# Patient Record
Sex: Male | Born: 1965
Health system: Southern US, Community
[De-identification: ages and names within clinical notes are randomized; demographics above are authoritative.]

## PROBLEM LIST (undated history)

## (undated) DIAGNOSIS — F329 Major depressive disorder, single episode, unspecified: Secondary | ICD-10-CM

## (undated) DIAGNOSIS — R519 Headache, unspecified: Secondary | ICD-10-CM

## (undated) DIAGNOSIS — E785 Hyperlipidemia, unspecified: Secondary | ICD-10-CM

## (undated) DIAGNOSIS — K219 Gastro-esophageal reflux disease without esophagitis: Secondary | ICD-10-CM

## (undated) DIAGNOSIS — R51 Headache: Secondary | ICD-10-CM

## (undated) DIAGNOSIS — M25519 Pain in unspecified shoulder: Secondary | ICD-10-CM

## (undated) DIAGNOSIS — F32A Depression, unspecified: Secondary | ICD-10-CM

## (undated) DIAGNOSIS — H52202 Unspecified astigmatism, left eye: Secondary | ICD-10-CM

## (undated) DIAGNOSIS — R5383 Other fatigue: Secondary | ICD-10-CM

## (undated) DIAGNOSIS — M6530 Trigger finger, unspecified finger: Secondary | ICD-10-CM

## (undated) DIAGNOSIS — G4730 Sleep apnea, unspecified: Secondary | ICD-10-CM

## (undated) DIAGNOSIS — M1990 Unspecified osteoarthritis, unspecified site: Secondary | ICD-10-CM

## (undated) DIAGNOSIS — M25569 Pain in unspecified knee: Secondary | ICD-10-CM

## (undated) DIAGNOSIS — J189 Pneumonia, unspecified organism: Secondary | ICD-10-CM

## (undated) DIAGNOSIS — F419 Anxiety disorder, unspecified: Secondary | ICD-10-CM

## (undated) DIAGNOSIS — D649 Anemia, unspecified: Secondary | ICD-10-CM

## (undated) DIAGNOSIS — I10 Essential (primary) hypertension: Secondary | ICD-10-CM

## (undated) DIAGNOSIS — E669 Obesity, unspecified: Secondary | ICD-10-CM

## (undated) HISTORY — DX: Essential (primary) hypertension: I10

## (undated) HISTORY — DX: Other fatigue: R53.83

## (undated) HISTORY — DX: Obesity, unspecified: E66.9

## (undated) HISTORY — DX: Sleep apnea, unspecified: G47.30

## (undated) HISTORY — DX: Pain in unspecified knee: M25.569

## (undated) HISTORY — DX: Trigger finger, unspecified finger: M65.30

## (undated) HISTORY — DX: Pain in unspecified shoulder: M25.519

## (undated) HISTORY — PX: OTHER SURGICAL HISTORY: SHX169

## (undated) HISTORY — DX: Unspecified osteoarthritis, unspecified site: M19.90

## (undated) HISTORY — PX: CARPAL TUNNEL RELEASE: SHX101

## (undated) HISTORY — PX: FRACTURE SURGERY: SHX138

## (undated) HISTORY — PX: KNEE ARTHROSCOPY: SUR90

## (undated) HISTORY — PX: SHOULDER SURGERY: SHX246

## (undated) HISTORY — PX: JOINT REPLACEMENT: SHX530

## (undated) HISTORY — DX: Hyperlipidemia, unspecified: E78.5

---

## 2000-12-23 ENCOUNTER — Ambulatory Visit (HOSPITAL_COMMUNITY): Admission: RE | Admit: 2000-12-23 | Discharge: 2000-12-23 | Payer: Self-pay | Admitting: Internal Medicine

## 2000-12-23 ENCOUNTER — Encounter: Payer: Self-pay | Admitting: Internal Medicine

## 2001-02-01 ENCOUNTER — Emergency Department (HOSPITAL_COMMUNITY): Admission: EM | Admit: 2001-02-01 | Discharge: 2001-02-01 | Payer: Self-pay | Admitting: Emergency Medicine

## 2001-02-01 ENCOUNTER — Encounter: Payer: Self-pay | Admitting: Emergency Medicine

## 2004-06-23 HISTORY — PX: SHOULDER SURGERY: SHX246

## 2004-11-08 ENCOUNTER — Ambulatory Visit (HOSPITAL_COMMUNITY): Admission: RE | Admit: 2004-11-08 | Discharge: 2004-11-08 | Payer: Self-pay | Admitting: Family Medicine

## 2009-06-23 HISTORY — PX: GASTRIC BYPASS: SHX52

## 2013-10-11 ENCOUNTER — Encounter: Payer: Self-pay | Admitting: Neurology

## 2013-10-12 ENCOUNTER — Ambulatory Visit (INDEPENDENT_AMBULATORY_CARE_PROVIDER_SITE_OTHER): Payer: BC Managed Care – PPO | Admitting: Neurology

## 2013-10-12 ENCOUNTER — Encounter: Payer: Self-pay | Admitting: Neurology

## 2013-10-12 ENCOUNTER — Encounter (INDEPENDENT_AMBULATORY_CARE_PROVIDER_SITE_OTHER): Payer: Self-pay

## 2013-10-12 VITALS — BP 129/78 | HR 60 | Temp 98.2°F | Ht 71.0 in | Wt 263.0 lb

## 2013-10-12 DIAGNOSIS — F1920 Other psychoactive substance dependence, uncomplicated: Secondary | ICD-10-CM

## 2013-10-12 DIAGNOSIS — F1120 Opioid dependence, uncomplicated: Secondary | ICD-10-CM

## 2013-10-12 DIAGNOSIS — G4733 Obstructive sleep apnea (adult) (pediatric): Secondary | ICD-10-CM

## 2013-10-12 DIAGNOSIS — G2581 Restless legs syndrome: Secondary | ICD-10-CM

## 2013-10-12 DIAGNOSIS — R400 Somnolence: Secondary | ICD-10-CM

## 2013-10-12 DIAGNOSIS — G4710 Hypersomnia, unspecified: Secondary | ICD-10-CM

## 2013-10-12 DIAGNOSIS — E669 Obesity, unspecified: Secondary | ICD-10-CM

## 2013-10-12 NOTE — Patient Instructions (Addendum)
Based on your symptoms and your exam I believe you are at risk for obstructive sleep apnea or OSA, and I think we should proceed with a sleep study to determine whether you do or do not have OSA and how severe it is. If you have more than mild OSA, I want you to consider treatment with CPAP. Please remember, the risks and ramifications of moderate to severe obstructive sleep apnea or OSA are: Cardiovascular disease, including congestive heart failure, stroke, difficult to control hypertension, arrhythmias, and even type 2 diabetes has been linked to untreated OSA. Sleep apnea causes disruption of sleep and sleep deprivation in most cases, which, in turn, can cause recurrent headaches, problems with memory, mood, concentration, focus, and vigilance. Most people with untreated sleep apnea report excessive daytime sleepiness, which can affect their ability to drive. Please do not drive if you feel sleepy.  I will see you back after your sleep study to go over the test results and where to go from there. We will call you after your sleep study and to set up an appointment at the time.   Bring all your night time medications.  

## 2013-10-12 NOTE — Progress Notes (Signed)
Subjective:    Patient ID: Mark [name] is a 48 y.o. male.  HPI    Huston Foley, MD, PhD [facility] [address], [address] [address] [place], [place] [address]  Dear Dr. Regino Schultze,   I saw your patient, Mark [name], upon your kind request in my neurologic clinic today for initial consultation of his prior diagnosis of sleep apnea. The patient is unaccompanied today. As you know, Mark [name] is a very pleasant 48 year old right-handed gentleman with an underlying medical history of chronic pain on Suboxone d/t narcotic addiction, allergic rhinitis, restless leg syndrome, depression and anxiety, vitamin D deficiency and morbid obesity who underwent gastric bypass surgery in 2011, and since then has been able to lose approximately 200 pounds. He was previously on BiPAP treatment for severe obstructive as well as central sleep apnea and has not been reevaluated for his sleep disordered breathing after his significant weight loss. He does endorse residual snoring and apneic pauses while asleep. He currently is still using a BiPAP machine, and has been able to get a new machine some 8 months ago. He forgot to bring his machine and does not know, what pressure he is on, and I do not have his prior test results for review, his study was in 2008. He has been on suboxone for 9 months and was on narcotics since 2012. He sees a psychiatrist for pain management. He tries to go to bed by 10 PM and wake time is 5 to 5:30 AM. He falls asleep very quickly and is sleepy during the day, with an ESS of 17/24. He has occasional morning headaches. He uses his BiPAP every night. He works from 7 AM to 5 to 6 PM. He does not tend to snore any more. He does have severe R knee pain and has aching in the legs. He tried Requip, but could not tolerate it.  He is a restless sleeper and in the morning, the bed is quite disheveled.   He denies cataplexy, sleep paralysis, hypnagogic or hypnopompic  hallucinations, or sleep attacks. He does not report any vivid dreams, nightmares, dream enactments, or parasomnias, such as sleep walking, but may talk in his sleep. The patient has not had a sleep study or a home sleep test since the original study and was on O2, but this was discontinued after an ONO post-gastric bypass.  He consumes 5 caffeinated beverages per day, usually in the form of coffee, usually not after lunch. His bedroom is usually dark and cool. There is TV in the bedroom and usually it is not on at night.   His Past Medical History Is Significant For: Past Medical History  Diagnosis Date  . Obesity, unspecified   . Unspecified sleep apnea   . Trigger finger (acquired)   . Fatigue     His Past Surgical History Is Significant For: Past Surgical History  Procedure Laterality Date  . Gastric bypass  2011  . Carpal tunnel release Bilateral   . Shoulder surgery Left 2006  . Shoulder surgery Right 2012  . Knee arthroplasty Right 2013    His Family History Is Significant For: Family History  Problem Relation Age of Onset  . Heart failure Mother   . Cancer Father     His Social History Is Significant For: History   Social History  . Marital Status: Married    Spouse Name: N/A    Number of Children: N/A  . Years of Education: N/A   Social  History Main Topics  . Smoking status: Never Smoker   . Smokeless tobacco: None  . Alcohol Use: No  . Drug Use: No  . Sexual Activity: None   Other Topics Concern  . None   Social History Narrative  . None    His Allergies Are:  Allergies  Allergen Reactions  . Asa [Aspirin]     gastric bypass   . Ibuprofen     gastric bypass  . Morphine And Related Nausea And Vomiting  . Penicillins Hives  :   His Current Medications Are:  Outpatient Encounter Prescriptions as of 10/12/2013  Medication Sig  . buprenorphine-naloxone (SUBOXONE) 8-2 MG SUBL SL tablet Place 1 tablet under the tongue daily.  Marland Kitchen. buPROPion  (WELLBUTRIN SR) 150 MG 12 hr tablet Take 150 mg by mouth 2 (two) times daily.  . Calcium Citrate-Vitamin D (CALCIUM CITRATE + PO) Take 2 tablets by mouth 2 (two) times daily.  . citalopram (CELEXA) 20 MG tablet Take 20 mg by mouth daily.  . diclofenac sodium (VOLTAREN) 1 % GEL Apply 2 g topically 4 (four) times daily.  . DULoxetine (CYMBALTA) 60 MG capsule Take 60 mg by mouth daily.  . Ergocalciferol (VITAMIN D2 PO) Take 5,000 mg by mouth 3 (three) times daily.  Marland Kitchen. glucosamine-chondroitin 500-400 MG tablet Take 1 tablet by mouth 3 (three) times daily.  . Iron 66 MG TABS Take 1 tablet by mouth daily.  . Multiple Vitamins-Minerals (MULTIVITAMIN PO) Take 1 tablet by mouth daily.  . Omega-3 Fatty Acids (FISH OIL) 1000 MG CAPS Take 1 capsule by mouth daily.  Marland Kitchen. scopolamine (TRANSDERM-SCOP) 1 MG/3DAYS Place 1 patch onto the skin every 3 (three) days.  . tadalafil (CIALIS) 10 MG tablet Take 10 mg by mouth daily as needed for erectile dysfunction.  . [DISCONTINUED] fluticasone (FLONASE) 50 MCG/ACT nasal spray Place into both nostrils daily.  . [DISCONTINUED] gabapentin (NEURONTIN) 300 MG capsule Take 300 mg by mouth 3 (three) times daily.  . [DISCONTINUED] levofloxacin (LEVAQUIN) 500 MG tablet Take 500 mg by mouth daily.  . [DISCONTINUED] predniSONE (DELTASONE) 5 MG tablet Take 5 mg by mouth daily with breakfast.  . [DISCONTINUED] rOPINIRole (REQUIP) 2 MG tablet Take 2 mg by mouth at bedtime.  :  Review of Systems:  Out of a complete 14 point review of systems, all are reviewed and negative with the exception of these symptoms as listed below:  Review of Systems  Constitutional: Positive for fatigue.  Eyes: Positive for visual disturbance (blurred vision, diplopia).  Respiratory: Positive for apnea (snoring).   Cardiovascular: Negative.   Gastrointestinal: Positive for constipation.  Endocrine: Negative.   Genitourinary: Positive for difficulty urinating.  Musculoskeletal: Positive for  arthralgias, joint swelling and myalgias.  Skin: Negative.   Allergic/Immunologic: Positive for environmental allergies and immunocompromised state (frequent infections).  Neurological: Positive for numbness and headaches.  Hematological: Negative.   Psychiatric/Behavioral: Positive for sleep disturbance (e.d.s., restless leg) and dysphoric mood.    Objective:  Neurologic Exam  Physical Exam Physical Examination:   Filed Vitals:   10/12/13 0826  BP: 129/78  Pulse: 60  Temp: 98.2 F (36.8 C)    General Examination: The patient is a very pleasant 48 y.o. male in no acute distress. He appears well-developed and well-nourished and well groomed. He is overweight.  HEENT: Normocephalic, atraumatic, pupils are equal, round and reactive to light and accommodation. Funduscopic exam is normal with sharp disc margins noted. Extraocular tracking is good without limitation to gaze excursion or nystagmus  noted. Normal smooth pursuit is noted. Hearing is grossly intact. Tympanic membranes are clear bilaterally. Face is symmetric with normal facial animation and normal facial sensation. Speech is clear with no dysarthria noted. There is no hypophonia. There is no lip, neck/head, jaw or voice tremor. Neck is supple with full range of passive and active motion. There are no carotid bruits on auscultation. Oropharynx exam reveals: mild mouth dryness, adequate dental hygiene and moderate airway crowding, due to narrow airway entry, longer uvula and tonsils in place. Mallampati is class II. Tongue protrudes centrally and palate elevates symmetrically. Tonsils are 2+ in size. Neck size is 18.75 inches.   Chest: Clear to auscultation without wheezing, rhonchi or crackles noted.  Heart: S1+S2+0, regular and normal without murmurs, rubs or gallops noted.   Abdomen: Soft, non-tender and non-distended with normal bowel sounds appreciated on auscultation.  Extremities: There is no pitting edema in the distal  lower extremities bilaterally. Pedal pulses are intact.  Skin: Warm and dry without trophic changes noted. There are no varicose veins.  Musculoskeletal: exam reveals no obvious joint deformities, tenderness or joint swelling or erythema, with the exception of right knee pain and bilateral leg aching.   Neurologically:  Mental status: The patient is awake, alert and oriented in all 4 spheres. His immediate and remote memory, attention, language skills and fund of knowledge are appropriate. There is no evidence of aphasia, agnosia, apraxia or anomia. Speech is clear with normal prosody and enunciation. Thought process is linear. Mood is anxious and affect is normal.  Cranial nerves II - XII are as described above under HEENT exam. In addition: shoulder shrug is normal with equal shoulder height noted. Motor exam: Normal bulk, strength and tone is noted. There is no drift, tremor or rebound. Romberg is negative. Reflexes are 2+ throughout. Babinski: Toes are flexor bilaterally. Fine motor skills and coordination: intact with normal finger taps, normal hand movements, normal rapid alternating patting, normal foot taps and normal foot agility.  Cerebellar testing: No dysmetria or intention tremor on finger to nose testing. Heel to shin is unremarkable bilaterally. There is no truncal or gait ataxia.  Sensory exam: intact to light touch, pinprick, vibration, temperature sense in the upper and lower extremities.  Gait, station and balance: He stands with difficulty d/t pain in legs. No veering to one side is noted. No leaning to one side is noted. Posture is age-appropriate and stance is narrow based. Gait shows normal stride length and normal pace. No problems turning are noted. He turns en bloc. Tandem walk is very slightly difficult for him. Intact toe and heel stance is noted.               Assessment and Plan:   In summary, Mark [name] is a very pleasant 48 y.o.-year old male  with an underlying  medical history of chronic pain on Suboxone d/t narcotic addiction, allergic rhinitis, restless leg syndrome, depression and anxiety, vitamin D deficiency and morbid obesity,  who was diagnosed in 2008 with obstructive sleep apnea, perhaps also central sleep apnea. Unfortunately, I do not have prior test results available for review. He is on BiPAP treatment and has been able to get a new machine has not had a change in this pressure setting since his significant weight loss after his gastric bypass surgery in 2011. He does endorse significant daytime somnolence. This may be in part due to medication effect. He is advised that he will need a repeat sleep study most likely  in the form of a split-night sleep study. Furthermore, he also endorses restless leg symptoms. He's not sure if he kicks in his sleep. He tried Requip but did not tolerate it. Of note, he is on Cymbalta as well as Celexa. I've advised him to talk to his psychiatrist about potentially consolidating treatment. He is advised to bring all his nighttime medications for the test. I had a long chat with the patient about my findings and the diagnosis of OSA, its prognosis and treatment options. We talked about medical treatments, surgical interventions and non-pharmacological approaches. I explained in particular the risks and ramifications of untreated moderate to severe OSA, especially with respect to developing cardiovascular disease down the Road, including congestive heart failure, difficult to treat hypertension, cardiac arrhythmias, or stroke. Even type 2 diabetes has, in part, been linked to untreated OSA. Symptoms of untreated OSA include daytime sleepiness, memory problems, mood irritability and mood disorder such as depression and anxiety, lack of energy, as well as recurrent headaches, especially morning headaches. We talked about trying to maintain a healthy lifestyle in general, as well as the importance of weight control. I encouraged the  patient to eat healthy, exercise daily and keep well hydrated, to keep a scheduled bedtime and wake time routine, to not skip any meals and eat healthy snacks in between meals. I advised the patient not to drive when feeling sleepy. I recommended the following at this time: sleep study with potential positive airway pressure titration.  I explained the sleep test procedure to the patient and also outlined possible surgical and non-surgical treatment options of OSA, including the use of a custom-made dental device (which would require a referral to a specialist dentist or oral surgeon), upper airway surgical options, such as pillar implants, radiofrequency surgery, tongue base surgery, and UPPP (which would involve a referral to an ENT surgeon). Rarely, jaw surgery such as mandibular advancement may be considered.  I also explained the CPAP treatment option to the patient, who indicated that he would be willing to try CPAP or BiPAP if the need arises. I explained the importance of being compliant with PAP treatment, not only for insurance purposes but primarily to improve His symptoms, and for the patient's long term health benefit, including to reduce His cardiovascular risks. I answered all his questions today and the patient was in agreement. I would like to see him back after the sleep study is completed and encouraged him to call with any interim questions, concerns, problems or updates.   Thank you very much for allowing me to participate in the care of this nice patient. If I can be of any further assistance to you please do not hesitate to call me at [phone].  Sincerely,   Huston Foley, MD, PhD

## 2013-11-16 ENCOUNTER — Ambulatory Visit (INDEPENDENT_AMBULATORY_CARE_PROVIDER_SITE_OTHER): Payer: BC Managed Care – PPO | Admitting: Neurology

## 2013-11-16 DIAGNOSIS — F1120 Opioid dependence, uncomplicated: Secondary | ICD-10-CM

## 2013-11-16 DIAGNOSIS — G4733 Obstructive sleep apnea (adult) (pediatric): Secondary | ICD-10-CM

## 2013-11-16 DIAGNOSIS — E669 Obesity, unspecified: Secondary | ICD-10-CM

## 2013-11-30 ENCOUNTER — Telehealth: Payer: Self-pay | Admitting: Neurology

## 2013-11-30 DIAGNOSIS — G4730 Sleep apnea, unspecified: Secondary | ICD-10-CM

## 2013-11-30 DIAGNOSIS — G4710 Hypersomnia, unspecified: Secondary | ICD-10-CM

## 2013-11-30 DIAGNOSIS — G4733 Obstructive sleep apnea (adult) (pediatric): Secondary | ICD-10-CM

## 2013-11-30 NOTE — Telephone Encounter (Signed)
I called and left a message for the patient about his recent sleep study results. I informed the patient that the study confirmed the diagnosis of obstructive sleep apnea and that he did well on CPAP during the night of the study with significant improvement of respiratory events. Dr. Frances Furbish recommend starting CPAP therapy at home. I will send the order to a durable medical equipment company of the patient choice. I will fax a copy to Dr. Lorie Apley office and mail a copy to the patient along with a follow up instruction letter.

## 2013-11-30 NOTE — Telephone Encounter (Signed)
Please call and notify patient that the recent sleep study confirmed the diagnosis of OSA. He did very well with CPAP during the study with significant improvement of the respiratory events. Therefore, I would like start the patient on CPAP at home. I placed the order in the chart.   Arrange for CPAP set up at home through a DME company of patient's choice and fax/route report to PCP and referring MD (if other than PCP).   The patient will also need a follow up appointment with me in 6-8 weeks post set up that has to be scheduled; help the patient schedule this (in a follow-up slot).   Please re-enforce the importance of compliance with treatment and the need for us to monitor compliance data.   Once you have spoken to the patient and scheduled the return appointment, you may close this encounter, thanks,   Terilyn Sano, MD, PhD [facility]    

## 2013-12-08 ENCOUNTER — Encounter: Payer: Self-pay | Admitting: *Deleted

## 2013-12-08 NOTE — Telephone Encounter (Signed)
I called and spoke with the patient to get the name of the DME company he would like to send his order. I also gave him the results of his sleep study report.

## 2014-06-05 ENCOUNTER — Other Ambulatory Visit (HOSPITAL_COMMUNITY): Payer: Self-pay | Admitting: Internal Medicine

## 2014-06-05 DIAGNOSIS — L720 Epidermal cyst: Secondary | ICD-10-CM

## 2014-06-05 DIAGNOSIS — N5089 Other specified disorders of the male genital organs: Secondary | ICD-10-CM

## 2014-06-07 ENCOUNTER — Ambulatory Visit (HOSPITAL_COMMUNITY): Admission: RE | Admit: 2014-06-07 | Payer: BC Managed Care – PPO | Source: Ambulatory Visit

## 2014-06-08 ENCOUNTER — Ambulatory Visit (HOSPITAL_COMMUNITY)
Admission: RE | Admit: 2014-06-08 | Discharge: 2014-06-08 | Disposition: A | Discharge status: Home / Self Care | Payer: BC Managed Care – PPO | Source: Ambulatory Visit | Attending: Internal Medicine | Admitting: Internal Medicine

## 2014-06-08 DIAGNOSIS — N433 Hydrocele, unspecified: Secondary | ICD-10-CM | POA: Insufficient documentation

## 2014-06-08 DIAGNOSIS — N508 Other specified disorders of male genital organs: Secondary | ICD-10-CM | POA: Diagnosis present

## 2014-06-08 DIAGNOSIS — L720 Epidermal cyst: Secondary | ICD-10-CM

## 2014-06-08 DIAGNOSIS — N5089 Other specified disorders of the male genital organs: Secondary | ICD-10-CM

## 2014-06-14 ENCOUNTER — Encounter: Payer: Self-pay | Admitting: *Deleted

## 2014-06-14 DIAGNOSIS — E785 Hyperlipidemia, unspecified: Secondary | ICD-10-CM

## 2014-06-14 DIAGNOSIS — M170 Bilateral primary osteoarthritis of knee: Secondary | ICD-10-CM

## 2014-06-14 DIAGNOSIS — M1990 Unspecified osteoarthritis, unspecified site: Secondary | ICD-10-CM | POA: Insufficient documentation

## 2014-06-14 DIAGNOSIS — G8929 Other chronic pain: Secondary | ICD-10-CM

## 2014-06-14 DIAGNOSIS — R079 Chest pain, unspecified: Secondary | ICD-10-CM | POA: Insufficient documentation

## 2014-06-14 DIAGNOSIS — G4730 Sleep apnea, unspecified: Secondary | ICD-10-CM | POA: Insufficient documentation

## 2014-06-14 DIAGNOSIS — R0789 Other chest pain: Secondary | ICD-10-CM | POA: Insufficient documentation

## 2014-06-14 DIAGNOSIS — I10 Essential (primary) hypertension: Secondary | ICD-10-CM

## 2014-06-19 ENCOUNTER — Ambulatory Visit (INDEPENDENT_AMBULATORY_CARE_PROVIDER_SITE_OTHER): Payer: BC Managed Care – PPO | Admitting: Internal Medicine

## 2014-06-19 ENCOUNTER — Encounter: Payer: Self-pay | Admitting: Internal Medicine

## 2014-06-19 VITALS — BP 128/80 | HR 76 | Ht 70.0 in | Wt 290.6 lb

## 2014-06-19 DIAGNOSIS — I10 Essential (primary) hypertension: Secondary | ICD-10-CM

## 2014-06-19 NOTE — Progress Notes (Signed)
HPI Patinet is a 48 yo who is referred for evaluation of CP  Patinet has sleep apnea  Uses CPAP   Before when woke up (prior to CPAP) would wake up with CP  With CPAP he has intermittent pain.  SOmetimes lasts a few hours  Dull  Not bad   Happesn duringday  Can occur with and without activty Says he is fairly active.  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   Had gastric bypass. Allergies  Allergen Reactions  . Asa [Aspirin]     gastric bypass   . Ibuprofen     gastric bypass  . Morphine And Related Nausea And Vomiting  . Penicillins Hives    Current Outpatient Prescriptions  Medication Sig Dispense Refill  . buprenorphine-naloxone (SUBOXONE) 8-2 MG SUBL SL tablet Place 1 tablet under the tongue daily.    Marland Kitchen. buPROPion (WELLBUTRIN SR) 150 MG 12 hr tablet Take 150 mg by mouth 2 (two) times daily.    . Calcium Citrate-Vitamin D (CALCIUM CITRATE + PO) Take 2 tablets by mouth 2 (two) times daily.    . citalopram (CELEXA) 20 MG tablet Take 20 mg by mouth daily.    . diclofenac sodium (VOLTAREN) 1 % GEL Apply 2 g topically 4 (four) times daily.    . DULoxetine (CYMBALTA) 60 MG capsule Take 60 mg by mouth daily.    . Ergocalciferol (VITAMIN D2 PO) Take 5,000 mg by mouth 3 (three) times daily.    Marland Kitchen. glucosamine-chondroitin 500-400 MG tablet Take 1 tablet by mouth 3 (three) times daily.    . Iron 66 MG TABS Take 1 tablet by mouth daily.    . Multiple Vitamins-Minerals (MULTIVITAMIN PO) Take 1 tablet by mouth daily.    . Omega-3 Fatty Acids (FISH OIL) 1000 MG CAPS Take 1 capsule by mouth daily.    Marland Kitchen. scopolamine (TRANSDERM-SCOP) 1 MG/3DAYS Place 1 patch onto the skin every 3 (three) days.    . tadalafil (CIALIS) 10 MG tablet Take 10 mg by mouth daily as needed for erectile dysfunction.     No current facility-administered medications for this visit.    Past Medical History  Diagnosis Date  . Obesity, unspecified   . Unspecified sleep apnea   . Trigger finger (acquired)   . Fatigue     Past  Surgical History  Procedure Laterality Date  . Gastric bypass  2011  . Carpal tunnel release Bilateral   . Shoulder surgery Left 2006  . Shoulder surgery Right 2012  . Knee arthroplasty Right 2013    Family History  Problem Relation Age of Onset  . Heart failure Mother   . Cancer Father     History   Social History  . Marital Status: Married    Spouse Name: N/A    Number of Children: N/A  . Years of Education: N/A   Occupational History  . Not on file.   Social History Main Topics  . Smoking status: Never Smoker   . Smokeless tobacco: Not on file  . Alcohol Use: No  . Drug Use: No  . Sexual Activity: Not on file   Other Topics Concern  . Not on file   Social History Narrative    Review of Systems:  All systems reviewed.  They are negative to the above problem except as previously stated.  Vital Signs: BP 128/80 mmHg  Pulse 76  Ht 5\' 10"  (1.778 m)  Wt 290 lb 9.6 oz (131.815 kg)  BMI 41.70 kg/m2  Physical  Exam Patient is a morbidly obese 48 yo in NAD  HEENT:  Normocephalic, atraumatic. EOMI, PERRLA.  Neck: JVP is normal.  No bruits.  Lungs: clear to auscultation. No rales no wheezes.  Heart: Regular rate and rhythm. Normal S1, S2. No S3.   No significant murmurs. PMI not displaced.  Abdomen:  Supple, nontender. Normal bowel sounds. No masses. No hepatomegaly.  Extremities:   Good distal pulses throughout. No lower extremity edema.  Musculoskeletal :moving all extremities.  Neuro:   alert and oriented x3.  CN II-XII grossly intact.  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  Would continue to use . I would recomm stress testing given above symptoms  I think a false positive is more likely in his case given  size/difficulties  I would not want to go on to cath   I encouraged him to stay active  F/U if symtpoms change  2.  Sleep apnea  Needs to use BiPAP  3.  Morbid obesity  Patinet knows he needs to lose wt.

## 2014-06-19 NOTE — Patient Instructions (Signed)
Your physician recommends that you schedule a follow-up appointment in: only as needed.       Thank you for choosing [facility] !         

## 2014-11-15 HISTORY — PX: KNEE ARTHROSCOPY: SUR90

## 2015-03-05 ENCOUNTER — Other Ambulatory Visit: Payer: Self-pay | Admitting: Orthopedic Surgery

## 2015-03-05 DIAGNOSIS — M25512 Pain in left shoulder: Principal | ICD-10-CM

## 2015-03-05 DIAGNOSIS — M25511 Pain in right shoulder: Secondary | ICD-10-CM

## 2015-03-19 ENCOUNTER — Ambulatory Visit
Admission: RE | Admit: 2015-03-19 | Discharge: 2015-03-19 | Disposition: A | Discharge status: Home / Self Care | Payer: BLUE CROSS/BLUE SHIELD | Source: Ambulatory Visit | Attending: Orthopedic Surgery | Admitting: Orthopedic Surgery

## 2015-03-19 DIAGNOSIS — M25511 Pain in right shoulder: Secondary | ICD-10-CM

## 2015-03-19 DIAGNOSIS — M25512 Pain in left shoulder: Principal | ICD-10-CM

## 2015-04-10 ENCOUNTER — Telehealth: Payer: Self-pay

## 2015-04-10 NOTE — Telephone Encounter (Signed)
PT left VM to call about scheduling colonoscopy.

## 2015-04-11 NOTE — Telephone Encounter (Signed)
Called pt at [phone] and [facility] for a return call.

## 2015-04-11 NOTE — Telephone Encounter (Signed)
Pt called. He is supposed to have left knee replacement soon. 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 He is concerned about anesthesia twice close together. He is on pain meds etc and needs OV. I have scheduled OV with Tana CoastLeslie Lewis, PA for 05/02/2015 at 2:00 PM. I told him if he thinks he will have the other surgery and needs to reschedule that is fine. He said the surgeon that will be doing him is usually scheduled out for about 4 weeks. Routing to [place] to any advise!

## 2015-04-12 NOTE — Telephone Encounter (Signed)
Pt's wife is aware of the appt on 05/02/2015 at 2:00 pm and will call if he needs to do the knee first and wait.

## 2015-04-12 NOTE — Telephone Encounter (Signed)
Needs OV prior to procedure.   Would try to get colonoscopy done before knee replacement if possible or consider waiting until completely recovered.

## 2015-05-02 ENCOUNTER — Ambulatory Visit: Payer: BLUE CROSS/BLUE SHIELD | Admitting: Gastroenterology

## 2015-05-21 ENCOUNTER — Ambulatory Visit: Payer: BLUE CROSS/BLUE SHIELD | Admitting: Gastroenterology

## 2015-06-06 ENCOUNTER — Ambulatory Visit (INDEPENDENT_AMBULATORY_CARE_PROVIDER_SITE_OTHER): Payer: BLUE CROSS/BLUE SHIELD | Admitting: Neurology

## 2015-06-06 ENCOUNTER — Encounter: Payer: Self-pay | Admitting: Neurology

## 2015-06-06 VITALS — BP 148/78 | HR 80 | Resp 18 | Ht 70.0 in | Wt 288.0 lb

## 2015-06-06 DIAGNOSIS — E669 Obesity, unspecified: Secondary | ICD-10-CM

## 2015-06-06 DIAGNOSIS — G4733 Obstructive sleep apnea (adult) (pediatric): Secondary | ICD-10-CM

## 2015-06-06 NOTE — Patient Instructions (Signed)
I will prescribe CPAP again for you. We will fax the order to [facility].   Please use CPAP regularly. While your insurance requires that you use CPAP at least 4 hours each night on 70% of the nights, I recommend, that you not skip any nights and use it throughout the night if you can. Getting used to CPAP and staying with the treatment long term does take time and patience and discipline. Untreated obstructive sleep apnea when it is moderate to severe can have an adverse impact on cardiovascular health and raise her risk for heart disease, arrhythmias, hypertension, congestive heart failure, stroke and diabetes. Untreated obstructive sleep apnea causes sleep disruption, nonrestorative sleep, and sleep deprivation. This can have an impact on your day to day functioning and cause daytime sleepiness and impairment of cognitive function, memory loss, mood disturbance, and problems focussing. Using CPAP regularly can improve these symptoms.

## 2015-06-06 NOTE — Progress Notes (Signed)
Subjective:    Patient ID: Mark [name] is a 49 y.o. male.  HPI     Interim history:   Mark [name] is a very pleasant 49 year old right-handed gentleman with an underlying medical history of chronic pain on Suboxone d/t narcotic addiction, allergic rhinitis, restless leg syndrome, depression and anxiety, vitamin D deficiency and morbid obesity who presents for follow-up consultation of his obstructive sleep apnea. He is re-referred by his primary care physician, Dr. Gerarda Fraction. The patient is unaccompanied today. I first met him on 10/12/2013 at the request of his primary care physician, at which time the patient reported a prior diagnosis of obstructive sleep apnea for which he was on BiPAP therapy and significant weight loss after his gastric bypass surgery in 2011. I invited him back for sleep study for reevaluation of his OSA. He had a split-night sleep study on 11/16/2013 and I went over his test results with him in detail today. Baseline sleep efficiency was 87.2% with a latency to sleep of 11.5 minutes and wake after sleep onset of 7.5 minutes with mild sleep fragmentation noted. He had near absence of slow-wave sleep. REM sleep was 49.8% with a reduced REM latency of 12.5 minutes. He had no significant PLMS, EKG or EEG changes. Mild snoring was noted. He slept mostly on his back. Total AHI was 25.9 per hour, average oxygen saturation 93%, nadir was 85% with time below 90% saturation of 17 minutes and 28 seconds. He was then titrated on CPAP. Sleep efficiency during the treatment portion of the study was 83% with a latency to sleep of 1 minute and wake after sleep onset of 39.5 minutes with mild to moderate sleep fragmentation noted. There was absence of slow-wave sleep and REM was 25.5% with a long REM latency. Average oxygen saturation was 95% with a nadir of 91%. Snoring was eliminated. CPAP was titrated from 5 cm to 7 cm with a reduction in the AHI to 0 per hour on the final pressure. Supine REM  sleep was achieved. Based on his test results I prescribed home CPAP therapy at the pressure of 7 cm. Unfortunately, the patient did not return for follow-up until now.   Today, 06/06/2015: I reviewed his auto BiPAP compliance data from 05/07/2015 through 06/05/2015 which is a total of 30 days during which time he used his machine only 22 days with percent used days greater than 4 hours at 23%, indicating poor compliance. Average AHI 0.5 per hour, leak at times high with the 95th percentile at 33.3 L/m. In the last 90 days from 03/08/2015 through 06/05/2015 he was about 30% compliant with treatment. Leak was also consistently high.  Today, 06/06/2015: He reports difficulty with his BiPAP machine and the mask does not fit well. He never got the CPAP that I ordered for unclear reasons. The patient was contacted in June 2015 and we faxed the order to his DME company at the time. At any rate, he tries to be compliant with his BiPAP but is not fully compliant. He does not sleep very well. He does not wake up well rested. He had a left knee injury in the interim and needs surgery. He also reports right knee pain and bilateral shoulder pain. Unfortunately, since his sleep study from May 2015 he has gained about 25 pounds.  Previously:   10/12/2013: He had significant weight loss after his gastric bypass surgery in 2011, and since then has been able to lose approximately 200 pounds. He was previously on BiPAP  treatment for severe obstructive as well as central sleep apnea and has not been reevaluated for his sleep disordered breathing after his significant weight loss. He does endorse residual snoring and apneic pauses while asleep. He currently is still using a BiPAP machine, and has been able to get a new machine some 8 months ago. He forgot to bring his machine and does not know, what pressure he is on, and I do not have his prior test results for review, his study was in 2008. He has been on suboxone for 9  months and was on narcotics since 2012. He sees a psychiatrist for pain management. He tries to go to bed by 10 PM and wake time is 5 to 5:30 AM. He falls asleep very quickly and is sleepy during the day, with an ESS of 17/24. He has occasional morning headaches. He uses his BiPAP every night. He works from 7 AM to 5 to 6 PM. He does not tend to snore any more. He does have severe R knee pain and has aching in the legs. He tried Requip, but could not tolerate it.   He is a restless sleeper and in the morning, the bed is quite disheveled.    He denies cataplexy, sleep paralysis, hypnagogic or hypnopompic hallucinations, or sleep attacks. He does not report any vivid dreams, nightmares, dream enactments, or parasomnias, such as sleep walking, but may talk in his sleep. The patient has not had a sleep study or a home sleep test since the original study and was on O2, but this was discontinued after an ONO post-gastric bypass.   He consumes 5 caffeinated beverages per day, usually in the form of coffee, usually not after lunch. His bedroom is usually dark and cool. There is TV in the bedroom and usually it is not on at night.   Her Past Medical History Is Significant For: Past Medical History  Diagnosis Date  . Obesity, unspecified   . Unspecified sleep apnea   . Trigger finger (acquired)   . Fatigue   . Osteoarthritis   . Hypertension   . Hyperlipemia   . Knee pain   . Shoulder pain     His Past Surgical History Is Significant For: Past Surgical History  Procedure Laterality Date  . Gastric bypass  2011  . Carpal tunnel release Bilateral   . Shoulder surgery Left 2006  . Shoulder surgery Right 2012  . Knee arthroplasty Right 2013    His Family History Is Significant For: Family History  Problem Relation Age of Onset  . Heart failure Mother   . Diabetes Mother   . COPD Mother   . Cancer Father   . Arthritis Father     His Social History Is Significant For: Social History    Social History  . Marital Status: Married    Spouse Name: N/A  . Number of Children: 3  . Years of Education: HS   Occupational History  . [employer]    Social History Main Topics  . Smoking status: Never Smoker   . Smokeless tobacco: None  . Alcohol Use: 0.0 oz/week    0 Standard drinks or equivalent per week     Comment: 12 drinks a year  . Drug Use: No  . Sexual Activity: Not Asked   Other Topics Concern  . None   Social History Narrative   Drinks 10 cups of coffee     His Allergies Are:  Allergies  Allergen Reactions  .  Asa [Aspirin]     gastric bypass   . Ibuprofen     gastric bypass  . Morphine And Related Nausea And Vomiting  . Penicillins Hives  :   His Current Medications Are:  Outpatient Encounter Prescriptions as of 06/06/2015  Medication Sig  . buPROPion (WELLBUTRIN SR) 150 MG 12 hr tablet Take 150 mg by mouth 2 (two) times daily.  . DULoxetine (CYMBALTA) 30 MG capsule Take 30 mg by mouth daily.  . DULoxetine (CYMBALTA) 60 MG capsule Take 60 mg by mouth daily.  . Ergocalciferol (VITAMIN D2 PO) Take 5,000 mg by mouth 3 (three) times daily.  . FentaNYL 37.5 MCG/HR PT72 apply 1 PATCH EVERY 3 DAYS AS DIRECTED  . Iron 66 MG TABS Take 1 tablet by mouth daily.  . Multiple Vitamins-Minerals (MULTIVITAMIN PO) Take 1 tablet by mouth daily.  Marland Kitchen oxyCODONE-acetaminophen (PERCOCET) 10-325 MG tablet Take 1 tablet by mouth every 4 (four) hours as needed.  . [DISCONTINUED] buprenorphine-naloxone (SUBOXONE) 8-2 MG SUBL SL tablet Place 1 tablet under the tongue daily.  . [DISCONTINUED] diclofenac sodium (VOLTAREN) 1 % GEL Apply 2 g topically 4 (four) times daily.  . [DISCONTINUED] HYDROmorphone (DILAUDID) 4 MG tablet TAKE 1 TABLET BY MOUTH EVERY 4 HOURS AS NEEDED. STOP taking percocet.  . [DISCONTINUED] rimantadine (FLUMADINE) 100 MG tablet Take 100 mg by mouth 2 (two) times daily.   No facility-administered encounter medications on file as of 06/06/2015.   :  Review of Systems:  Out of a complete 14 point review of systems, all are reviewed and negative with the exception of these symptoms as listed below:  Review of Systems  Constitutional: Positive for fatigue.  Musculoskeletal:       Joint pain and swelling   Allergic/Immunologic: Positive for environmental allergies.  Neurological: Positive for headaches.       Sleepiness and snoring   Psychiatric/Behavioral:       Depression and not enough sleep     Objective:  Neurologic Exam  Physical Exam Physical Examination:   Filed Vitals:   06/06/15 0907  BP: 148/78  Pulse: 80  Resp: 18   General Examination: The patient is a very pleasant 49 y.o. male in no acute distress. He appears well-developed and well-nourished and well groomed. He is obese.   HEENT: Normocephalic, atraumatic, pupils are equal, round and reactive to light and accommodation. Funduscopic exam is normal with sharp disc margins noted. Extraocular tracking is good without limitation to gaze excursion or nystagmus noted. Normal smooth pursuit is noted. Hearing is grossly intact. Tympanic membranes are clear bilaterally. Face is symmetric with normal facial animation and normal facial sensation. Speech is clear with no dysarthria noted. There is no hypophonia. There is no lip, neck/head, jaw or voice tremor. Neck is supple with full range of passive and active motion. There are no carotid bruits on auscultation. Oropharynx exam reveals: mild mouth dryness, mild pharyngeal erythema. He has adequate dental hygiene and moderate airway crowding, due to narrow airway entry, longer uvula and tonsils in place. Mallampati is class II. Tongue protrudes centrally and palate elevates symmetrically. Tonsils are 2+ in size.    Chest: Clear to auscultation without wheezing, rhonchi or crackles noted.  Heart: S1+S2+0, regular and normal without murmurs, rubs or gallops noted.   Abdomen: Soft, non-tender and non-distended with normal  bowel sounds appreciated on auscultation.  Extremities: There is no pitting edema in the distal lower extremities bilaterally.   Skin: Warm and dry without trophic changes noted. There  are no varicose veins.  Musculoskeletal: He reports bilateral shoulder pain and has somewhat decrease in range of motion in both shoulders, he reports right knee pain and left knee pain. He wears a brace around his left knee.   Neurologically:  Mental status: The patient is awake, alert and oriented in all 4 spheres. His immediate and remote memory, attention, language skills and fund of knowledge are appropriate. There is no evidence of aphasia, agnosia, apraxia or anomia. Speech is clear with normal prosody and enunciation. Thought process is linear. Mood is anxious and affect is normal.  Cranial nerves II - XII are as described above under HEENT exam. Motor exam: Normal bulk, strength and tone is noted, with the exception of limited range of motion in both knees and both shoulders. There is no drift, tremor or rebound. Romberg is not possible to test as he is not able to stand narrow-based. He brought a cane. He walks with a mild limp. Reflexes are 2+ in the upper extremities. Fine motor skills are intact in the upper extremities, heel to shin is not possible for him. Tandem walk is not possible for him. Sensory exam is intact to light touch.   Assessment and Plan:   In summary, Mark [name] is a very pleasant 49 year old male  with an underlying medical history of chronic pain, (previously on Suboxone d/t narcotic addiction), allergic rhinitis, restless leg syndrome, depression and anxiety, vitamin D deficiency, recent L knee injury and need for L TKA and obesity, who presents for follow-up consultation of his obstructive sleep apnea, after a long time. I originally saw him in April 2015 at which time I invited him back for sleep study. He had a split-night sleep study in May 2015 and we talked about the test  results today. He never established CPAP therapy which was recommended at the time. Unfortunately, he has also gained about 25 pounds in the interim. I suggested we do try to get him on CPAP therapy. I talked to him about his split-night sleep study results in detail as well as the risks and ramifications of under or untreated obstructive sleep apnea especially with respect to cardiovascular disease and complications down the road. He is advised to strive for weight loss and be completely compliant with treatment. He is currently not fully compliant with his auto BiPAP. I will prescribe the pressure of 7 cm via CPAP and nasal pillows which was recommended based on his split-night sleep study from May 2015. Given his weight gain he may require a higher pressure but we will have to see a compliance download first. I talked to him about his compliance as well.  I also explained the importance of being compliant with PAP treatment, not only for insurance purposes but primarily to improve His symptoms, and for the patient's long term health benefit, including to reduce His cardiovascular risks. I would like to see him back in about 3-4 months, sooner if needed. I answered all his questions today and the patient was in agreement.  I spent 20 minutes in total face-to-face time with the patient, more than 50% of which was spent in counseling and coordination of care, reviewing test results, reviewing medication and discussing or reviewing the diagnosis of OSA, its prognosis and treatment options.

## 2015-06-11 ENCOUNTER — Ambulatory Visit: Payer: BLUE CROSS/BLUE SHIELD | Admitting: Gastroenterology

## 2015-07-10 ENCOUNTER — Ambulatory Visit: Payer: Self-pay | Admitting: Physician Assistant

## 2015-07-10 NOTE — H&P (Signed)
TOTAL KNEE ADMISSION H&P  Patient is being admitted for left total knee arthroplasty.  Subjective:  Chief Complaint:left knee pain.  HPI: Mark [name], 50 y.o. male, has a history of pain and functional disability in the left knee due to arthritis and has failed non-surgical conservative treatments for greater than 12 weeks to includeNSAID's and/or analgesics, corticosteriod injections, viscosupplementation injections, use of assistive devices, weight reduction as appropriate and activity modification.  Onset of symptoms was gradual, starting 3 years ago with gradually worsening course since that time. The patient noted prior procedures on the knee to include  arthroscopy and menisectomy on the left knee(s).  Patient currently rates pain in the left knee(s) at 10 out of 10 with activity. Patient has night pain, worsening of pain with activity and weight bearing, pain that interferes with activities of daily living, pain with passive range of motion, crepitus and joint swelling.  Patient has evidence of periarticular osteophytes and joint space narrowing by imaging studies.There is no active infection.  Patient Active Problem List   Diagnosis Date Noted  . Hypertension 06/14/2014  . Hyperlipemia 06/14/2014  . Osteoarthritis 06/14/2014  . Chronic pain 06/14/2014  . Sleep apnea 06/14/2014  . Pain in the chest 06/14/2014   Past Medical History  Diagnosis Date  . Obesity, unspecified   . Unspecified sleep apnea   . Trigger finger (acquired)   . Fatigue   . Osteoarthritis   . Hypertension   . Hyperlipemia   . Knee pain   . Shoulder pain     Past Surgical History  Procedure Laterality Date  . Gastric bypass  2011  . Carpal tunnel release Bilateral   . Shoulder surgery Left 2006  . Shoulder surgery Right 2012  . Knee arthroplasty Right 2013     (Not in a hospital admission) Allergies  Allergen Reactions  . Asa [Aspirin]     gastric bypass   . Ibuprofen     gastric bypass  .  Morphine And Related Nausea And Vomiting  . Penicillins Hives    Social History  Substance Use Topics  . Smoking status: Never Smoker   . Smokeless tobacco: Not on file  . Alcohol Use: 0.0 oz/week    0 Standard drinks or equivalent per week     Comment: 12 drinks a year    Family History  Problem Relation Age of Onset  . Heart failure Mother   . Diabetes Mother   . COPD Mother   . Cancer Father   . Arthritis Father      Review of Systems  Gastrointestinal: Positive for nausea and vomiting.  Genitourinary: Positive for frequency.  Musculoskeletal: Positive for back pain and joint pain.  Neurological: Positive for headaches.  All other systems reviewed and are negative.   Objective:  Physical Exam  Constitutional: He is oriented to person, place, and time. He appears well-developed and well-nourished. No distress.  HENT:  Head: Normocephalic and atraumatic.  Nose: Nose normal.  Eyes: Conjunctivae and EOM are normal. Pupils are equal, round, and reactive to light.  Neck: Normal range of motion. Neck supple.  Cardiovascular: Normal rate, regular rhythm, normal heart sounds and intact distal pulses.   Respiratory: Effort normal and breath sounds normal. No respiratory distress. He has no wheezes.  GI: Soft. Bowel sounds are normal. He exhibits no distension. There is no tenderness.  Musculoskeletal:       Left knee: He exhibits decreased range of motion and swelling. He exhibits no erythema, no  LCL laxity and no MCL laxity. Tenderness found.  Lymphadenopathy:    He has no cervical adenopathy.  Neurological: He is alert and oriented to person, place, and time. No cranial nerve deficit.  Skin: Skin is warm and dry. No rash noted. No erythema.  Psychiatric: He has a normal mood and affect. His behavior is normal.    Vital signs in last 24 hours: @  Labs:   Estimated body mass index is 41.32 kg/(m^2) as calculated from the following:   Height as of 06/06/15: 5'  10" (1.778 m).   Weight as of 06/06/15: 130.636 kg (288 lb).   Imaging Review Plain radiographs demonstrate severe degenerative joint disease of the left knee(s). The overall alignment ismild valgus. The bone quality appears to be good for age and reported activity level.  Assessment/Plan:  End stage arthritis, left knee   The patient history, physical examination, clinical judgment of the provider and imaging studies are consistent with end stage degenerative joint disease of the left knee(s) and total knee arthroplasty is deemed medically necessary. The treatment options including medical management, injection therapy arthroscopy and arthroplasty were discussed at length. The risks and benefits of total knee arthroplasty were presented and reviewed. The risks due to aseptic loosening, infection, stiffness, patella tracking problems, thromboembolic complications and other imponderables were discussed. The patient acknowledged the explanation, agreed to proceed with the plan and consent was signed. Patient is being admitted for inpatient treatment for surgery, pain control, PT, OT, prophylactic antibiotics, VTE prophylaxis, progressive ambulation and ADL's and discharge planning. The patient is planning to be discharged home with home health services

## 2015-07-16 ENCOUNTER — Other Ambulatory Visit (HOSPITAL_COMMUNITY): Payer: Self-pay | Admitting: *Deleted

## 2015-07-16 ENCOUNTER — Ambulatory Visit (HOSPITAL_COMMUNITY)
Admission: RE | Admit: 2015-07-16 | Discharge: 2015-07-16 | Disposition: A | Discharge status: Home / Self Care | Payer: BLUE CROSS/BLUE SHIELD | Source: Ambulatory Visit | Attending: Physician Assistant | Admitting: Physician Assistant

## 2015-07-16 ENCOUNTER — Encounter (HOSPITAL_COMMUNITY): Payer: Self-pay

## 2015-07-16 ENCOUNTER — Encounter (HOSPITAL_COMMUNITY)
Admission: RE | Admit: 2015-07-16 | Discharge: 2015-07-16 | Disposition: A | Discharge status: Home / Self Care | Payer: BLUE CROSS/BLUE SHIELD | Source: Ambulatory Visit | Attending: Orthopedic Surgery | Admitting: Orthopedic Surgery

## 2015-07-16 DIAGNOSIS — M1712 Unilateral primary osteoarthritis, left knee: Secondary | ICD-10-CM | POA: Insufficient documentation

## 2015-07-16 DIAGNOSIS — J9811 Atelectasis: Secondary | ICD-10-CM | POA: Insufficient documentation

## 2015-07-16 DIAGNOSIS — Z01810 Encounter for preprocedural cardiovascular examination: Secondary | ICD-10-CM | POA: Insufficient documentation

## 2015-07-16 DIAGNOSIS — R05 Cough: Secondary | ICD-10-CM | POA: Diagnosis not present

## 2015-07-16 DIAGNOSIS — Z01818 Encounter for other preprocedural examination: Secondary | ICD-10-CM | POA: Diagnosis not present

## 2015-07-16 DIAGNOSIS — Z01812 Encounter for preprocedural laboratory examination: Secondary | ICD-10-CM | POA: Diagnosis not present

## 2015-07-16 HISTORY — DX: Anemia, unspecified: D64.9

## 2015-07-16 HISTORY — DX: Major depressive disorder, single episode, unspecified: F32.9

## 2015-07-16 HISTORY — DX: Anxiety disorder, unspecified: F41.9

## 2015-07-16 HISTORY — DX: Depression, unspecified: F32.A

## 2015-07-16 HISTORY — DX: Unspecified astigmatism, left eye: H52.202

## 2015-07-16 HISTORY — DX: Headache: R51

## 2015-07-16 HISTORY — DX: Gastro-esophageal reflux disease without esophagitis: K21.9

## 2015-07-16 HISTORY — DX: Pneumonia, unspecified organism: J18.9

## 2015-07-16 HISTORY — DX: Headache, unspecified: R51.9

## 2015-07-16 LAB — URINALYSIS, ROUTINE W REFLEX MICROSCOPIC
Bilirubin Urine: NEGATIVE
GLUCOSE, UA: NEGATIVE mg/dL
Hgb urine dipstick: NEGATIVE
Ketones, ur: NEGATIVE mg/dL
LEUKOCYTES UA: NEGATIVE
NITRITE: NEGATIVE
PROTEIN: NEGATIVE mg/dL
Specific Gravity, Urine: 1.012 (ref 1.005–1.030)
pH: 7 (ref 5.0–8.0)

## 2015-07-16 LAB — CBC WITH DIFFERENTIAL/PLATELET
Basophils Absolute: 0.1 10*3/uL (ref 0.0–0.1)
Basophils Relative: 1 %
Eosinophils Absolute: 0.8 10*3/uL — ABNORMAL HIGH (ref 0.0–0.7)
Eosinophils Relative: 11 %
HCT: 37.6 % — ABNORMAL LOW (ref 39.0–52.0)
HEMOGLOBIN: 12.9 g/dL — AB (ref 13.0–17.0)
LYMPHS ABS: 2 10*3/uL (ref 0.7–4.0)
LYMPHS PCT: 27 %
MCH: 31.2 pg (ref 26.0–34.0)
MCHC: 34.3 g/dL (ref 30.0–36.0)
MCV: 90.8 fL (ref 78.0–100.0)
Monocytes Absolute: 0.4 10*3/uL (ref 0.1–1.0)
Monocytes Relative: 5 %
NEUTROS ABS: 4 10*3/uL (ref 1.7–7.7)
NEUTROS PCT: 56 %
Platelets: 271 10*3/uL (ref 150–400)
RBC: 4.14 MIL/uL — AB (ref 4.22–5.81)
RDW: 12 % (ref 11.5–15.5)
WBC: 7.2 10*3/uL (ref 4.0–10.5)

## 2015-07-16 LAB — COMPREHENSIVE METABOLIC PANEL
ALK PHOS: 82 U/L (ref 38–126)
ALT: 19 U/L (ref 17–63)
AST: 22 U/L (ref 15–41)
Albumin: 3.8 g/dL (ref 3.5–5.0)
Anion gap: 10 (ref 5–15)
BUN: 9 mg/dL (ref 6–20)
CALCIUM: 9.1 mg/dL (ref 8.9–10.3)
CO2: 26 mmol/L (ref 22–32)
CREATININE: 0.79 mg/dL (ref 0.61–1.24)
Chloride: 102 mmol/L (ref 101–111)
Glucose, Bld: 112 mg/dL — ABNORMAL HIGH (ref 65–99)
Potassium: 4 mmol/L (ref 3.5–5.1)
SODIUM: 138 mmol/L (ref 135–145)
Total Bilirubin: 0.6 mg/dL (ref 0.3–1.2)
Total Protein: 7.3 g/dL (ref 6.5–8.1)

## 2015-07-16 LAB — TYPE AND SCREEN
ABO/RH(D): O NEG
Antibody Screen: NEGATIVE

## 2015-07-16 LAB — SURGICAL PCR SCREEN
MRSA, PCR: NEGATIVE
Staphylococcus aureus: NEGATIVE

## 2015-07-16 LAB — PROTIME-INR
INR: 1.08 (ref 0.00–1.49)
Prothrombin Time: 14.2 seconds (ref 11.6–15.2)

## 2015-07-16 LAB — APTT: aPTT: 38 seconds — ABNORMAL HIGH (ref 24–37)

## 2015-07-16 LAB — ABO/RH: ABO/RH(D): O NEG

## 2015-07-16 NOTE — Progress Notes (Signed)
Pt denies cardiac or hypertension history. Denies chest pain. Will have some sob with exertion due to pain in knees.

## 2015-07-16 NOTE — Pre-Procedure Instructions (Signed)
Mark [name]  07/16/2015     Your procedure is scheduled on Friday, July 27, 2015 at 7:30 AM.   Report to [facility] Entrance "A" Admitting Office at 5:30 AM.   Call this number if you have problems the morning of surgery: [phone]   Any questions prior to day of surgery, please call [phone] between 8 & 4 PM.   Remember:  Do not eat food or drink liquids after midnight Thursday, 07/26/15.  Take these medicines the morning of surgery with A SIP OF WATER: Bupropion (Wellbutrin), Duloxetine (Cymbalta), Oxycodone or Tylenol - if needed, Afrin Nasal Spray - if needed  Stop Multivitamins, Fish Oil and Herbal Medications 7 days prior to surgery. Do not use Aspirin products or NSAIDS (Ibuprofen, Aleve, etc.) 7 days prior to surgery.   Do not wear jewelry.  Do not wear lotions, powders, or cologne.  You may wear deodorant.  Men may shave face and neck.  Do not bring valuables to the hospital.  [name] is not responsible for any belongings or valuables.  Contacts, dentures or bridgework may not be worn into surgery.  Leave your suitcase in the car.  After surgery it may be brought to your room.  For patients admitted to the hospital, discharge time will be determined by your treatment team.  Special instructions:  [place] - Preparing for Surgery  Before surgery, you can play an important role.  Because skin is not sterile, your skin needs to be as free of germs as possible.  You can reduce the number of germs on you skin by washing with CHG (chlorahexidine gluconate) soap before surgery.  CHG is an antiseptic cleaner which kills germs and bonds with the skin to continue killing germs even after washing.  Please DO NOT use if you have an allergy to CHG or antibacterial soaps.  If your skin becomes reddened/irritated stop using the CHG and inform your nurse when you arrive at Short Stay.  Do not shave (including legs and underarms) for at least 48 hours prior to  the first CHG shower.  You may shave your face.  Please follow these instructions carefully:   1.  Shower with CHG Soap the night before surgery and the                                morning of Surgery.  2.  If you choose to wash your hair, wash your hair first as usual with your       normal shampoo.  3.  After you shampoo, rinse your hair and body thoroughly to remove the                      Shampoo.  4.  Use CHG as you would any other liquid soap.  You can apply chg directly       to the skin and wash gently with scrungie or a clean washcloth.  5.  Apply the CHG Soap to your body ONLY FROM THE NECK DOWN.        Do not use on open wounds or open sores.  Avoid contact with your eyes, ears, mouth and genitals (private parts).  Wash genitals (private parts) with your normal soap.  6.  Wash thoroughly, paying special attention to the area where your surgery        will be performed.  7.  Thoroughly  rinse your body with warm water from the neck down.  8.  DO NOT shower/wash with your normal soap after using and rinsing off       the CHG Soap.  9.  Pat yourself dry with a clean towel.            10.  Wear clean pajamas.            11.  Place clean sheets on your bed the night of your first shower and do not        sleep with pets.  Day of Surgery  Do not apply any lotions the morning of surgery.  Please wear clean clothes to the hospital.   Please read over the following fact sheets that you were given. Pain Booklet, Coughing and Deep Breathing, Blood Transfusion Information, MRSA Information and Surgical Site Infection Prevention

## 2015-07-17 LAB — URINE CULTURE: CULTURE: NO GROWTH

## 2015-07-26 MED ORDER — SODIUM CHLORIDE 0.9 % IV SOLN: INTRAVENOUS | Status: DC

## 2015-07-26 MED ORDER — SODIUM CHLORIDE 0.9 % IV SOLN
1000.0000 mg | INTRAVENOUS | Status: AC
  Administered 2015-07-27: 1000 mg via INTRAVENOUS
  Filled 2015-07-26: qty 10

## 2015-07-26 MED ORDER — VANCOMYCIN HCL 10 G IV SOLR
1500.0000 mg | INTRAVENOUS | Status: AC
  Administered 2015-07-27: 1500 mg via INTRAVENOUS
  Filled 2015-07-26: qty 1500

## 2015-07-27 ENCOUNTER — Inpatient Hospital Stay (HOSPITAL_COMMUNITY)
Admission: RE | Admit: 2015-07-27 | Discharge: 2015-07-30 | DRG: 470 | Disposition: A | Discharge status: Home Health | Payer: Worker's Compensation | Source: Ambulatory Visit | Attending: Orthopedic Surgery | Admitting: Orthopedic Surgery

## 2015-07-27 ENCOUNTER — Encounter (HOSPITAL_COMMUNITY)
Admission: RE | Disposition: A | Discharge status: Home Health | Payer: Self-pay | Source: Ambulatory Visit | Attending: Orthopedic Surgery

## 2015-07-27 ENCOUNTER — Inpatient Hospital Stay (HOSPITAL_COMMUNITY): Payer: Worker's Compensation | Admitting: Anesthesiology

## 2015-07-27 ENCOUNTER — Encounter (HOSPITAL_COMMUNITY): Payer: Self-pay

## 2015-07-27 ENCOUNTER — Inpatient Hospital Stay (HOSPITAL_COMMUNITY): Payer: Worker's Compensation | Admitting: Emergency Medicine

## 2015-07-27 DIAGNOSIS — Z8249 Family history of ischemic heart disease and other diseases of the circulatory system: Secondary | ICD-10-CM | POA: Diagnosis not present

## 2015-07-27 DIAGNOSIS — Z9884 Bariatric surgery status: Secondary | ICD-10-CM

## 2015-07-27 DIAGNOSIS — M1712 Unilateral primary osteoarthritis, left knee: Secondary | ICD-10-CM | POA: Diagnosis present

## 2015-07-27 DIAGNOSIS — E785 Hyperlipidemia, unspecified: Secondary | ICD-10-CM | POA: Diagnosis present

## 2015-07-27 DIAGNOSIS — Z825 Family history of asthma and other chronic lower respiratory diseases: Secondary | ICD-10-CM

## 2015-07-27 DIAGNOSIS — K219 Gastro-esophageal reflux disease without esophagitis: Secondary | ICD-10-CM | POA: Diagnosis present

## 2015-07-27 DIAGNOSIS — I10 Essential (primary) hypertension: Secondary | ICD-10-CM | POA: Diagnosis present

## 2015-07-27 DIAGNOSIS — Z8261 Family history of arthritis: Secondary | ICD-10-CM

## 2015-07-27 DIAGNOSIS — Z809 Family history of malignant neoplasm, unspecified: Secondary | ICD-10-CM | POA: Diagnosis not present

## 2015-07-27 DIAGNOSIS — M25562 Pain in left knee: Secondary | ICD-10-CM | POA: Diagnosis present

## 2015-07-27 DIAGNOSIS — Z833 Family history of diabetes mellitus: Secondary | ICD-10-CM | POA: Diagnosis not present

## 2015-07-27 HISTORY — PX: TOTAL KNEE ARTHROPLASTY: SHX125

## 2015-07-27 LAB — CREATININE, SERUM
CREATININE: 0.74 mg/dL (ref 0.61–1.24)
GFR calc Af Amer: >60 mL/min (ref >60)

## 2015-07-27 LAB — CBC
HEMATOCRIT: 34.6 % — AB (ref 39.0–52.0)
Hemoglobin: 12 g/dL — ABNORMAL LOW (ref 13.0–17.0)
MCH: 31.4 pg (ref 26.0–34.0)
MCHC: 34.7 g/dL (ref 30.0–36.0)
MCV: 90.6 fL (ref 78.0–100.0)
Platelets: 280 10*3/uL (ref 150–400)
RBC: 3.82 MIL/uL — ABNORMAL LOW (ref 4.22–5.81)
RDW: 12.2 % (ref 11.5–15.5)
WBC: 16 10*3/uL — ABNORMAL HIGH (ref 4.0–10.5)

## 2015-07-27 SURGERY — ARTHROPLASTY, KNEE, TOTAL
Anesthesia: Regional | Site: Knee | Laterality: Left

## 2015-07-27 MED ORDER — LACTATED RINGERS IV SOLN
INTRAVENOUS | Status: DC | PRN
  Administered 2015-07-27 (×2): via INTRAVENOUS

## 2015-07-27 MED ORDER — LIDOCAINE HCL (CARDIAC) 20 MG/ML IV SOLN
INTRAVENOUS | Status: AC
  Filled 2015-07-27: qty 5

## 2015-07-27 MED ORDER — BUPROPION HCL ER (SR) 150 MG PO TB12
150.0000 mg | ORAL_TABLET | Freq: Two times a day (BID) | ORAL | Status: DC
  Administered 2015-07-27 – 2015-07-30 (×6): 150 mg via ORAL
  Filled 2015-07-27 (×7): qty 1

## 2015-07-27 MED ORDER — DIPHENHYDRAMINE HCL 12.5 MG/5ML PO ELIX: 12.5000 mg | ORAL_SOLUTION | ORAL | Status: DC | PRN

## 2015-07-27 MED ORDER — NEOSTIGMINE METHYLSULFATE 10 MG/10ML IV SOLN
INTRAVENOUS | Status: DC | PRN
  Administered 2015-07-27: 4 mg via INTRAVENOUS

## 2015-07-27 MED ORDER — FENTANYL CITRATE (PF) 100 MCG/2ML IJ SOLN
INTRAMUSCULAR | Status: AC
  Filled 2015-07-27: qty 2

## 2015-07-27 MED ORDER — ACETAMINOPHEN 325 MG PO TABS: 650.0000 mg | ORAL_TABLET | Freq: Four times a day (QID) | ORAL | Status: DC | PRN

## 2015-07-27 MED ORDER — PROMETHAZINE HCL 25 MG/ML IJ SOLN
6.2500 mg | INTRAMUSCULAR | Status: DC | PRN
  Administered 2015-07-27: 12.5 mg via INTRAVENOUS

## 2015-07-27 MED ORDER — MENTHOL 3 MG MT LOZG: 1.0000 | LOZENGE | OROMUCOSAL | Status: DC | PRN

## 2015-07-27 MED ORDER — VANCOMYCIN HCL IN DEXTROSE 1-5 GM/200ML-% IV SOLN
1000.0000 mg | Freq: Two times a day (BID) | INTRAVENOUS | Status: AC
  Administered 2015-07-27: 1000 mg via INTRAVENOUS
  Filled 2015-07-27: qty 200

## 2015-07-27 MED ORDER — ALUM & MAG HYDROXIDE-SIMETH 200-200-20 MG/5ML PO SUSP: 30.0000 mL | ORAL | Status: DC | PRN

## 2015-07-27 MED ORDER — ROCURONIUM BROMIDE 50 MG/5ML IV SOLN
INTRAVENOUS | Status: AC
  Filled 2015-07-27: qty 1

## 2015-07-27 MED ORDER — METOCLOPRAMIDE HCL 5 MG/ML IJ SOLN
5.0000 mg | Freq: Three times a day (TID) | INTRAMUSCULAR | Status: DC | PRN
  Administered 2015-07-27 – 2015-07-28 (×2): 10 mg via INTRAVENOUS
  Filled 2015-07-27 (×2): qty 2

## 2015-07-27 MED ORDER — CHLORHEXIDINE GLUCONATE 4 % EX LIQD: 60.0000 mL | Freq: Once | CUTANEOUS | Status: DC

## 2015-07-27 MED ORDER — PROPOFOL 10 MG/ML IV BOLUS
INTRAVENOUS | Status: AC
  Filled 2015-07-27: qty 40

## 2015-07-27 MED ORDER — BISACODYL 10 MG RE SUPP: 10.0000 mg | Freq: Every day | RECTAL | Status: DC | PRN

## 2015-07-27 MED ORDER — BUPIVACAINE-EPINEPHRINE 0.5% -1:200000 IJ SOLN: INTRAMUSCULAR | Status: DC | PRN

## 2015-07-27 MED ORDER — LIDOCAINE HCL (CARDIAC) 20 MG/ML IV SOLN
INTRAVENOUS | Status: DC | PRN
  Administered 2015-07-27: 100 mg via INTRAVENOUS

## 2015-07-27 MED ORDER — ROCURONIUM BROMIDE 100 MG/10ML IV SOLN
INTRAVENOUS | Status: DC | PRN
  Administered 2015-07-27: 50 mg via INTRAVENOUS
  Administered 2015-07-27: 20 mg via INTRAVENOUS

## 2015-07-27 MED ORDER — OXYMETAZOLINE HCL 0.05 % NA SOLN
1.0000 | Freq: Four times a day (QID) | NASAL | Status: DC | PRN
  Filled 2015-07-27: qty 15

## 2015-07-27 MED ORDER — DOCUSATE SODIUM 100 MG PO CAPS
100.0000 mg | ORAL_CAPSULE | Freq: Two times a day (BID) | ORAL | Status: DC
  Administered 2015-07-27 – 2015-07-30 (×6): 100 mg via ORAL
  Filled 2015-07-27 (×7): qty 1

## 2015-07-27 MED ORDER — DULOXETINE HCL 30 MG PO CPEP
90.0000 mg | ORAL_CAPSULE | Freq: Every day | ORAL | Status: DC
  Administered 2015-07-27 – 2015-07-30 (×4): 90 mg via ORAL
  Filled 2015-07-27 (×4): qty 3

## 2015-07-27 MED ORDER — ACETAMINOPHEN 650 MG RE SUPP: 650.0000 mg | Freq: Four times a day (QID) | RECTAL | Status: DC | PRN

## 2015-07-27 MED ORDER — FENTANYL CITRATE (PF) 100 MCG/2ML IJ SOLN
INTRAMUSCULAR | Status: AC
  Administered 2015-07-27 (×6): 50 ug via INTRAVENOUS
  Administered 2015-07-27: 200 ug via INTRAVENOUS
  Administered 2015-07-27 (×3): 50 ug via INTRAVENOUS
  Filled 2015-07-27: qty 2

## 2015-07-27 MED ORDER — PROMETHAZINE HCL 25 MG/ML IJ SOLN
INTRAMUSCULAR | Status: AC
  Filled 2015-07-27: qty 1

## 2015-07-27 MED ORDER — DULOXETINE HCL 60 MG PO CPEP: 60.0000 mg | ORAL_CAPSULE | Freq: Every day | ORAL | Status: DC

## 2015-07-27 MED ORDER — ONDANSETRON HCL 4 MG/2ML IJ SOLN
INTRAMUSCULAR | Status: AC
  Filled 2015-07-27: qty 2

## 2015-07-27 MED ORDER — METHOCARBAMOL 750 MG PO TABS
750.0000 mg | ORAL_TABLET | Freq: Four times a day (QID) | ORAL | Status: DC | PRN
  Administered 2015-07-27 – 2015-07-28 (×4): 750 mg via ORAL
  Filled 2015-07-27 (×4): qty 1

## 2015-07-27 MED ORDER — ACETAMINOPHEN 500 MG PO TABS
ORAL_TABLET | ORAL | Status: AC
  Administered 2015-07-27: 1000 mg via ORAL
  Filled 2015-07-27: qty 2

## 2015-07-27 MED ORDER — FLEET ENEMA 7-19 GM/118ML RE ENEM: 1.0000 | ENEMA | Freq: Once | RECTAL | Status: DC | PRN

## 2015-07-27 MED ORDER — EPHEDRINE SULFATE 50 MG/ML IJ SOLN
INTRAMUSCULAR | Status: AC
Start: 2015-07-27 — End: 2015-07-27
  Filled 2015-07-27: qty 1

## 2015-07-27 MED ORDER — FENTANYL CITRATE (PF) 250 MCG/5ML IJ SOLN
INTRAMUSCULAR | Status: AC
  Filled 2015-07-27: qty 5

## 2015-07-27 MED ORDER — ARTIFICIAL TEARS OP OINT
TOPICAL_OINTMENT | OPHTHALMIC | Status: AC
  Filled 2015-07-27: qty 3.5

## 2015-07-27 MED ORDER — SODIUM CHLORIDE 0.9 % IV SOLN
INTRAVENOUS | Status: DC
  Administered 2015-07-27: 21:00:00 via INTRAVENOUS

## 2015-07-27 MED ORDER — OXYCODONE HCL 5 MG PO TABS
ORAL_TABLET | ORAL | Status: AC
  Filled 2015-07-27: qty 2

## 2015-07-27 MED ORDER — HYDROMORPHONE HCL 1 MG/ML IJ SOLN
1.0000 mg | INTRAMUSCULAR | Status: DC | PRN
  Administered 2015-07-27 – 2015-07-30 (×31): 1 mg via INTRAVENOUS
  Filled 2015-07-27 (×30): qty 1

## 2015-07-27 MED ORDER — ACETAMINOPHEN 500 MG PO TABS
1000.0000 mg | ORAL_TABLET | Freq: Four times a day (QID) | ORAL | Status: AC
  Administered 2015-07-27 – 2015-07-28 (×3): 1000 mg via ORAL
  Filled 2015-07-27 (×4): qty 2

## 2015-07-27 MED ORDER — BUPIVACAINE-EPINEPHRINE (PF) 0.5% -1:200000 IJ SOLN
INTRAMUSCULAR | Status: AC
  Filled 2015-07-27: qty 30

## 2015-07-27 MED ORDER — LORATADINE 10 MG PO TABS
10.0000 mg | ORAL_TABLET | Freq: Every day | ORAL | Status: DC
  Administered 2015-07-28 – 2015-07-30 (×3): 10 mg via ORAL
  Filled 2015-07-27 (×4): qty 1

## 2015-07-27 MED ORDER — PROPOFOL 10 MG/ML IV BOLUS
INTRAVENOUS | Status: DC | PRN
  Administered 2015-07-27: 200 mg via INTRAVENOUS

## 2015-07-27 MED ORDER — MIDAZOLAM HCL 2 MG/2ML IJ SOLN
INTRAMUSCULAR | Status: AC
  Administered 2015-07-27 (×2): 2 mg via INTRAVENOUS
  Filled 2015-07-27: qty 2

## 2015-07-27 MED ORDER — ONDANSETRON HCL 4 MG/2ML IJ SOLN
INTRAMUSCULAR | Status: DC | PRN
  Administered 2015-07-27: 4 mg via INTRAVENOUS

## 2015-07-27 MED ORDER — HYDROMORPHONE HCL 1 MG/ML IJ SOLN
INTRAMUSCULAR | Status: AC
  Filled 2015-07-27: qty 1

## 2015-07-27 MED ORDER — ONDANSETRON HCL 4 MG PO TABS
4.0000 mg | ORAL_TABLET | Freq: Four times a day (QID) | ORAL | Status: DC | PRN
  Administered 2015-07-28: 4 mg via ORAL

## 2015-07-27 MED ORDER — MIDAZOLAM HCL 2 MG/2ML IJ SOLN
INTRAMUSCULAR | Status: AC
  Filled 2015-07-27: qty 2

## 2015-07-27 MED ORDER — ONDANSETRON HCL 4 MG PO TABS
8.0000 mg | ORAL_TABLET | Freq: Three times a day (TID) | ORAL | Status: DC
  Administered 2015-07-27 – 2015-07-29 (×5): 8 mg via ORAL
  Filled 2015-07-27: qty 2
  Filled 2015-07-27 (×4): qty 1
  Filled 2015-07-27 (×2): qty 2
  Filled 2015-07-27: qty 1
  Filled 2015-07-27 (×2): qty 2
  Filled 2015-07-27: qty 1
  Filled 2015-07-27: qty 2

## 2015-07-27 MED ORDER — METOCLOPRAMIDE HCL 5 MG PO TABS: 5.0000 mg | ORAL_TABLET | Freq: Three times a day (TID) | ORAL | Status: DC | PRN

## 2015-07-27 MED ORDER — SENNOSIDES-DOCUSATE SODIUM 8.6-50 MG PO TABS: 1.0000 | ORAL_TABLET | Freq: Every evening | ORAL | Status: DC | PRN

## 2015-07-27 MED ORDER — ENOXAPARIN SODIUM 30 MG/0.3ML ~~LOC~~ SOLN
30.0000 mg | Freq: Two times a day (BID) | SUBCUTANEOUS | Status: DC
  Administered 2015-07-28 – 2015-07-29 (×4): 30 mg via SUBCUTANEOUS
  Filled 2015-07-27 (×4): qty 0.3

## 2015-07-27 MED ORDER — FERROUS SULFATE 325 (65 FE) MG PO TABS
325.0000 mg | ORAL_TABLET | Freq: Two times a day (BID) | ORAL | Status: DC
  Administered 2015-07-27 – 2015-07-30 (×6): 325 mg via ORAL
  Filled 2015-07-27 (×6): qty 1

## 2015-07-27 MED ORDER — ACETAMINOPHEN 500 MG PO TABS
1000.0000 mg | ORAL_TABLET | Freq: Once | ORAL | Status: AC
  Administered 2015-07-27: 1000 mg via ORAL

## 2015-07-27 MED ORDER — OXYCODONE HCL 5 MG PO TABS
10.0000 mg | ORAL_TABLET | ORAL | Status: DC | PRN
  Administered 2015-07-27 – 2015-07-28 (×8): 10 mg via ORAL
  Filled 2015-07-27 (×7): qty 2

## 2015-07-27 MED ORDER — SODIUM CHLORIDE 0.9 % IJ SOLN
INTRAMUSCULAR | Status: AC
  Filled 2015-07-27: qty 10

## 2015-07-27 MED ORDER — ENOXAPARIN SODIUM 30 MG/0.3ML ~~LOC~~ SOLN: 30.0000 mg | Freq: Two times a day (BID) | SUBCUTANEOUS | Status: DC

## 2015-07-27 MED ORDER — SUCCINYLCHOLINE CHLORIDE 20 MG/ML IJ SOLN
INTRAMUSCULAR | Status: AC
  Filled 2015-07-27: qty 1

## 2015-07-27 MED ORDER — NEOSTIGMINE METHYLSULFATE 10 MG/10ML IV SOLN
INTRAVENOUS | Status: AC
Start: 2015-07-27 — End: 2015-07-27
  Filled 2015-07-27: qty 1

## 2015-07-27 MED ORDER — ONDANSETRON HCL 4 MG/2ML IJ SOLN
4.0000 mg | Freq: Four times a day (QID) | INTRAMUSCULAR | Status: DC | PRN
  Administered 2015-07-27: 4 mg via INTRAVENOUS
  Filled 2015-07-27: qty 2

## 2015-07-27 MED ORDER — GLYCOPYRROLATE 0.2 MG/ML IJ SOLN
INTRAMUSCULAR | Status: AC
  Filled 2015-07-27: qty 4

## 2015-07-27 MED ORDER — SODIUM CHLORIDE 0.9 % IR SOLN
Status: DC | PRN
  Administered 2015-07-27: 1000 mL

## 2015-07-27 MED ORDER — PHENOL 1.4 % MT LIQD: 1.0000 | OROMUCOSAL | Status: DC | PRN

## 2015-07-27 MED ORDER — FENTANYL 25 MCG/HR TD PT72
50.0000 ug | MEDICATED_PATCH | TRANSDERMAL | Status: DC
  Administered 2015-07-29: 50 ug via TRANSDERMAL
  Filled 2015-07-27 (×2): qty 2

## 2015-07-27 MED ORDER — ONDANSETRON HCL 8 MG PO TABS: 8.0000 mg | ORAL_TABLET | Freq: Three times a day (TID) | ORAL | Status: DC | PRN

## 2015-07-27 MED ORDER — FENTANYL CITRATE (PF) 100 MCG/2ML IJ SOLN
25.0000 ug | INTRAMUSCULAR | Status: DC | PRN
  Administered 2015-07-27 (×2): 50 ug via INTRAVENOUS

## 2015-07-27 MED ORDER — GLYCOPYRROLATE 0.2 MG/ML IJ SOLN
INTRAMUSCULAR | Status: DC | PRN
  Administered 2015-07-27: 0.6 mg via INTRAVENOUS

## 2015-07-27 SURGICAL SUPPLY — 64 items
ANCHOR ROTATOR CUFF #2 (Anchor) ×4 IMPLANT
BANDAGE ACE 4X5 VEL STRL LF (GAUZE/BANDAGES/DRESSINGS) ×2 IMPLANT
BANDAGE ACE 6X5 VEL STRL LF (GAUZE/BANDAGES/DRESSINGS) ×2 IMPLANT
BANDAGE ESMARK 6X9 LF (GAUZE/BANDAGES/DRESSINGS) ×1 IMPLANT
BLADE SAGITTAL 25.0X1.19X90 (BLADE) ×2 IMPLANT
BLADE SAW SAG 90X13X1.27 (BLADE) ×2 IMPLANT
BNDG ESMARK 6X9 LF (GAUZE/BANDAGES/DRESSINGS) ×2
BONE CEMENT GENTAMICIN (Cement) ×4 IMPLANT
BOWL SMART MIX CTS (DISPOSABLE) ×2 IMPLANT
CAP KNEE TOTAL 3 SIGMA ×2 IMPLANT
CEMENT BONE GENTAMICIN 40 (Cement) ×2 IMPLANT
COVER SURGICAL LIGHT HANDLE (MISCELLANEOUS) ×2 IMPLANT
CUFF TOURNIQUET SINGLE 34IN LL (TOURNIQUET CUFF) ×2 IMPLANT
CUFF TOURNIQUET SINGLE 44IN (TOURNIQUET CUFF) IMPLANT
DRAPE INCISE IOBAN 66X45 STRL (DRAPES) IMPLANT
DRAPE ORTHO SPLIT 77X108 STRL (DRAPES) ×2
DRAPE SURG ORHT 6 SPLT 77X108 (DRAPES) ×2 IMPLANT
DRAPE U-SHAPE 47X51 STRL (DRAPES) ×2 IMPLANT
DRSG ADAPTIC 3X8 NADH LF (GAUZE/BANDAGES/DRESSINGS) ×2 IMPLANT
DRSG PAD ABDOMINAL 8X10 ST (GAUZE/BANDAGES/DRESSINGS) ×4 IMPLANT
DURAPREP 26ML APPLICATOR (WOUND CARE) ×2 IMPLANT
ELECT REM PT RETURN 9FT ADLT (ELECTROSURGICAL) ×2
ELECTRODE REM PT RTRN 9FT ADLT (ELECTROSURGICAL) ×1 IMPLANT
EVACUATOR 1/8 PVC DRAIN (DRAIN) ×2 IMPLANT
FACESHIELD WRAPAROUND (MASK) ×4 IMPLANT
FLOSEAL 10ML (HEMOSTASIS) IMPLANT
GAUZE SPONGE 4X4 12PLY STRL (GAUZE/BANDAGES/DRESSINGS) ×2 IMPLANT
GLOVE BIOGEL PI IND STRL 8 (GLOVE) ×4 IMPLANT
GLOVE BIOGEL PI INDICATOR 8 (GLOVE) ×4
GLOVE ORTHO TXT STRL SZ7.5 (GLOVE) ×2 IMPLANT
GLOVE SURG ORTHO 8.0 STRL STRW (GLOVE) ×2 IMPLANT
GOWN STRL REUS W/ TWL LRG LVL3 (GOWN DISPOSABLE) ×2 IMPLANT
GOWN STRL REUS W/ TWL XL LVL3 (GOWN DISPOSABLE) ×1 IMPLANT
GOWN STRL REUS W/TWL 2XL LVL3 (GOWN DISPOSABLE) ×2 IMPLANT
GOWN STRL REUS W/TWL LRG LVL3 (GOWN DISPOSABLE) ×2
GOWN STRL REUS W/TWL XL LVL3 (GOWN DISPOSABLE) ×1
HANDPIECE INTERPULSE COAX TIP (DISPOSABLE) ×1
HOOD PEEL AWAY FACE SHEILD DIS (HOOD) ×2 IMPLANT
IMMOBILIZER KNEE 22 UNIV (SOFTGOODS) ×2 IMPLANT
KIT BASIN OR (CUSTOM PROCEDURE TRAY) ×2 IMPLANT
KIT ROOM TURNOVER OR (KITS) ×2 IMPLANT
MANIFOLD NEPTUNE II (INSTRUMENTS) ×2 IMPLANT
NEEDLE 22X1 1/2 (OR ONLY) (NEEDLE) IMPLANT
NS IRRIG 1000ML POUR BTL (IV SOLUTION) ×2 IMPLANT
PACK TOTAL JOINT (CUSTOM PROCEDURE TRAY) ×2 IMPLANT
PACK UNIVERSAL I (CUSTOM PROCEDURE TRAY) ×2 IMPLANT
PAD ARMBOARD 7.5X6 YLW CONV (MISCELLANEOUS) ×4 IMPLANT
PAD CAST 4YDX4 CTTN HI CHSV (CAST SUPPLIES) ×1 IMPLANT
PADDING CAST COTTON 4X4 STRL (CAST SUPPLIES) ×1
PADDING CAST COTTON 6X4 STRL (CAST SUPPLIES) ×2 IMPLANT
SET HNDPC FAN SPRY TIP SCT (DISPOSABLE) ×1 IMPLANT
STAPLER VISISTAT 35W (STAPLE) ×2 IMPLANT
SUCTION FRAZIER HANDLE 10FR (MISCELLANEOUS) ×1
SUCTION TUBE FRAZIER 10FR DISP (MISCELLANEOUS) ×1 IMPLANT
SUT ETHIBOND NAB CT1 #1 30IN (SUTURE) ×8 IMPLANT
SUT VIC AB 0 CT1 27 (SUTURE) ×1
SUT VIC AB 0 CT1 27XBRD ANBCTR (SUTURE) ×1 IMPLANT
SUT VIC AB 2-0 CT1 27 (SUTURE) ×2
SUT VIC AB 2-0 CT1 TAPERPNT 27 (SUTURE) ×2 IMPLANT
SYR CONTROL 10ML LL (SYRINGE) IMPLANT
TOWEL OR 17X24 6PK STRL BLUE (TOWEL DISPOSABLE) ×2 IMPLANT
TOWEL OR 17X26 10 PK STRL BLUE (TOWEL DISPOSABLE) ×2 IMPLANT
TRAY FOLEY CATH 16FRSI W/METER (SET/KITS/TRAYS/PACK) IMPLANT
WATER STERILE IRR 1000ML POUR (IV SOLUTION) ×2 IMPLANT

## 2015-07-27 NOTE — Discharge Instructions (Signed)

## 2015-07-27 NOTE — Transfer of Care (Signed)
Immediate Anesthesia Transfer of Care Note  Patient: Mark [name]  Procedure(s) Performed: Procedure(s): LEFT TOTAL KNEE ARTHROPLASTY (Left)  Patient Location: PACU  Anesthesia Type:General and Regional  Level of Consciousness: awake, alert , oriented and sedated  Airway & Oxygen Therapy: Patient Spontanous Breathing and Patient connected to nasal cannula oxygen  Post-op Assessment: Report given to RN, Post -op Vital signs reviewed and stable and Patient moving all extremities  Post vital signs: Reviewed and stable  Last Vitals:  Filed Vitals:   07/27/15 0600  BP: 150/75  Pulse: 67  Temp: 36.8 C    Complications: No apparent anesthesia complications

## 2015-07-27 NOTE — Evaluation (Signed)
Physical Therapy Evaluation Patient Details Name: Mark [name] MRN: [id number] DOB: [birth year] Today's Date: 07/27/2015   History of Present Illness  50 y.o. male s/p left total knee aprthroplasty. Hx of HTN, obesity, sleep apnea, and chronic pain.  Clinical Impression  Pt is s/p left TKA presenting with the deficits listed below (see PT Problem List). Tolerated bed mobility, transfer, and gait (up to 8 feet) training post op day #0. Educated on positioning with use of zero knee foam, and exercises. Patient will need to be able to navigate 6 steps to enter home. Based on patient's obesity, pain tolerance, and environmental challenges to enter home, I anticipate Mark [name] will likely be ready for d/c by Sunday afternoon from a functional/mobility standpoint. Pt will benefit from skilled PT to increase their independence and safety with mobility to allow discharge to the venue listed below.      Follow Up Recommendations Home health PT;Supervision for mobility/OOB    Equipment Recommendations  None recommended by PT    Recommendations for Other Services       Precautions / Restrictions Precautions Precautions: Knee Precaution Booklet Issued: Yes (comment) Precaution Comments: Reviewed knee precautions including zero knee use Required Braces or Orthoses: Knee Immobilizer - Left Knee Immobilizer - Left: Other (comment) (until block wears off) Restrictions Weight Bearing Restrictions: Yes LLE Weight Bearing: Weight bearing as tolerated      Mobility  Bed Mobility Overal bed mobility: Needs Assistance Bed Mobility: Supine to Sit     Supine to sit: Supervision;HOB elevated     General bed mobility comments: supervision for safety. VC to use RLE to support RLE out of bed  Transfers Overall transfer level: Needs assistance Equipment used: Rolling walker (2 wheeled) Transfers: Sit to/from Stand Sit to Stand: Min assist         General transfer comment: Min assist for boost  to stand from lowest bed setting. Good strength from RLE, limited to no WB through LLE initially. VC for hand placement.  Ambulation/Gait Ambulation/Gait assistance: Min guard Ambulation Distance (Feet): 8 Feet Assistive device: Rolling walker (2 wheeled) Gait Pattern/deviations: Step-to pattern;Decreased step length - right;Decreased stance time - left;Decreased stride length;Antalgic;Trunk flexed Gait velocity: decreased Gait velocity interpretation: Below normal speed for age/gender General Gait Details: Educated on safe DME use with a rolling walker. VC for upright posture and sequencing. Cues for Lt knee extension in stance phase for quad activation. Mild instability noted with knee immobilizer in place (Lt.) Tolerated short distance, limited by pain despite being medicated.  Stairs            Wheelchair Mobility    Modified Rankin (Stroke Patients Only)       Balance Overall balance assessment: Needs assistance Sitting-balance support: No upper extremity supported;Feet supported Sitting balance-Leahy Scale: Normal     Standing balance support: No upper extremity supported Standing balance-Leahy Scale: Good                               Pertinent Vitals/Pain Pain Assessment: Faces Faces Pain Scale: Hurts whole lot Pain Location: Lt knee  Pain Descriptors / Indicators: Aching;Burning;Throbbing Pain Intervention(s): Monitored during session;Repositioned;Premedicated before session;Limited activity within patient's tolerance;Patient requesting pain meds-RN notified;RN gave pain meds during session;Ice applied    Home Living Family/patient expects to be discharged to:: Private residence Living Arrangements: Spouse/significant other;Children Available Help at Discharge: Family;Available 24 hours/day Type of Home: House Home Access: Stairs to enter Entrance  Stairs-Rails: Doctor, general practice of Steps: 6 Home Layout: One level Home Equipment:  Walker - 2 wheels      Prior Function Level of Independence: Independent with assistive device(s)         Comments: using wakler occasionally for ambulation     Hand Dominance   Dominant Hand: Right    Extremity/Trunk Assessment   Upper Extremity Assessment: Defer to OT evaluation           Lower Extremity Assessment: LLE deficits/detail   LLE Deficits / Details: Decreased strength and ROM post op. Limited assessement due to pain     Communication   Communication: No difficulties  Cognition Arousal/Alertness: Awake/alert Behavior During Therapy: WFL for tasks assessed/performed Overall Cognitive Status: Within Functional Limits for tasks assessed                      General Comments General comments (skin integrity, edema, etc.): Educated on positioning and exercises.    Exercises Total Joint Exercises Ankle Circles/Pumps: AROM;Both;10 reps;Seated Quad Sets: AROM;Left;10 reps;Seated      Assessment/Plan    PT Assessment Patient needs continued PT services  PT Diagnosis Difficulty walking;Abnormality of gait;Acute pain   PT Problem List Decreased strength;Decreased range of motion;Decreased activity tolerance;Decreased balance;Decreased mobility;Decreased knowledge of use of DME;Decreased knowledge of precautions;Obesity;Pain  PT Treatment Interventions DME instruction;Gait training;Stair training;Therapeutic activities;Functional mobility training;Therapeutic exercise;Balance training;Neuromuscular re-education;Patient/family education;Modalities   PT Goals (Current goals can be found in the Care Plan section) Acute Rehab PT Goals Patient Stated Goal: No pain PT Goal Formulation: With patient Time For Goal Achievement: 08/03/15 Potential to Achieve Goals: Good    Frequency 7X/week   Barriers to discharge        Co-evaluation               End of Session Equipment Utilized During Treatment: Gait belt;Left knee immobilizer Activity  Tolerance: Patient limited by pain Patient left: in chair;with call bell/phone within reach;with SCD's reapplied Nurse Communication: Mobility status;Patient requests pain meds         Time: 4098-1191 PT Time Calculation (min) (ACUTE ONLY): 34 min   Charges:   PT Evaluation $PT Eval Low Complexity: 1 Procedure PT Treatments $Therapeutic Activity: 8-22 mins   PT G Codes:        Berton Mount 07/27/2015, 4:56 PM  [facility] [place], [place] [phone]

## 2015-07-27 NOTE — Anesthesia Procedure Notes (Addendum)
Procedure Name: Intubation Date/Time: 07/27/2015 7:43 AM Performed by: Fransisca Kaufmann Pre-anesthesia Checklist: Patient identified, Emergency Drugs available, Suction available, Patient being monitored and Timeout performed Patient Re-evaluated:Patient Re-evaluated prior to inductionOxygen Delivery Method: Circle system utilized Preoxygenation: Pre-oxygenation with 100% oxygen Intubation Type: IV induction Ventilation: Mask ventilation without difficulty Laryngoscope Size: Mac and 4 Grade View: Grade I Tube type: Oral Tube size: 8.0 mm Number of attempts: 1 Airway Equipment and Method: Stylet Placement Confirmation: ETT inserted through vocal cords under direct vision,  positive ETCO2 and breath sounds checked- equal and bilateral Secured at: 23 cm Tube secured with: Tape Dental Injury: Teeth and Oropharynx as per pre-operative assessment    Anesthesia Regional Block:  Femoral nerve block  Pre-Anesthetic Checklist: ,, timeout performed, Correct Patient, Correct Site, Correct Laterality, Correct Procedure, Correct Position, site marked, Risks and benefits discussed,  Surgical consent,  Pre-op evaluation,  At surgeon's request and post-op pain management  Laterality: Left  Prep: chloraprep       Needles:   Needle Type: Stimulator Needle - 80          Additional Needles:  Procedures: Doppler guided and nerve stimulator Femoral nerve block  Nerve Stimulator or Paresthesia:  Response: 0.5 mA,   Additional Responses:   Narrative:  Start time: 07/27/2015 7:00 AM End time: 07/27/2015 7:15 AM  Performed by: Personally  Anesthesiologist: Judie Petit

## 2015-07-27 NOTE — Op Note (Signed)
NAME:  Mark [name], Mark [name] NO.:  [id number]  MEDICAL RECORD NO.:  [id number]  LOCATION:  [facility]                         FACILITY:  [facility]  PHYSICIAN:  Lockie Pares, M.D.    DATE OF BIRTH:  Oct 08, 1965  DATE OF PROCEDURE: DATE OF DISCHARGE:                              OPERATIVE REPORT   PREOPERATIVE DIAGNOSIS:  Osteoarthritis of left knee.  POSTOPERATIVE DIAGNOSIS:  Osteoarthritis of left knee.  OPERATION: 1. Left total knee replacement, left knee. 2. Repair of patellar tendon, left knee.  SURGEON:  Lockie Pares, M.D.  ASSISTANT:  Chriss Czar, PA-C  ANESTHESIA:  General with a nerve block.  TOURNIQUET TIME:  1 hour 30 minutes.  DESCRIPTION OF PROCEDURE:  Significant valgus deformity was encountered in the knee and he also had a tibial tubercle old Osgood-Schlatter lesions met when we did the exposure.  We basically had to detach a good portion of his patellar tendon due to the valgus as well as the Osgood- Schlatter lesion, which was removed.  In doing so at the conclusion of the case, it should be noted that we repaired the patellar tendon which was not completely avulsed with 2 Mitek anchors, which is an additional part of the procedure.  Sterile prep and drape, exsanguination of the neck, flexion to 350. Straight skin incision medial parapatellar approach to the knee was made, cut 11 mm 5 degree valgus cut on the femur, followed by cutting about 4 mm below the most diseased lateral compartment with addition of provisional cut of additional 2 mm to obtain full extension, with mild flexion contracture noted preoperatively.  Significant tightness encountered in the posterolateral corner of the knee requiring posterolateral release.  We sized the femur to be a size 4, followed by placing the all-in-one cutting block with the appropriate degree of external rotation.  Cut the anterior-posterior chamfer cuts as well. Attention was next directed at the  tibia.  We cut the keel from the tibia, followed by the tibial base plate, and then a box cut for the femur.  The tibia was sized to be 5 versus a 4 on the femur.  The patella actually had been provisionally cut for additional exposure at the beginning of the case, leaving about 15-16 mm of native patella and a 38 mm all poly trial placed.  It was difficulty placing the femoral prosthesis on leading to more releasing the posterolateral aspect of the knee.  Once we did all our releases, the knee was fully extended. Excellent stability varus valgus.  Lateral release off the patella was not necessary.  We then reprobed removed the trials.  Bony surfaces were irrigated.  Inserted antibiotic impregnated cement in a doughy state in the tibia followed by femur patella.  Cement was allowed to harden with the trial bearing.  Trial bearing was removed.  No excess cement was noted.  The tourniquet was released.  No excessive bleeding was noted. A Hemovac drain was placed in the superolateral gutter.  Closure was affected after repair of the patellar tendon with #1 Ethibond, 0 and 2-0 Vicryl with skin clips.  Lightly compressive sterile dressing.  Knee immobilizer was applied.  Taken to  the recovery room in stable condition.     Lockie Pares, M.D.     WDC/MEDQ  D:  07/27/2015  T:  07/27/2015  Job:  [id number]

## 2015-07-27 NOTE — Brief Op Note (Signed)
07/27/2015  10:25 AM  PATIENT:  Mark [name]  50 y.o. male  PRE-OPERATIVE DIAGNOSIS:  OA LEFT KNEE  POST-OPERATIVE DIAGNOSIS:  OA LEFT KNEE  PROCEDURE:  Procedure(s): LEFT TOTAL KNEE ARTHROPLASTY (Left)  SURGEON:  Surgeon(s) and Role:    * Frederico Hamman, MD - Primary  PHYSICIAN ASSISTANT: Margart Sickles, PA-C  ASSISTANTS:    ANESTHESIA:   regional and general  EBL:  Total I/O In: 1300 [I.V.:1300] Out: 100 [Blood:100]  BLOOD ADMINISTERED:none  DRAINS: 1 hemovac drain lateral left knee self suction   LOCAL MEDICATIONS USED:  NONE  SPECIMEN:  No Specimen  DISPOSITION OF SPECIMEN:  N/A  COUNTS:  YES  TOURNIQUET:   Total Tourniquet Time Documented: Thigh (Left) - 90 minutes Total: Thigh (Left) - 90 minutes   DICTATION: .Other Dictation: Dictation Number unknown  PLAN OF CARE: Admit to inpatient   PATIENT DISPOSITION:  PACU - hemodynamically stable.   Delay start of Pharmacological VTE agent (>24hrs) due to surgical blood loss or risk of bleeding: yes

## 2015-07-27 NOTE — Anesthesia Preprocedure Evaluation (Addendum)
Anesthesia Evaluation  Patient identified by MRN, date of birth, ID band Patient awake    Reviewed: Allergy & Precautions, NPO status , Patient's Chart, lab work & pertinent test results  Airway Mallampati: II  TM Distance: >3 FB Neck ROM: Full    Dental   Pulmonary sleep apnea , pneumonia,    breath sounds clear to auscultation       Cardiovascular hypertension,  Rhythm:Regular Rate:Normal     Neuro/Psych    GI/Hepatic Neg liver ROS, GERD  ,  Endo/Other  negative endocrine ROS  Renal/GU negative Renal ROS     Musculoskeletal   Abdominal   Peds  Hematology   Anesthesia Other Findings   Reproductive/Obstetrics                           Anesthesia Physical Anesthesia Plan  ASA: II  Anesthesia Plan: General   Post-op Pain Management: GA combined w/ Regional for post-op pain   Induction: Intravenous  Airway Management Planned: Oral ETT  Additional Equipment:   Intra-op Plan:   Post-operative Plan:   Informed Consent:   Dental advisory given  Plan Discussed with: CRNA and Anesthesiologist  Anesthesia Plan Comments:        Anesthesia Quick Evaluation

## 2015-07-27 NOTE — H&P (View-Only) (Signed)
TOTAL KNEE ADMISSION H&P  Patient is being admitted for left total knee arthroplasty.  Subjective:  Chief Complaint:left knee pain.  HPI: Mark [name], 49 y.o. male, has a history of pain and functional disability in the left knee due to arthritis and has failed non-surgical conservative treatments for greater than 12 weeks to includeNSAID's and/or analgesics, corticosteriod injections, viscosupplementation injections, use of assistive devices, weight reduction as appropriate and activity modification.  Onset of symptoms was gradual, starting 3 years ago with gradually worsening course since that time. The patient noted prior procedures on the knee to include  arthroscopy and menisectomy on the left knee(s).  Patient currently rates pain in the left knee(s) at 10 out of 10 with activity. Patient has night pain, worsening of pain with activity and weight bearing, pain that interferes with activities of daily living, pain with passive range of motion, crepitus and joint swelling.  Patient has evidence of periarticular osteophytes and joint space narrowing by imaging studies.There is no active infection.  Patient Active Problem List   Diagnosis Date Noted  . Hypertension 06/14/2014  . Hyperlipemia 06/14/2014  . Osteoarthritis 06/14/2014  . Chronic pain 06/14/2014  . Sleep apnea 06/14/2014  . Pain in the chest 06/14/2014   Past Medical History  Diagnosis Date  . Obesity, unspecified   . Unspecified sleep apnea   . Trigger finger (acquired)   . Fatigue   . Osteoarthritis   . Hypertension   . Hyperlipemia   . Knee pain   . Shoulder pain     Past Surgical History  Procedure Laterality Date  . Gastric bypass  2011  . Carpal tunnel release Bilateral   . Shoulder surgery Left 2006  . Shoulder surgery Right 2012  . Knee arthroplasty Right 2013     (Not in a hospital admission) Allergies  Allergen Reactions  . Asa [Aspirin]     gastric bypass   . Ibuprofen     gastric bypass  .  Morphine And Related Nausea And Vomiting  . Penicillins Hives    Social History  Substance Use Topics  . Smoking status: Never Smoker   . Smokeless tobacco: Not on file  . Alcohol Use: 0.0 oz/week    0 Standard drinks or equivalent per week     Comment: 12 drinks a year    Family History  Problem Relation Age of Onset  . Heart failure Mother   . Diabetes Mother   . COPD Mother   . Cancer Father   . Arthritis Father      Review of Systems  Gastrointestinal: Positive for nausea and vomiting.  Genitourinary: Positive for frequency.  Musculoskeletal: Positive for back pain and joint pain.  Neurological: Positive for headaches.  All other systems reviewed and are negative.   Objective:  Physical Exam  Constitutional: He is oriented to person, place, and time. He appears well-developed and well-nourished. No distress.  HENT:  Head: Normocephalic and atraumatic.  Nose: Nose normal.  Eyes: Conjunctivae and EOM are normal. Pupils are equal, round, and reactive to light.  Neck: Normal range of motion. Neck supple.  Cardiovascular: Normal rate, regular rhythm, normal heart sounds and intact distal pulses.   Respiratory: Effort normal and breath sounds normal. No respiratory distress. He has no wheezes.  GI: Soft. Bowel sounds are normal. He exhibits no distension. There is no tenderness.  Musculoskeletal:       Left knee: He exhibits decreased range of motion and swelling. He exhibits no erythema, no   LCL laxity and no MCL laxity. Tenderness found.  Lymphadenopathy:    He has no cervical adenopathy.  Neurological: He is alert and oriented to person, place, and time. No cranial nerve deficit.  Skin: Skin is warm and dry. No rash noted. No erythema.  Psychiatric: He has a normal mood and affect. His behavior is normal.    Vital signs in last 24 hours: @  Labs:   Estimated body mass index is 41.32 kg/(m^2) as calculated from the following:   Height as of 06/06/15: 5'  10" (1.778 m).   Weight as of 06/06/15: 130.636 kg (288 lb).   Imaging Review Plain radiographs demonstrate severe degenerative joint disease of the left knee(s). The overall alignment ismild valgus. The bone quality appears to be good for age and reported activity level.  Assessment/Plan:  End stage arthritis, left knee   The patient history, physical examination, clinical judgment of the provider and imaging studies are consistent with end stage degenerative joint disease of the left knee(s) and total knee arthroplasty is deemed medically necessary. The treatment options including medical management, injection therapy arthroscopy and arthroplasty were discussed at length. The risks and benefits of total knee arthroplasty were presented and reviewed. The risks due to aseptic loosening, infection, stiffness, patella tracking problems, thromboembolic complications and other imponderables were discussed. The patient acknowledged the explanation, agreed to proceed with the plan and consent was signed. Patient is being admitted for inpatient treatment for surgery, pain control, PT, OT, prophylactic antibiotics, VTE prophylaxis, progressive ambulation and ADL's and discharge planning. The patient is planning to be discharged home with home health services

## 2015-07-27 NOTE — Progress Notes (Signed)
Utilization review completed.  

## 2015-07-27 NOTE — Progress Notes (Signed)
Orthopedic Tech Progress Note Patient Details:  Mark [name] 09-01-1965 [id number] Pt. refused evening CPM. Patient ID: Mark [name], male   DOB: 1965-11-12, 50 y.o.   MRN: [id number]   Mark [name] 07/27/2015, 9:03 PM

## 2015-07-27 NOTE — Anesthesia Postprocedure Evaluation (Signed)
Anesthesia Post Note  Patient: Mark [name]  Procedure(s) Performed: Procedure(s) (LRB): LEFT TOTAL KNEE ARTHROPLASTY (Left)  Patient location during evaluation: PACU Anesthesia Type: General Level of consciousness: awake Pain management: pain level controlled Vital Signs Assessment: post-procedure vital signs reviewed and stable Respiratory status: spontaneous breathing Cardiovascular status: stable Anesthetic complications: no    Last Vitals:  Filed Vitals:   07/27/15 0600  BP: 150/75  Pulse: 67  Temp: 36.8 C    Last Pain: There were no vitals filed for this visit.               EDWARDS,Theodosia Bahena

## 2015-07-27 NOTE — Interval H&P Note (Signed)
History and Physical Interval Note:  07/27/2015 7:25 AM  Mark [name]  has presented today for surgery, with the diagnosis of OA LEFT KNEE  The various methods of treatment have been discussed with the patient and family. After consideration of risks, benefits and other options for treatment, the patient has consented to  Procedure(s): LEFT TOTAL KNEE ARTHROPLASTY (Left) as a surgical intervention .  The patient's history has been reviewed, patient examined, no change in status, stable for surgery.  I have reviewed the patient's chart and labs.  Questions were answered to the patient's satisfaction.     Mark [name],W D

## 2015-07-27 NOTE — Progress Notes (Signed)
Orthopedic Tech Progress Note Patient Details:  Mark [name] January 05, 1966 [id number]  CPM Left Knee CPM Left Knee: On Left Knee Flexion (Degrees): 30 Left Knee Extension (Degrees): 0 Additional Comments: Trapeze bar and foot roll   Saul Fordyce 07/27/2015, 11:56 AM

## 2015-07-28 LAB — CBC
HEMATOCRIT: 33.1 % — AB (ref 39.0–52.0)
Hemoglobin: 11.4 g/dL — ABNORMAL LOW (ref 13.0–17.0)
MCH: 31.3 pg (ref 26.0–34.0)
MCHC: 34.4 g/dL (ref 30.0–36.0)
MCV: 90.9 fL (ref 78.0–100.0)
PLATELETS: 284 10*3/uL (ref 150–400)
RBC: 3.64 MIL/uL — ABNORMAL LOW (ref 4.22–5.81)
RDW: 12.4 % (ref 11.5–15.5)
WBC: 12.1 10*3/uL — AB (ref 4.0–10.5)

## 2015-07-28 LAB — BASIC METABOLIC PANEL
Anion gap: 10 (ref 5–15)
BUN: 6 mg/dL (ref 6–20)
CALCIUM: 8.6 mg/dL — AB (ref 8.9–10.3)
CO2: 23 mmol/L (ref 22–32)
CREATININE: 0.65 mg/dL (ref 0.61–1.24)
Chloride: 101 mmol/L (ref 101–111)
GFR calc Af Amer: >60 mL/min (ref >60)
GFR calc non Af Amer: >60 mL/min (ref >60)
GLUCOSE: 123 mg/dL — AB (ref 65–99)
Potassium: 4.1 mmol/L (ref 3.5–5.1)
Sodium: 134 mmol/L — ABNORMAL LOW (ref 135–145)

## 2015-07-28 MED ORDER — OXYCODONE HCL 5 MG PO TABS
5.0000 mg | ORAL_TABLET | ORAL | Status: DC | PRN
  Administered 2015-07-28 (×2): 10 mg via ORAL
  Administered 2015-07-29: 5 mg via ORAL
  Administered 2015-07-29 – 2015-07-30 (×10): 10 mg via ORAL
  Filled 2015-07-28 (×12): qty 2

## 2015-07-28 MED ORDER — OXYCODONE-ACETAMINOPHEN 5-325 MG PO TABS
1.0000 | ORAL_TABLET | Freq: Four times a day (QID) | ORAL | Status: DC | PRN
  Administered 2015-07-28 – 2015-07-30 (×8): 2 via ORAL
  Filled 2015-07-28 (×8): qty 2

## 2015-07-28 MED ORDER — METHOCARBAMOL 500 MG PO TABS
1000.0000 mg | ORAL_TABLET | Freq: Four times a day (QID) | ORAL | Status: DC
  Administered 2015-07-28 – 2015-07-30 (×9): 1000 mg via ORAL
  Filled 2015-07-28 (×8): qty 2

## 2015-07-28 MED ORDER — OXYCODONE HCL 5 MG PO TABS
5.0000 mg | ORAL_TABLET | Freq: Four times a day (QID) | ORAL | Status: DC | PRN
  Administered 2015-07-28 (×2): 10 mg via ORAL
  Filled 2015-07-28 (×3): qty 2

## 2015-07-28 NOTE — Evaluation (Signed)
Occupational Therapy Evaluation Patient Details Name: Mark [name] MRN: [id number] DOB: Mar 28, 1966 Today's Date: 07/28/2015    History of Present Illness 50 y.o. male s/p left total knee aprthroplasty. Hx of HTN, obesity, sleep apnea, chronic pain, pain in chest, hyperlipidemia, osteoarthritis, bilateral shoulder surgery, knee arthroscopy.   Clinical Impression   Pt s/p above. Pt independent with ADLs, PTA. Feel pt will benefit from acute OT to increase independence prior to d/c. Plan to practice LB dressing next session.    Follow Up Recommendations  No OT follow up;Supervision - Intermittent    Equipment Recommendations  Other (comment) (AE-reacher, long sponge, long shoehorn, sock aid)    Recommendations for Other Services       Precautions / Restrictions Precautions Precautions: Knee;Fall Precaution Booklet Issued: No Precaution Comments: educated on knee precautions Required Braces or Orthoses: Knee Immobilizer - Left Knee Immobilizer - Left: Other (comment) (when walking until block wears off) Restrictions Weight Bearing Restrictions: Yes LLE Weight Bearing: Weight bearing as tolerated      Mobility Bed Mobility Overal bed mobility: Needs Assistance Bed Mobility: Supine to Sit     Supine to sit: Min assist   General bed mobility comments: assist with LE  Transfers Overall transfer level: Needs assistance Equipment used: Rolling walker (2 wheeled) Transfers: Sit to/from Stand Sit to Stand: Min guard            Balance      Used RW for ambulation-Min guard given.                                      ADL Overall ADL's : Needs assistance/impaired   Eating/Feeding Details (indicate cue type and reason): drank liquid while standing with supervision                 Lower Body Dressing: Maximal assistance;Sit to/from stand   Toilet Transfer: Min guard;Ambulation;RW (sit to stand from bed)           Functional mobility  during ADLs: Min guard;Rolling walker General ADL Comments: Educated on benefit of reaching to Lt foot for LB dressing as it allows knee to bend. Educated on LB dressing technique. Talked about shower transfer and explained 3 in 1 could be used as shower chair. Talked about purpose of bone foam.     Vision     Perception     Praxis      Pertinent Vitals/Pain Pain Assessment: 0-10 Pain Score: 6  Pain Location: left knee Pain Descriptors / Indicators: Aching;Throbbing Pain Intervention(s): Monitored during session;Limited activity within patient's tolerance     Hand Dominance     Extremity/Trunk Assessment Upper Extremity Assessment Upper Extremity Assessment: Overall WFL for tasks assessed (history of bilateral shoulder surgery)   Lower Extremity Assessment Lower Extremity Assessment: Defer to PT evaluation       Communication Communication Communication: No difficulties   Cognition Arousal/Alertness: Awake/alert Behavior During Therapy: WFL for tasks assessed/performed Overall Cognitive Status: Within Functional Limits for tasks assessed                     General Comments          Shoulder Instructions      Home Living Family/patient expects to be discharged to:: Private residence Living Arrangements: Spouse/significant other;Children Available Help at Discharge: Family;Available 24 hours/day Type of Home: House Home Access: Stairs to enter [employer] of Steps:  6 Entrance Stairs-Rails: Right;Left Home Layout: One level     Bathroom Shower/Tub: Walk-in shower;Door         Home Equipment: Walker - 2 wheels; BSC already delivered to room          Prior Functioning/Environment Level of Independence: Independent with assistive device(s)        Comments: using walker occasionally for ambulation    OT Diagnosis: Acute pain   OT Problem List: Decreased range of motion;Decreased activity tolerance;Impaired balance (sitting and/or  standing);Pain;Obesity;Decreased knowledge of precautions;Decreased knowledge of use of DME or AE   OT Treatment/Interventions: Self-care/ADL training;DME and/or AE instruction;Therapeutic activities;Patient/family education;Balance training    OT Goals(Current goals can be found in the care plan section) Acute Rehab OT Goals Patient Stated Goal: not stated OT Goal Formulation: With patient Time For Goal Achievement: 08/04/15 Potential to Achieve Goals: Good ADL Goals Pt Will Perform Lower Body Dressing: with set-up;with supervision;with adaptive equipment;sit to/from stand Pt Will Transfer to Toilet: ambulating;with set-up;with supervision (3 in 1 over commode) Pt Will Perform Toileting - Clothing Manipulation and hygiene: sit to/from stand;with supervision Pt Will Perform Tub/Shower Transfer: Shower transfer;ambulating;3 in 1;rolling walker;with min guard assist  OT Frequency: Min 2X/week   Barriers to D/C:            Co-evaluation              End of Session Equipment Utilized During Treatment: Gait belt;Rolling walker CPM Left Knee CPM Left Knee: Off  Activity Tolerance: Patient limited by pain Patient left: in chair;with call bell/phone within reach   Time: 6045-4098 OT Time Calculation (min): 16 min Charges:  OT General Charges $OT Visit: 1 Procedure OT Evaluation $OT Eval Moderate Complexity: 1 Procedure G-CodesEarlie Raveling OTR/L [phone] 07/28/2015, 2:38 PM

## 2015-07-28 NOTE — Progress Notes (Signed)
Physical Therapy Treatment Patient Details Name: Mark [name] MRN: [id number] DOB: 1966-06-15 Today's Date: 07/28/2015    History of Present Illness 50 y.o. male s/p left total knee aprthroplasty. Hx of HTN, obesity, sleep apnea, and chronic pain.    PT Comments    Patient making slow progress with mobility and gait.  Pain is significant limiting factor.  Follow Up Recommendations  Home health PT;Supervision for mobility/OOB     Equipment Recommendations  None recommended by PT    Recommendations for Other Services       Precautions / Restrictions Precautions Precautions: Knee;Fall Precaution Comments: Reviewed knee precautions including zero knee use Required Braces or Orthoses: Knee Immobilizer - Left Knee Immobilizer - Left: On when out of bed or walking;Other (comment) (Until block wears off) Restrictions Weight Bearing Restrictions: Yes LLE Weight Bearing: Weight bearing as tolerated    Mobility  Bed Mobility Overal bed mobility: Needs Assistance Bed Mobility: Sit to Supine       Sit to supine: Min assist   General bed mobility comments: Verbal cues for technique.  Assist to bring LLE onto bed to return to supine  Transfers Overall transfer level: Needs assistance Equipment used: Rolling walker (2 wheeled) Transfers: Sit to/from Stand Sit to Stand: Min assist         General transfer comment: Verbal cues for hand placement.  Min assist to rise to standing from low recliner.  Ambulation/Gait Ambulation/Gait assistance: Min guard Ambulation Distance (Feet): 12 Feet Assistive device: Rolling walker (2 wheeled) Gait Pattern/deviations: Step-to pattern;Decreased stance time - left;Decreased step length - right;Decreased step length - left;Decreased stride length;Decreased weight shift to left;Antalgic Gait velocity: decreased Gait velocity interpretation: Below normal speed for age/gender General Gait Details: Verbal cues to stand upright during gait.   Patient demonstrating safe use of RW and correct gait sequence.  Pain limiting distance.   Stairs            Wheelchair Mobility    Modified Rankin (Stroke Patients Only)       Balance                                    Cognition Arousal/Alertness: Awake/alert Behavior During Therapy: WFL for tasks assessed/performed Overall Cognitive Status: Within Functional Limits for tasks assessed                      Exercises Total Joint Exercises Ankle Circles/Pumps: AROM;Both;10 reps;Supine Quad Sets: AROM;Left;10 reps;Supine Short Arc Quad: AROM;Left;5 reps;Supine    General Comments        Pertinent Vitals/Pain Pain Assessment: 0-10 Pain Score: 9  (with mobility) Pain Location: Lt knee Pain Descriptors / Indicators: Aching;Throbbing Pain Intervention(s): Limited activity within patient's tolerance;Monitored during session;Repositioned (RN had given meds prior to session)    Home Living                      Prior Function            PT Goals (current goals can now be found in the care plan section) Progress towards PT goals: Progressing toward goals    Frequency  7X/week    PT Plan Current plan remains appropriate    Co-evaluation             End of Session Equipment Utilized During Treatment: Gait belt Activity Tolerance: Patient limited by pain Patient left: in bed;with  call bell/phone within reach     Time: 1151-1209 PT Time Calculation (min) (ACUTE ONLY): 18 min  Charges:  $Gait Training: 8-22 mins                    G Codes:      Vena Austria 08-13-2015, 1:00 PM Durenda Hurt. Renaldo Fiddler, [facility] Acute Rehab Services Pager [phone]

## 2015-07-28 NOTE — Progress Notes (Signed)
Orthopaedic Trauma Service (OTS)   Subjective: Patient reports pain as severe.  H/o chronic med use.  Objective: Temp:  [97.6 F (36.4 C)-99.2 F (37.3 C)] 98.5 F (36.9 C) (02/04 0400) Pulse Rate:  [67-93] 81 (02/04 0400) Resp:  [9-21] 16 (02/04 0400) BP: (147-162)/(66-90) 155/86 mmHg (02/04 0400) SpO2:  [97 %-100 %] 97 % (02/04 0400) Physical Exam Using own pain patches right chest LLE Dressing intact, clean, dry  Knee flexed to 30 degreees  Edema/ swelling controlled  Sens: DPN, SPN, TN intact  Motor: EHL, FHL, and lessor toe ext and flex all intact grossly  Brisk cap refill, warm to touch   Assessment/Plan: 1 Day Post-Op Procedure(s) (LRB): LEFT TOTAL KNEE ARTHROPLASTY (Left) Reviewed importance of straightening knee Will increase pain meds WBAT w PT aggressive extension Lovenox D/c likely Monday  Myrene Galas, MD [facility] [phone] (p)

## 2015-07-29 LAB — CBC
HEMATOCRIT: 32 % — AB (ref 39.0–52.0)
HEMOGLOBIN: 10.9 g/dL — AB (ref 13.0–17.0)
MCH: 31.3 pg (ref 26.0–34.0)
MCHC: 34.1 g/dL (ref 30.0–36.0)
MCV: 92 fL (ref 78.0–100.0)
Platelets: 256 10*3/uL (ref 150–400)
RBC: 3.48 MIL/uL — ABNORMAL LOW (ref 4.22–5.81)
RDW: 12.5 % (ref 11.5–15.5)
WBC: 11.7 10*3/uL — AB (ref 4.0–10.5)

## 2015-07-29 NOTE — Progress Notes (Signed)
Physical Therapy Treatment Patient Details Name: Mark [name] MRN: [id number] DOB: [birth year] Today's Date: 07/29/2015    History of Present Illness 50 y.o. male s/p left total knee aprthroplasty. Hx of HTN, obesity, sleep apnea, chronic pain, pain in chest, hyperlipidemia, osteoarthritis, bilateral shoulder surgery, knee arthroscopy.    PT Comments    Patient making good gains with mobility and gait.  Pain remains limiting factor.  Follow Up Recommendations  Home health PT;Supervision for mobility/OOB     Equipment Recommendations  None recommended by PT    Recommendations for Other Services       Precautions / Restrictions Precautions Precautions: Knee;Fall Precaution Booklet Issued: No Precaution Comments: Reviewed knee precautions Required Braces or Orthoses:  (Discontinued - block worn off) Knee Immobilizer - Left: Other (comment) (when walking until block wears off) Restrictions Weight Bearing Restrictions: Yes LLE Weight Bearing: Weight bearing as tolerated    Mobility  Bed Mobility Overal bed mobility: Needs Assistance Bed Mobility: Supine to Sit     Supine to sit: Min assist     General bed mobility comments: Assist to bring LLE off of bed.  Transfers Overall transfer level: Needs assistance Equipment used: Rolling walker (2 wheeled) Transfers: Sit to/from Stand Sit to Stand: Min guard         General transfer comment: Assist for safety only.  Ambulation/Gait Ambulation/Gait assistance: Min guard Ambulation Distance (Feet): 64 Feet Assistive device: Rolling walker (2 wheeled) Gait Pattern/deviations: Step-to pattern;Decreased stance time - left;Decreased step length - right;Decreased stride length;Decreased weight shift to left;Antalgic Gait velocity: decreased Gait velocity interpretation: Below normal speed for age/gender General Gait Details: Verbal cues to stand upright during gait.  Patient demonstrating safe use of RW and correct gait  sequence.  Pain limiting distance.   Stairs            Wheelchair Mobility    Modified Rankin (Stroke Patients Only)       Balance                                    Cognition Arousal/Alertness: Awake/alert Behavior During Therapy: WFL for tasks assessed/performed Overall Cognitive Status: Within Functional Limits for tasks assessed                      Exercises Total Joint Exercises Ankle Circles/Pumps: AROM;Both;10 reps;Seated Quad Sets: AROM;Left;10 reps;Seated Short Arc Quad: AROM;Left;10 reps;Seated Heel Slides: AAROM;Left;10 reps;Seated Hip ABduction/ADduction: AROM;Left;10 reps;Seated    General Comments        Pertinent Vitals/Pain Pain Assessment: 0-10 Pain Score: 6  Pain Location: Lt knee Pain Descriptors / Indicators: Aching;Sore Pain Intervention(s): Limited activity within patient's tolerance;Monitored during session;Repositioned    Home Living                      Prior Function            PT Goals (current goals can now be found in the care plan section) Acute Rehab PT Goals Patient Stated Goal: not stated Progress towards PT goals: Progressing toward goals    Frequency  7X/week    PT Plan Current plan remains appropriate    Co-evaluation             End of Session Equipment Utilized During Treatment: Gait belt Activity Tolerance: Patient limited by pain Patient left: in chair;with call bell/phone within reach     Time:  7829-5621 PT Time Calculation (min) (ACUTE ONLY): 26 min  Charges:  $Gait Training: 8-22 mins $Therapeutic Exercise: 8-22 mins                    G Codes:      Vena Austria August 28, 2015, 1:46 PM Durenda Hurt. Renaldo Fiddler, [facility] Acute Rehab Services Pager [phone]

## 2015-07-29 NOTE — Progress Notes (Signed)
Physical Therapy Treatment Patient Details Name: Mark [name] MRN: [id number] DOB: 08/17/1965 Today's Date: 07/29/2015    History of Present Illness 50 y.o. male s/p left total knee aprthroplasty. Hx of HTN, obesity, sleep apnea, chronic pain, pain in chest, hyperlipidemia, osteoarthritis, bilateral shoulder surgery, knee arthroscopy.    PT Comments    Patient making slow progress with mobility and gait.  Pain continues to limit progress.  Follow Up Recommendations  Home health PT;Supervision for mobility/OOB     Equipment Recommendations  None recommended by PT    Recommendations for Other Services       Precautions / Restrictions Precautions Precautions: Knee;Fall Precaution Booklet Issued: No Precaution Comments: Reviewed knee precautions Required Braces or Orthoses:  (Discontinued - block worn off) Knee Immobilizer - Left: Discontinued - block worn off Restrictions Weight Bearing Restrictions: Yes LLE Weight Bearing: Weight bearing as tolerated    Mobility  Bed Mobility Overal bed mobility: Needs Assistance Bed Mobility: Supine to Sit;Sit to Supine     Supine to sit: Min guard Sit to supine: Min assist   General bed mobility comments: Patient able to use RLE to move LLE off of bed, requiring min guard assist for safety to move to sitting.  Required min assist to bring LLE on to bed to return to supine.  Transfers Overall transfer level: Needs assistance Equipment used: Rolling walker (2 wheeled) Transfers: Sit to/from Stand Sit to Stand: Min guard         General transfer comment: Assist for safety only.  Ambulation/Gait Ambulation/Gait assistance: Min guard Ambulation Distance (Feet): 82 Feet Assistive device: Rolling walker (2 wheeled) Gait Pattern/deviations: Step-to pattern;Decreased stance time - left;Decreased step length - right;Decreased stride length;Decreased weight shift to left;Antalgic Gait velocity: decreased Gait velocity  interpretation: Below normal speed for age/gender General Gait Details: Verbal cues to stand upright and to put Lt heel on floor in stance.  Distance limited by pain.   Stairs            Wheelchair Mobility    Modified Rankin (Stroke Patients Only)       Balance                                    Cognition Arousal/Alertness: Awake/alert Behavior During Therapy: WFL for tasks assessed/performed Overall Cognitive Status: Within Functional Limits for tasks assessed                      Exercises Total Joint Exercises Ankle Circles/Pumps: AROM;Both;10 reps;Supine Quad Sets: AROM;Left;10 reps;Supine Short Arc Quad: AROM;Left;10 reps;Supine Heel Slides: AAROM;Left;10 reps;Seated Hip ABduction/ADduction: AROM;Left;10 reps;Supine Long Arc Quad: Limitations Long [employer] Limitations: Attempted in sitting and unable to move foot off of floor Knee Flexion: AROM;Left;5 reps;Seated Goniometric ROM: -20* ext;  50* flexion    General Comments        Pertinent Vitals/Pain Pain Assessment: 0-10 Pain Score: 8  Pain Location: Lt knee Pain Descriptors / Indicators: Aching;Crying;Grimacing;Guarding;Sharp;Sore Pain Intervention(s): Limited activity within patient's tolerance;Monitored during session;Repositioned    Home Living                      Prior Function            PT Goals (current goals can now be found in the care plan section) Acute Rehab PT Goals Patient Stated Goal: not stated Progress towards PT goals: Progressing toward goals  Frequency  7X/week    PT Plan Current plan remains appropriate    Co-evaluation             End of Session Equipment Utilized During Treatment: Gait belt Activity Tolerance: Patient limited by pain Patient left: in bed;with call bell/phone within reach;with family/visitor present     Time: 5784-6962 PT Time Calculation (min) (ACUTE ONLY): 23 min  Charges:  $Gait Training: 8-22  mins $Therapeutic Exercise: 8-22 mins                    G Codes:      Vena Austria 07-31-15, 4:17 PM Durenda Hurt. Renaldo Fiddler, [facility] Acute Rehab Services Pager [phone]

## 2015-07-29 NOTE — Progress Notes (Addendum)
Occupational Therapy Treatment Patient Details Name: Mark [name] MRN: [id number] DOB: 12/17/1965 Today's Date: 07/29/2015    History of present illness 50 y.o. male s/p left total knee aprthroplasty. Hx of HTN, obesity, sleep apnea, chronic pain, pain in chest, hyperlipidemia, osteoarthritis, bilateral shoulder surgery, knee arthroscopy.   OT comments  Pt progressing. Education provided in session.   Follow Up Recommendations  No OT follow up;Supervision - Intermittent    Equipment Recommendations  Other (comment) (AE-reacher, long sponge, long shoehorn, sock aid)    Recommendations for Other Services      Precautions / Restrictions Precautions Precautions: Knee;Fall Precaution Booklet Issued: No Required Braces or Orthoses: Knee Immobilizer - Left Knee Immobilizer - Left: Other (comment) (when walking until block wears off) Restrictions Weight Bearing Restrictions: Yes LLE Weight Bearing: Weight bearing as tolerated       Mobility Bed Mobility               General bed mobility comments: not assessed  Transfers Overall transfer level: Needs assistance Equipment used: Rolling walker (2 wheeled) Transfers: Sit to/from Stand Sit to Stand: Supervision (and set up for RW)              Balance  Used RW for ambulation. Able to perform functional task while standing without LOB-RW in front.                                 ADL Overall ADL's : Needs assistance/impaired     Grooming: Sitting;Set up Grooming Details (indicate cue type and reason): wiped off face with washcloth             Lower Body Dressing: With adaptive equipment;Sitting/lateral leans;Maximal assistance Lower Body Dressing Details (indicate cue type and reason): sock aid too small, so OT donned sock for pt, assisted in doffing as well Toilet Transfer: Ambulation;RW;Comfort height toilet;Supervision/safety;Set up (set up for RW) Toilet Transfer Details (indicate cue type  and reason): stood at toilet and tried to urinate Toileting- Water quality scientist and Hygiene: Supervision/safety (standing)       Functional mobility during ADLs: Supervision/safety;Rolling walker (also set up for RW) General ADL Comments: Reviewed LB dressing technique. Educated on AE and pt practiced. Educated on shower transfer technique. Educated on safety such as use of bag on walker.      Vision                     Perception     Praxis      Cognition  Awake/Alert Behavior During Therapy: WFL for tasks assessed/performed Overall Cognitive Status: Within Functional Limits for tasks assessed                       Extremity/Trunk Assessment               Exercises     Shoulder Instructions       General Comments      Pertinent Vitals/ Pain       Pain Assessment: 0-10 Pain Score:  (6-7) Pain Location: LLE Pain Intervention(s): Monitored during session;Repositioned  Home Living                                          Prior Functioning/Environment  Frequency Min 2X/week     Progress Toward Goals  OT Goals(current goals can now be found in the care plan section)  Progress towards OT goals: Progressing toward goals-edited shower transfer goal  Acute Rehab OT Goals Patient Stated Goal: not stated OT Goal Formulation: With patient Time For Goal Achievement: 08/04/15 Potential to Achieve Goals: Good ADL Goals Pt Will Perform Lower Body Dressing: with set-up;with supervision;with adaptive equipment;sit to/from stand Pt Will Transfer to Toilet: ambulating;with set-up;with supervision (3 in 1 over commode)-met goal Pt Will Perform Toileting - Clothing Manipulation and hygiene: sit to/from stand;with supervision Pt Will Perform Tub/Shower Transfer: Shower transfer;ambulating;shower seat;rolling walker;with min guard assist  Plan Discharge plan remains appropriate    Co-evaluation                  End of Session Equipment Utilized During Treatment: Rolling walker;Other (comment) (AE)   Activity Tolerance Patient limited by fatigue (had PT shortly before OT session)   Patient Left in chair;with call bell/phone within reach   Nurse Communication          Time: 6599-3570 OT Time Calculation (min): 16 min  Charges: OT General Charges $OT Visit: 1 Procedure OT Treatments $Self Care/Home Management : 8-22 mins  Benito Mccreedy OTR/L [phone] 07/29/2015, 12:49 PM

## 2015-07-29 NOTE — Progress Notes (Signed)
Orthopaedic Trauma Service Progress Note  Subjective  Doing ok Pain improving No other complaints  Denies CP or SOB   ROS As above  Objective   BP 135/69 mmHg  Pulse 89  Temp(Src) 98.7 F (37.1 C) (Oral)  Resp 18  Ht  (1.778 m)  Wt 131.543 kg (290 lb)  BMI 41.61 kg/m2  SpO2 97%  Intake/Output      02/04 0701 - 02/05 0700 02/05 0701 - 02/06 0700   P.O. 120    I.V. (mL/kg)     IV Piggyback     Total Intake(mL/kg) 120 (0.9)    Urine (mL/kg/hr) 1175 (0.4)    Drains 100 (0)    Blood     Total Output 1275     Net -1155            Labs Results for Mark [name], Mark [name] (MRN [id number]) as of 07/29/2015 09:59  Ref. Range 07/29/2015 03:40  WBC Latest Ref Range: 4.0-10.5 K/uL 11.7 (H)  RBC Latest Ref Range: 4.22-5.81 MIL/uL 3.48 (L)  Hemoglobin Latest Ref Range: 13.0-17.0 g/dL 40.9 (L)  HCT Latest Ref Range: 39.0-52.0 % 32.0 (L)  MCV Latest Ref Range: 78.0-100.0 fL 92.0  MCH Latest Ref Range: 26.0-34.0 pg 31.3  MCHC Latest Ref Range: 30.0-36.0 g/dL 81.1  RDW Latest Ref Range: 11.5-15.5 % 12.5  Platelets Latest Ref Range: 150-400 K/uL 256     Exam  Gen: resting comfortably in bed, NAD Ext:       Left Lower Extremity   Dressing removed  Incision looks excellent  No drainage or erythema  Drain dc'd w/o incident  Distal motor and sensory functions intact  Ext warm  + DP pulse  No DCT   Compartments soft  Swelling stable    Assessment and Plan   POD/HD#: 2   1. Left knee DJD s/p L TKA  WBAT   Dressing changed  Continue with CPM and bone foam  PT/OT  Ice and elevate  2. Pain management:  Continue with current regimen   3. Hemodynamics  Stable  4. Medical issues   Stable   Continue home meds   5. DVT/PE prophylaxis:  Lovenox   6. Activity:  Up as tolerated  7. FEN/GI prophylaxis/Foley/Lines:  Diet as tolerated   8. Dispo:  Dc home tomorrow     Mearl Latin, PA-C [facility] [phone]  (O) 07/29/2015 9:59 AM

## 2015-07-30 LAB — CBC
HEMATOCRIT: 30.7 % — AB (ref 39.0–52.0)
Hemoglobin: 10.4 g/dL — ABNORMAL LOW (ref 13.0–17.0)
MCH: 31.1 pg (ref 26.0–34.0)
MCHC: 33.9 g/dL (ref 30.0–36.0)
MCV: 91.9 fL (ref 78.0–100.0)
PLATELETS: 258 10*3/uL (ref 150–400)
RBC: 3.34 MIL/uL — AB (ref 4.22–5.81)
RDW: 12.4 % (ref 11.5–15.5)
WBC: 11.5 10*3/uL — AB (ref 4.0–10.5)

## 2015-07-30 NOTE — Care Management Note (Addendum)
Case Management Note  Patient Details  Name: Mark [name] MRN: [id number] Date of Birth: June 03, 1966  Subjective/Objective:            S/p left total knee arthroplasty        Action/Plan: Spoke with patient, worker's comp case manager is Clifton Custard [phone]. Contacted Ms Donald Pore, she requested HHPT order and discharge note, faxed to [phone]. She will try to set up patient with [facility] and call me once patient is set up. Information faxed and confirmation received. [employer] has delivered 3N1 and they will deliver CPM to home, patient already has a rolling walker at home.  Received call from [facility] with [employer], patient has been set up with [facility] with start of service 07/31/15.  Expected Discharge Date:                  Expected Discharge Plan:     In-House Referral:     Discharge planning Services     Post Acute Care Choice:    Choice offered to:     DME Arranged:    DME Agency:     HH Arranged:    HH Agency:     Status of Service:     Medicare Important Message Given:    Date Medicare IM Given:    Medicare IM give by:    Date Additional Medicare IM Given:    Additional Medicare Important Message give by:     If discussed at Long Length of Stay Meetings, dates discussed:    Additional Comments:  Monica Becton, RN 07/30/2015, 10:49 AM

## 2015-07-30 NOTE — Progress Notes (Signed)
Occupational Therapy Treatment Patient Details Name: Mark [name] MRN: [id number] DOB: 10-Jan-1966 Today's Date: 07/30/2015    History of present illness 50 y.o. male s/p left total knee aprthroplasty. Hx of HTN, obesity, sleep apnea, chronic pain, pain in chest, hyperlipidemia, osteoarthritis, bilateral shoulder surgery, knee arthroscopy.   OT comments  Pt supine in bed upon entering the room with 8/10 pain in L knee. Pt declined OOB tasks and transfers this session. OT continued education regarding energy conservation,pain management, and use of AE in order to increase I and safety. OT educating pt on discharge recommendations as well and answered questions regarding HHPT recommendation.  Pt verbalized understanding and states he has no additional concerns at this time.   Follow Up Recommendations  No OT follow up;Supervision - Intermittent    Equipment Recommendations  Other (comment) (AE - LH reacher, sock aide, shoe horn, sponge)    Recommendations for Other Services      Precautions / Restrictions Precautions Precautions: Knee;Fall Precaution Comments:  Required Braces or Orthoses:  (Discontinued - block worn off) Restrictions Weight Bearing Restrictions: Yes LLE Weight Bearing: Weight bearing as tolerated       Mobility Bed Mobility Overal bed mobility: Needs Assistance Bed Mobility: Supine to Sit;Sit to Supine     Supine to sit: Supervision Sit to supine: Supervision   General bed mobility comments: Slow, less efficient movements, but not needing physical assist; handrail down; tends to use RLE to assist LLE  Transfers Overall transfer level: Needs assistance Equipment used: Rolling walker (2 wheeled) Transfers: Sit to/from Stand Sit to Stand: Supervision         General transfer comment: cues for control, hand palcement    Balance Overall balance assessment: Needs assistance           Standing balance-Leahy Scale: Good                      ADL Overall ADL's : Needs assistance/impaired          General ADL Comments: Pt declined OOB tasks this session secondary to increased pain. OT reviewed AE for LB self care again. Pt has not yet purchased and education given regarding importance of equipment. Pt was able to come into long sitting and don/doff R sock and partially don/doff L sock. OT educated pt on energy conservation and pain management techniques for upcoming doctors appointments. OT educated pt on techniques and tips for safety while in community. Pt verbalized understanding.       Vision   Perception   Praxis    Cognition   Behavior During Therapy: WFL for tasks assessed/performed Overall Cognitive Status: Within Functional Limits for tasks assessed         Extremity/Trunk Assessment   Exercises   Shoulder Instructions   General Comments     Pertinent Vitals/ Pain       Pain Assessment: 0-10 Pain Score: 8  Pain Location: L knee Pain Descriptors / Indicators: Aching;Grimacing Pain Intervention(s): Monitored during session;Limited activity within patient's tolerance;RN gave pain meds during session  Home Living   Prior Functioning/Environment    Frequency Min 2X/week     Progress Toward Goals  OT Goals(current goals can now be found in the care plan section)  Progress towards OT goals: Progressing toward goals  Acute Rehab OT Goals Patient Stated Goal: Hopes to go home today  Plan Discharge plan remains appropriate    Co-evaluation      End of Session   Activity  Tolerance Patient limited by pain   Patient Left in bed;with call bell/phone within reach   Nurse Communication          Time: 2956-2130 OT Time Calculation (min): 12 min  Charges: OT General Charges $OT Visit: 1 Procedure OT Treatments $Self Care/Home Management : 8-22 mins  Lowella Grip 07/30/2015, 10:52 AM

## 2015-07-30 NOTE — Progress Notes (Signed)
Physical Therapy Treatment Patient Details Name: Mark [name] MRN: [id number] DOB: [birth year] Today's Date: 07/30/2015    History of Present Illness 50 y.o. male s/p left total knee aprthroplasty. Hx of HTN, obesity, sleep apnea, chronic pain, pain in chest, hyperlipidemia, osteoarthritis, bilateral shoulder surgery, knee arthroscopy.    PT Comments    Continuing progress, stair training done; OK for dc home from PT standpoint -- all functional mobility activities checked off; if he is still here for a second session, would like to focus on therex and get ROM measurements  Follow Up Recommendations  Home health PT;Supervision for mobility/OOB     Equipment Recommendations  None recommended by PT    Recommendations for Other Services       Precautions / Restrictions Precautions Precautions: Knee;Fall Precaution Comments: Pt educated to not allow any pillow or bolster under knee for healing with optimal range of motion.  Required Braces or Orthoses:  (Discontinued - block worn off) Restrictions LLE Weight Bearing: Weight bearing as tolerated    Mobility  Bed Mobility Overal bed mobility: Needs Assistance Bed Mobility: Supine to Sit;Sit to Supine     Supine to sit: Supervision Sit to supine: Supervision   General bed mobility comments: Slow, less efficient movements, but not needing physical assist; handrail down; tends to use RLE to assist LLE  Transfers Overall transfer level: Needs assistance Equipment used: Rolling walker (2 wheeled) Transfers: Sit to/from Stand Sit to Stand: Supervision         General transfer comment: cues for control, hand palcement  Ambulation/Gait Ambulation/Gait assistance: Min guard Ambulation Distance (Feet): 30 Feet (5+25) Assistive device: Rolling walker (2 wheeled) Gait Pattern/deviations: Step-to pattern;Decreased step length - right;Decreased stance time - left;Antalgic Gait velocity: decreased   General Gait Details: Cues  to activate L quad for stance stability; cues alos for optimal RW placement   Stairs Stairs: Yes Stairs assistance: Min guard Stair Management: One rail Left;Step to pattern;Sideways Number of Stairs: 5 General stair comments: verbal and demo cues for sequence; slow, but managing well; very painful post stairs  Wheelchair Mobility    Modified Rankin (Stroke Patients Only)       Balance Overall balance assessment: Needs assistance           Standing balance-Leahy Scale: Good                      Cognition Arousal/Alertness: Awake/alert Behavior During Therapy: WFL for tasks assessed/performed Overall Cognitive Status: Within Functional Limits for tasks assessed                      Exercises      General Comments  in too much pain for therex and ROM measurements      Pertinent Vitals/Pain Pain Assessment: 0-10 Pain Score: 8  Pain Location: L knee; near tears after practicing stairs Pain Descriptors / Indicators: Aching;Crying;Grimacing Pain Intervention(s): Monitored during session;RN gave pain meds during session    Home Living                      Prior Function            PT Goals (current goals can now be found in the care plan section) Acute Rehab PT Goals Patient Stated Goal: Hopes to go home today PT Goal Formulation: With patient Time For Goal Achievement: 08/03/15 Potential to Achieve Goals: Good Progress towards PT goals: Progressing toward goals    Frequency  7X/week    PT Plan Current plan remains appropriate    Co-evaluation             End of Session Equipment Utilized During Treatment: Gait belt Activity Tolerance: Patient tolerated treatment well;Patient limited by pain Patient left: in chair;with call bell/phone within reach;with nursing/sitter in room     Time: 0830-0905 PT Time Calculation (min) (ACUTE ONLY): 35 min  Charges:  $Gait Training: 8-22 mins                    G Codes:       Olen Pel 07/30/2015, 9:18 AM  Van Clines, PT  Acute Rehabilitation Services Pager [phone] Office [phone]

## 2015-07-30 NOTE — Discharge Summary (Signed)
PATIENT ID: Mark [name]        MRN:  [id number]          DOB/AGE: Feb 06, 1966 / 50 y.o.    DISCHARGE SUMMARY  ADMISSION DATE:    07/27/2015 DISCHARGE DATE:   07/30/2015   ADMISSION DIAGNOSIS: OA LEFT KNEE    DISCHARGE DIAGNOSIS:  OA LEFT KNEE    ADDITIONAL DIAGNOSIS: Active Problems:   Primary localized osteoarthritis of left knee  Past Medical History  Diagnosis Date  . Obesity, unspecified   . Trigger finger (acquired)   . Fatigue   . Osteoarthritis   . Hyperlipemia   . Knee pain   . Shoulder pain   . Unspecified sleep apnea     uses cpap  . Pneumonia   . Depression   . Anxiety   . GERD (gastroesophageal reflux disease)     heartburn occ - better since having gastric bypass  . Headache     migraines - none for years, sinus headaches at times  . Anemia     low iron  . Astigmatism of left eye     PROCEDURE: Procedure(s): LEFT TOTAL KNEE ARTHROPLASTY Left on 07/27/2015  CONSULTS: PT/OT      HISTORY:  See H&P in chart  HOSPITAL COURSE:  Mark [name] is a 50 y.o. admitted on 07/27/2015 and found to have a diagnosis of OA LEFT KNEE.  After appropriate laboratory studies were obtained  they were taken to the operating room on 07/27/2015 and underwent  Procedure(s): LEFT TOTAL KNEE ARTHROPLASTY  Left.   They were given perioperative antibiotics:  Anti-infectives    Start     Dose/Rate Route Frequency Ordered Stop   07/27/15 1800  vancomycin (VANCOCIN) IVPB 1000 mg/200 mL premix     1,000 mg 200 mL/hr over 60 Minutes Intravenous Every 12 hours 07/27/15 1243 07/27/15 1806   07/27/15 0600  vancomycin (VANCOCIN) 1,500 mg in sodium chloride 0.9 % 500 mL IVPB     1,500 mg 250 mL/hr over 120 Minutes Intravenous On call to O.R. 07/26/15 1309 07/27/15 0815    .  Tolerated the procedure well.    POD #1, allowed out of bed to a chair.  PT for ambulation and exercise program.  IV saline locked.  O2 discontionued.  POD #2, continued PT and ambulation.   Hemovac  pulled. . The remainder of the hospital course was dedicated to ambulation and strengthening.   The patient was discharged on 3 Days Post-Op in  Stable condition.  Blood products given:none  DIAGNOSTIC STUDIES: Recent vital signs: Patient Vitals for the past 24 hrs:  BP Temp Temp src Pulse Resp SpO2  07/30/15 0424 113/78 mmHg 99.1 F (37.3 C) Oral 99 18 95 %  07/29/15 1957 123/77 mmHg 99.6 F (37.6 C) Oral (!) 110 18 99 %  07/29/15 1300 135/74 mmHg 99 F (37.2 C) - 89 20 100 %       Recent laboratory studies:  Recent Labs  07/27/15 1307 07/28/15 0539 07/29/15 0340 07/30/15 0304  WBC 16.0* 12.1* 11.7* 11.5*  HGB 12.0* 11.4* 10.9* 10.4*  HCT 34.6* 33.1* 32.0* 30.7*  PLT 280 284 256 258    Recent Labs  07/27/15 1307 07/28/15 0539  NA  --  134*  K  --  4.1  CL  --  101  CO2  --  23  BUN  --  6  CREATININE 0.74 0.65  GLUCOSE  --  123*  CALCIUM  --  8.6*   Lab Results  Component Value Date   INR 1.08 07/16/2015     Recent Radiographic Studies :  Dg Chest 2 View  07/16/2015  CLINICAL DATA:  Cough. EXAM: CHEST  2 VIEW COMPARISON:  None. FINDINGS: Mediastinum and hilar structures normal. Heart size normal. Mild bibasilar subsegmental atelectasis. No pleural effusion or pneumothorax. Heart size normal. No acute bony abnormality. IMPRESSION: Mild bibasilar subsegmental atelectasis. Electronically Signed   By: Maisie Fus  Register   On: 07/16/2015 13:52    DISCHARGE INSTRUCTIONS:   DISCHARGE MEDICATIONS:     Medication List    TAKE these medications        acetaminophen 500 MG tablet  Commonly known as:  TYLENOL  Take 500 mg by mouth daily as needed for headache.     buPROPion 150 MG 12 hr tablet  Commonly known as:  WELLBUTRIN SR  Take 150 mg by mouth 2 (two) times daily.     CALCIUM-D PO  Take 1 tablet by mouth 2 (two) times daily.     diclofenac sodium 1 % Gel  Commonly known as:  VOLTAREN  Apply 1 application topically 4 (four) times daily as needed  (pain).     DULoxetine 30 MG capsule  Commonly known as:  CYMBALTA  Take 30 mg by mouth daily. Take with a 60 mg capsule for a 90 mg dose     DULoxetine 60 MG capsule  Commonly known as:  CYMBALTA  Take 60 mg by mouth daily. Take with a 30 mg capsule for a 90 mg dose     enoxaparin 30 MG/0.3ML injection  Commonly known as:  LOVENOX  Inject 0.3 mLs (30 mg total) into the skin every 12 (twelve) hours.     FentaNYL 37.5 MCG/HR Pt72  Place 37.5 mcg onto the skin every 3 (three) days.     ferrous sulfate 325 (65 FE) MG tablet  Take 325 mg by mouth 2 (two) times daily with a meal.     Fish Oil 1000 MG Caps  Take 1,000 mg by mouth 2 (two) times daily.     GLUCOSAMINE-CHONDROITIN PO  Take 1 capsule by mouth 2 (two) times daily.     loratadine 10 MG tablet  Commonly known as:  CLARITIN  Take 10 mg by mouth daily.     multivitamin with minerals Tabs tablet  Take 1 tablet by mouth daily.     ondansetron 8 MG tablet  Commonly known as:  ZOFRAN  Take 1 tablet (8 mg total) by mouth every 8 (eight) hours as needed for nausea or vomiting.     oxyCODONE-acetaminophen 10-325 MG tablet  Commonly known as:  PERCOCET  Take 1 tablet by mouth every 4 (four) hours as needed for pain.     oxymetazoline 0.05 % nasal spray  Commonly known as:  AFRIN  Place 1 spray into both nostrils 4 (four) times daily as needed (nasal congestion).     VITAMIN B COMPLEX PO  Place under the tongue daily. One dropperful     Vitamin D (Ergocalciferol) 50000 units Caps capsule  Commonly known as:  DRISDOL  Take 50,000 Units by mouth every 7 (seven) days. On Sundays        FOLLOW UP VISIT:       Follow-up Information    Follow up with CAFFREY JR,W D, MD. Schedule an appointment as soon as possible for a visit in 2 weeks.   Specialty:  Orthopedic Surgery   Contact information:  [address] [place] [address] [phone]       DISPOSITION:   Home  CONDITION:   Stable   Margart Sickles, PA-C  07/30/2015 8:33 AM

## 2015-07-30 NOTE — Progress Notes (Signed)
Physical Therapy Treatment Patient Details Name: Mark [name] MRN: [id number] DOB: August 09, 1965 Today's Date: 07/30/2015    History of Present Illness 50 y.o. male s/p left total knee aprthroplasty. Hx of HTN, obesity, sleep apnea, chronic pain, pain in chest, hyperlipidemia, osteoarthritis, bilateral shoulder surgery, knee arthroscopy.    PT Comments    Reviewed HEP and use of bone foam with pt. Pt getting ready to d/c at end of session.  Follow Up Recommendations  Home health PT;Supervision for mobility/OOB     Equipment Recommendations  None recommended by PT    Recommendations for Other Services       Precautions / Restrictions Precautions Precautions: Knee;Fall Required Braces or Orthoses:  (Discontinued - block worn off) Restrictions Weight Bearing Restrictions: Yes LLE Weight Bearing: Weight bearing as tolerated    Mobility  Bed Mobility Overal bed mobility: Needs Assistance Bed Mobility: Supine to Sit;Sit to Supine     Supine to sit: Supervision     General bed mobility comments: supervision for safety; R LE assist with LLE to reach and lower from EOB; increased time needed but no physical asssit  Transfers                    Ambulation/Gait                 Stairs            Wheelchair Mobility    Modified Rankin (Stroke Patients Only)       Balance Overall balance assessment: Needs assistance Sitting-balance support: Feet supported Sitting balance-Leahy Scale: Good                              Cognition Arousal/Alertness: Awake/alert Behavior During Therapy: WFL for tasks assessed/performed Overall Cognitive Status: Within Functional Limits for tasks assessed                      Exercises Total Joint Exercises Quad Sets: AROM;Left;10 reps;Supine Heel Slides: Left;10 reps;Supine;AROM Long Arc Quad: AAROM;Right;Seated;5 reps Knee Flexion: AROM;Left;5 reps;Seated Goniometric ROM: 5-40    General  Comments        Pertinent Vitals/Pain Pain Assessment: Faces Faces Pain Scale: Hurts little more Pain Location: L knee Pain Descriptors / Indicators: Sore Pain Intervention(s): Monitored during session;Premedicated before session;Limited activity within patient's tolerance    Home Living                      Prior Function            PT Goals (current goals can now be found in the care plan section) Acute Rehab PT Goals Patient Stated Goal: go home PT Goal Formulation: With patient Time For Goal Achievement: 08/03/15 Potential to Achieve Goals: Good Progress towards PT goals: Progressing toward goals    Frequency  7X/week    PT Plan Current plan remains appropriate    Co-evaluation             End of Session Equipment Utilized During Treatment: Gait belt Activity Tolerance: Patient tolerated treatment well;Patient limited by pain Patient left: with call bell/phone within reach;with nursing/sitter in room;in bed (sitting EOB getting ready to d/c)     Time: 4098-1191 PT Time Calculation (min) (ACUTE ONLY): 12 min  Charges:  $Therapeutic Exercise: 8-22 mins                    G  Codes:      Derek Mound, PTA Pager: [phone]   07/30/2015, 4:02 PM

## 2015-07-31 ENCOUNTER — Encounter (HOSPITAL_COMMUNITY): Payer: Self-pay | Admitting: Orthopedic Surgery

## 2015-10-02 ENCOUNTER — Ambulatory Visit: Payer: BLUE CROSS/BLUE SHIELD | Admitting: Neurology

## 2015-11-26 ENCOUNTER — Ambulatory Visit (INDEPENDENT_AMBULATORY_CARE_PROVIDER_SITE_OTHER): Payer: BLUE CROSS/BLUE SHIELD | Admitting: Nurse Practitioner

## 2015-11-26 ENCOUNTER — Encounter: Payer: Self-pay | Admitting: Nurse Practitioner

## 2015-11-26 ENCOUNTER — Other Ambulatory Visit: Payer: Self-pay

## 2015-11-26 VITALS — BP 159/93 | HR 96 | Temp 99.2°F | Ht 70.0 in | Wt 296.8 lb

## 2015-11-26 DIAGNOSIS — G8929 Other chronic pain: Secondary | ICD-10-CM

## 2015-11-26 DIAGNOSIS — Z1211 Encounter for screening for malignant neoplasm of colon: Secondary | ICD-10-CM | POA: Insufficient documentation

## 2015-11-26 MED ORDER — PEG 3350-KCL-NA BICARB-NACL 420 G PO SOLR: 4000.0000 mL | ORAL | Status: DC

## 2015-11-26 NOTE — Assessment & Plan Note (Signed)
Tonic joint pain, currently sees pain management clinic. Has had recent knee replacement 3 months ago, right shoulder rotator cuff 3 weeks ago. Also with continued pain in his other knee, and right hip. Is on multiple medications. We will plan for propofol for his colonoscopy to promote adequate sedation. Return for follow-up as needed based on postprocedure recommendations.

## 2015-11-26 NOTE — Patient Instructions (Signed)
1. We will schedule your procedures for you. 2. Return for follow-up based on postprocedure recommendations or as needed for any stomach/colon symptoms.

## 2015-11-26 NOTE — Progress Notes (Signed)
Primary Care Physician:  Dwana MelenaZack Hall, MD Primary Gastroenterologist:  Dr. Jena Gaussourk  Chief Complaint  Patient presents with  . set up TCS    HPI:   Mark BankerJames R Luecke is a 50 y.o. male who presents To schedule screening colonoscopy. Initially attempted phone triage 04/10/2015 but was deferred to office visit because of persistent pain medications related to knee pain. Subsequently has undergone total knee repair placement on 07/27/2015 and it was decided to hold off for colonoscopy until completely recovered.  Today he states his knee surgery went well. Still seeing outpatient rehab, has a follow-up on Friday and is likely near to be discharged postoperatively. Also had right rotator cuff/tendon repair 3 weeks ago. Shoulder still sore but doing well. Not doing therapy with that yet. Is able to lay flat and on his left side. Has never had a colonoscopy before. Denies abdominal pain, N/V, fever, chills, unintentional weight loss, hematochezia, melena, changes in bowel habits. Has had dark stools while on iron. Has regular bowel movements, no constipation/straining on pain medications and iron. Denies chest pain, dyspnea, dizziness, lightheadedness, syncope, near syncope. Denies any other upper or lower GI symptoms.  Sees pain management for pain medications.  Past Medical History  Diagnosis Date  . Obesity, unspecified   . Trigger finger (acquired)   . Fatigue   . Osteoarthritis   . Hyperlipemia   . Knee pain   . Shoulder pain   . Unspecified sleep apnea     uses cpap  . Pneumonia   . Depression   . Anxiety   . GERD (gastroesophageal reflux disease)     heartburn occ - better since having gastric bypass  . Headache     migraines - none for years, sinus headaches at times  . Anemia     low iron  . Astigmatism of left eye     Past Surgical History  Procedure Laterality Date  . Gastric bypass  2011  . Carpal tunnel release Bilateral   . Shoulder surgery Left 2006  . Shoulder surgery  Right 2012  . Knee arthroscopy Right   . Knee arthroscopy Left 11/15/14  . Total knee arthroplasty Left 07/27/2015    Procedure: LEFT TOTAL KNEE ARTHROPLASTY;  Surgeon: Frederico Hammananiel Caffrey, MD;  [facility];  Service: Orthopedics;  Laterality: Left;    Current Outpatient Prescriptions  Medication Sig Dispense Refill  . acetaminophen (TYLENOL) 500 MG tablet Take 500 mg by mouth daily as needed for headache.    . B Complex Vitamins (VITAMIN B COMPLEX PO) Place under the tongue daily. One dropperful    . buPROPion (WELLBUTRIN SR) 150 MG 12 hr tablet Take 150 mg by mouth 2 (two) times daily.    . Calcium Carbonate-Vitamin D (CALCIUM-D PO) Take 1 tablet by mouth 2 (two) times daily.    . diclofenac sodium (VOLTAREN) 1 % GEL Apply 1 application topically 4 (four) times daily as needed (pain).    . DULoxetine (CYMBALTA) 30 MG capsule Take 30 mg by mouth daily. Take with a 60 mg capsule for a 90 mg dose    . DULoxetine (CYMBALTA) 60 MG capsule Take 60 mg by mouth daily. Take with a 30 mg capsule for a 90 mg dose    . FentaNYL 37.5 MCG/HR PT72 Place 37.5 mcg onto the skin every 3 (three) days.    . ferrous sulfate 325 (65 FE) MG tablet Take 325 mg by mouth 2 (two) times daily with a meal.    .  lamoTRIgine (LAMICTAL) 100 MG tablet Take 100 mg by mouth daily.    Marland Kitchen loratadine (CLARITIN) 10 MG tablet Take 10 mg by mouth daily.    . Magnesium 250 MG TABS Take by mouth.    . Multiple Vitamin (MULTIVITAMIN WITH MINERALS) TABS tablet Take 1 tablet by mouth daily.    . Omega-3 Fatty Acids (FISH OIL) 1000 MG CAPS Take 1,000 mg by mouth 2 (two) times daily.    . ondansetron (ZOFRAN) 8 MG tablet Take 1 tablet (8 mg total) by mouth every 8 (eight) hours as needed for nausea or vomiting. 40 tablet 0  . oxyCODONE-acetaminophen (PERCOCET) 10-325 MG tablet Take 1 tablet by mouth every 4 (four) hours as needed for pain.   0  . oxymetazoline (AFRIN) 0.05 % nasal spray Place 1 spray into both nostrils 4 (four) times daily  as needed (nasal congestion).    . Vitamin D, Ergocalciferol, (DRISDOL) 50000 units CAPS capsule Take 50,000 Units by mouth every 7 (seven) days. On Sundays     No current facility-administered medications for this visit.    Allergies as of 11/26/2015 - Review Complete 11/26/2015  Allergen Reaction Noted  . Penicillins Hives 10/11/2013  . Asa [aspirin] Other (See Comments) 10/11/2013  . Ibuprofen Other (See Comments) 10/11/2013  . Morphine and related Nausea And Vomiting and Other (See Comments) 10/11/2013  . Adhesive [tape] Rash 07/13/2015    Family History  Problem Relation Age of Onset  . Heart failure Mother   . Diabetes Mother   . COPD Mother   . Cancer Father   . Arthritis Father   . Colon cancer Neg Hx     Social History   Social History  . Marital Status: Married    Spouse Name: N/A  . Number of Children: 3  . Years of Education: HS   Occupational History  . [employer]    Social History Main Topics  . Smoking status: Never Smoker   . Smokeless tobacco: Former Neurosurgeon    Types: Chew    Quit date: 07/15/1994  . Alcohol Use: 0.0 oz/week    0 Standard drinks or equivalent per week     Comment: One every 1-2 months  . Drug Use: No  . Sexual Activity: Not on file   Other Topics Concern  . Not on file   Social History Narrative   Drinks 10 cups of coffee     Review of Systems: General: Negative for anorexia, weight loss, fever, chills, fatigue, weakness. ENT: Negative for hoarseness, difficulty swallowing. CV: Negative for chest pain, angina, palpitations, peripheral edema.  Respiratory: Negative for dyspnea at rest, cough, sputum, wheezing.  GI: See history of present illness. MS: Admits chronic joint pain.  Derm: Negative for rash or itching.  Endo: Negative for unusual weight change.  Heme: Negative for bruising or bleeding. Allergy: Negative for rash or hives.    Physical Exam: BP 159/93 mmHg  Pulse 96  Temp(Src) 99.2 F (37.3 C)  Ht 5'  10" (1.778 m)  Wt 296 lb 12.8 oz (134.628 kg)  BMI 42.59 kg/m2 General:   Alert and oriented. Pleasant and cooperative. Well-nourished and well-developed.  Head:  Normocephalic and atraumatic. Eyes:  Without icterus, sclera clear and conjunctiva pink.  Ears:  Normal auditory acuity. Cardiovascular:  S1, S2 present without murmurs appreciated. Extremities without clubbing or edema. Respiratory:  Clear to auscultation bilaterally. No wheezes, rales, or rhonchi. No distress.  Gastrointestinal:  +BS, rounded but soft, non-tender and non-distended. No  HSM noted. No guarding or rebound. No masses appreciated.  Rectal:  Deferred  Musculoskalatal:  Symmetrical without gross deformities. Skin:  Intact without significant lesions or rashes. Neurologic:  Alert and oriented x4;  grossly normal neurologically. Psych:  Alert and cooperative. Normal mood and affect. Heme/Lymph/Immune: No excessive bruising noted.    11/26/2015 9:28 AM   Disclaimer: This note was dictated with voice recognition software. Similar sounding words can inadvertently be transcribed and may not be corrected upon review.

## 2015-11-26 NOTE — Assessment & Plan Note (Signed)
Patient due for first-ever screening colonoscopy, was initially scheduled for end of last year but had to postpone due to left knee arthroplasty scheduled. This is been completed and he is recovered quite well. Also had a right rotator cuff tear repair about 3 weeks ago. Continues to see orthopedics. Continues see pain management clinic. Is able to lie flat on his back and on his left side. Generally asymptomatic from a GI standpoint. We will proceed with the screening colonoscopy as recommended.  Proceed with TCS with propofol with Dr. Jena Gaussourk in near future: the risks, benefits, and alternatives have been discussed with the patient in detail. The patient states understanding and desires to proceed.  The patient is currently on Wellbutrin, Cymbalta, fentanyl patch, Percocet. We'll plan for the procedure on propofol to promote adequate sedation.

## 2015-11-26 NOTE — Progress Notes (Signed)
CC'D TO PCP °

## 2015-12-17 ENCOUNTER — Emergency Department (HOSPITAL_COMMUNITY)
Admission: EM | Admit: 2015-12-17 | Discharge: 2015-12-17 | Disposition: A | Discharge status: Home / Self Care | Payer: BLUE CROSS/BLUE SHIELD | Attending: Emergency Medicine | Admitting: Emergency Medicine

## 2015-12-17 ENCOUNTER — Emergency Department (HOSPITAL_COMMUNITY): Payer: BLUE CROSS/BLUE SHIELD

## 2015-12-17 ENCOUNTER — Encounter (HOSPITAL_COMMUNITY): Payer: Self-pay | Admitting: Emergency Medicine

## 2015-12-17 DIAGNOSIS — Y9389 Activity, other specified: Secondary | ICD-10-CM | POA: Diagnosis not present

## 2015-12-17 DIAGNOSIS — Z87891 Personal history of nicotine dependence: Secondary | ICD-10-CM | POA: Diagnosis not present

## 2015-12-17 DIAGNOSIS — W208XXA Other cause of strike by thrown, projected or falling object, initial encounter: Secondary | ICD-10-CM | POA: Diagnosis not present

## 2015-12-17 DIAGNOSIS — Z6841 Body Mass Index (BMI) 40.0 and over, adult: Secondary | ICD-10-CM | POA: Insufficient documentation

## 2015-12-17 DIAGNOSIS — F329 Major depressive disorder, single episode, unspecified: Secondary | ICD-10-CM | POA: Diagnosis not present

## 2015-12-17 DIAGNOSIS — Y929 Unspecified place or not applicable: Secondary | ICD-10-CM | POA: Diagnosis not present

## 2015-12-17 DIAGNOSIS — M79642 Pain in left hand: Secondary | ICD-10-CM

## 2015-12-17 DIAGNOSIS — Z79899 Other long term (current) drug therapy: Secondary | ICD-10-CM | POA: Insufficient documentation

## 2015-12-17 DIAGNOSIS — Y999 Unspecified external cause status: Secondary | ICD-10-CM | POA: Insufficient documentation

## 2015-12-17 DIAGNOSIS — E669 Obesity, unspecified: Secondary | ICD-10-CM | POA: Insufficient documentation

## 2015-12-17 MED ORDER — OXYCODONE HCL 10 MG PO TABS: 10.0000 mg | ORAL_TABLET | Freq: Three times a day (TID) | ORAL | Status: DC | PRN

## 2015-12-17 NOTE — Patient Instructions (Addendum)
Mark BankerJames R [name]  12/17/2015     @PREFPERIOPPHARMACY @   Your procedure is scheduled on  12/24/2015   Report to [name] at  645  A.M.  Call this number if you have problems the morning of surgery:  [phone]   Remember:  Do not eat food or drink liquids after midnight.  Take these medicines the morning of surgery with A SIP OF WATER  Wellbutrin, cymbalta, claritin, percocet.   Do not wear jewelry, make-up or nail polish.  Do not wear lotions, powders, or perfumes.  You may wear deoderant.  Do not shave 48 hours prior to surgery.  Men may shave face and neck.  Do not bring valuables to the hospital.  [facility] is not [facility] for any belongings or valuables.  Contacts, dentures or bridgework may not be worn into surgery.  Leave your suitcase in the car.  After surgery it may be brought to your room.  For patients admitted to the hospital, discharge time will be determined by your treatment team.  Patients discharged the day of surgery will not be allowed to drive home.   Name and phone number of your driver:   none Special instructions:  Follow the diet and prep instructions given to you by Dr Mark [name]'s office.  Please read over the following fact sheets that you were given. Coughing and Deep Breathing, Surgical Site Infection Prevention, Anesthesia Post-op Instructions and Care and Recovery After Surgery      Colonoscopy A colonoscopy is an exam to look at the entire large intestine (colon). This exam can help find problems such as tumors, polyps, inflammation, and areas of bleeding. The exam takes about 1 hour.  LET [facility] PROVIDER KNOW ABOUT:   Any allergies you have.  All medicines you are taking, including vitamins, herbs, eye drops, creams, and over-the-counter medicines.  Previous problems you or members of your family have had with the use of anesthetics.  Any blood disorders you have.  Previous surgeries you have had.  Medical  conditions you have. RISKS AND COMPLICATIONS  Generally, this is a safe procedure. However, as with any procedure, complications can occur. Possible complications include:  Bleeding.  Tearing or rupture of the colon wall.  Reaction to medicines given during the exam.  Infection (rare). BEFORE THE PROCEDURE   Ask your health care provider about changing or stopping your regular medicines.  You may be prescribed an oral bowel prep. This involves drinking a large amount of medicated liquid, starting the day before your procedure. The liquid will cause you to have multiple loose stools until your stool is almost clear or light green. This cleans out your colon in preparation for the procedure.  Do not eat or drink anything else once you have started the bowel prep, unless your health care provider tells you it is safe to do so.  Arrange for someone to drive you home after the procedure. PROCEDURE   You will be given medicine to help you relax (sedative).  You will lie on your side with your knees bent.  A long, flexible tube with a light and camera on the end (colonoscope) will be inserted through the rectum and into the colon. The camera sends video back to a computer screen as it moves through the colon. The colonoscope also releases carbon dioxide gas to inflate the colon. This helps your health care provider see the area better.  During the exam, your health  care provider may take a small tissue sample (biopsy) to be examined under a microscope if any abnormalities are found.  The exam is finished when the entire colon has been viewed. AFTER THE PROCEDURE   Do not drive for 24 hours after the exam.  You may have a small amount of blood in your stool.  You may pass moderate amounts of gas and have mild abdominal cramping or bloating. This is caused by the gas used to inflate your colon during the exam.  Ask when your test results will be ready and how you will get your results.  Make sure you get your test results.   This information is not intended to replace advice given to you by your health care provider. Make sure you discuss any questions you have with your health care provider.   Document Released: 06/06/2000 Document Revised: 03/30/2013 Document Reviewed: 02/14/2013 Elsevier Interactive Patient Education 2016 [employer] Colonoscopy, Care After Refer to this sheet in the next few weeks. These instructions provide you with information on caring for yourself after your procedure. Your health care provider may also give you more specific instructions. Your treatment has been planned according to current medical practices, but problems sometimes occur. Call your health care provider if you have any problems or questions after your procedure. WHAT TO EXPECT AFTER THE PROCEDURE  After your procedure, it is typical to have the following:  A small amount of blood in your stool.  Moderate amounts of gas and mild abdominal cramping or bloating. HOME CARE INSTRUCTIONS  Do not drive, operate machinery, or sign important documents for 24 hours.  You may shower and resume your regular physical activities, but move at a slower pace for the first 24 hours.  Take frequent rest periods for the first 24 hours.  Walk around or put a warm pack on your abdomen to help reduce abdominal cramping and bloating.  Drink enough fluids to keep your urine clear or pale yellow.  You may resume your normal diet as instructed by your health care provider. Avoid heavy or fried foods that are hard to digest.  Avoid drinking alcohol for 24 hours or as instructed by your health care provider.  Only take over-the-counter or prescription medicines as directed by your health care provider.  If a tissue sample (biopsy) was taken during your procedure:  Do not take aspirin or blood thinners for 7 days, or as instructed by your health care provider.  Do not drink alcohol for 7 days, or  as instructed by your health care provider.  Eat soft foods for the first 24 hours. SEEK MEDICAL CARE IF: You have persistent spotting of blood in your stool 2-3 days after the procedure. SEEK IMMEDIATE MEDICAL CARE IF:  You have more than a small spotting of blood in your stool.  You pass large blood clots in your stool.  Your abdomen is swollen (distended).  You have nausea or vomiting.  You have a fever.  You have increasing abdominal pain that is not relieved with medicine.   This information is not intended to replace advice given to you by your health care provider. Make sure you discuss any questions you have with your health care provider.   Document Released: 01/22/2004 Document Revised: 03/30/2013 Document Reviewed: 02/14/2013 Elsevier Interactive Patient Education 2016 [employer] PATIENT INSTRUCTIONS POST-ANESTHESIA  IMMEDIATELY FOLLOWING SURGERY:  Do not drive or operate machinery for the first twenty four hours after surgery.  Do not make any important decisions  for twenty four hours after surgery or while taking narcotic pain medications or sedatives.  If you develop intractable nausea and vomiting or a severe headache please notify your doctor immediately.  FOLLOW-UP:  Please make an appointment with your surgeon as instructed. You do not need to follow up with anesthesia unless specifically instructed to do so.  WOUND CARE INSTRUCTIONS (if applicable):  Keep a dry clean dressing on the anesthesia/puncture wound site if there is drainage.  Once the wound has quit draining you may leave it open to air.  Generally you should leave the bandage intact for twenty four hours unless there is drainage.  If the epidural site drains for more than 36-48 hours please call the anesthesia department.  QUESTIONS?:  Please feel free to call your physician or the hospital operator if you have any questions, and they will be happy to assist you.

## 2015-12-17 NOTE — ED Notes (Signed)
Patient states the hood of his truck fell on his left hand this morning. Complaining of pain to left hand since injury.

## 2015-12-17 NOTE — ED Provider Notes (Signed)
CSN: [id number]     Arrival date & time 12/17/15  [id number] History  By signing my name below, I, Mark [name], attest that this documentation has been prepared under the direction and in the presence of Mark MemosJason Cloie Wooden, MD. Electronically Signed: Phillis HaggisGabriella [name], ED Scribe. 12/17/2015. 9:34 AM.     Chief Complaint  Patient presents with  . Hand Injury   The history is provided by the patient. No language interpreter was used.  HPI Comments: Mark BankerJames R [name] is a 50 y.o. male with a hx of osteoarthritis who presents to the Emergency Department complaining of a left hand injury onset PTA. Pt states that he was working on his truck when the hood slammed down onto his hand. He reports that the worst pain is across the dorsum of the hand. He states that he has worsening pain with making a fist. He took pain medication PTA to mild relief. He denies other injuries, numbness, or weakness. Pt takes Percocet daily and uses a fentanyl patch daily.   Past Medical History  Diagnosis Date  . Obesity, unspecified   . Trigger finger (acquired)   . Fatigue   . Osteoarthritis   . Hyperlipemia   . Knee pain   . Shoulder pain   . Unspecified sleep apnea     uses cpap  . Pneumonia   . Depression   . Anxiety   . GERD (gastroesophageal reflux disease)     heartburn occ - better since having gastric bypass  . Headache     migraines - none for years, sinus headaches at times  . Anemia     low iron  . Astigmatism of left eye    Past Surgical History  Procedure Laterality Date  . Gastric bypass  2011  . Carpal tunnel release Bilateral   . Shoulder surgery Left 2006  . Shoulder surgery Right 2012  . Knee arthroscopy Right   . Knee arthroscopy Left 11/15/14  . Total knee arthroplasty Left 07/27/2015    Procedure: LEFT TOTAL KNEE ARTHROPLASTY;  Surgeon: Frederico Hammananiel Caffrey, MD;  [facility];  Service: Orthopedics;  Laterality: Left;   Family History  Problem Relation Age of Onset  . Heart failure Mother   .  Diabetes Mother   . COPD Mother   . Cancer Father   . Arthritis Father   . Colon cancer Neg Hx    Social History  Substance Use Topics  . Smoking status: Never Smoker   . Smokeless tobacco: Former NeurosurgeonUser    Types: Chew    Quit date: 07/15/1994  . Alcohol Use: 0.0 oz/week    0 Standard drinks or equivalent per week     Comment: One every 1-2 months    Review of Systems  Musculoskeletal: Positive for arthralgias.  Neurological: Negative for weakness and numbness.  All other systems reviewed and are negative.  Allergies  Penicillins; Asa; Ibuprofen; Morphine and related; and Adhesive  Home Medications   Prior to Admission medications   Medication Sig Start Date End Date Taking? Authorizing Provider  acetaminophen (TYLENOL) 500 MG tablet Take 500 mg by mouth daily as needed for headache.   Yes Historical Provider, MD  B Complex Vitamins (VITAMIN B COMPLEX PO) Place under the tongue daily. One dropperful   Yes Historical Provider, MD  buPROPion (WELLBUTRIN SR) 150 MG 12 hr tablet Take 150 mg by mouth 2 (two) times daily.   Yes Historical Provider, MD  diclofenac sodium (VOLTAREN) 1 % GEL Apply 1 application topically  4 (four) times daily as needed (pain).   Yes Historical Provider, MD  DULoxetine (CYMBALTA) 30 MG capsule Take 30 mg by mouth daily. Take with a 60 mg capsule for a 90 mg dose   Yes Historical Provider, MD  DULoxetine (CYMBALTA) 60 MG capsule Take 60 mg by mouth daily. Take with a 30 mg capsule for a 90 mg dose   Yes Historical Provider, MD  fentaNYL (DURAGESIC - DOSED MCG/HR) 50 MCG/HR Place 50 mcg onto the skin every 3 (three) days.   Yes Historical Provider, MD  ferrous sulfate 325 (65 FE) MG tablet Take 325 mg by mouth 2 (two) times daily with a meal.   Yes Historical Provider, MD  Iron-FA-B Cmp-C-Biot-Probiotic (FUSION PLUS PO) Take 1 tablet by mouth daily.   Yes Historical Provider, MD  lamoTRIgine (LAMICTAL) 100 MG tablet Take 100 mg by mouth daily.   Yes Historical  Provider, MD  levocetirizine (XYZAL) 5 MG tablet Take 5 mg by mouth every evening.   Yes Historical Provider, MD  Magnesium 250 MG TABS Take 1 tablet by mouth daily.    Yes Historical Provider, MD  oxyCODONE-acetaminophen (PERCOCET) 10-325 MG tablet Take 1 tablet by mouth every 4 (four) hours as needed for pain.   Yes Historical Provider, MD  oxymetazoline (AFRIN) 0.05 % nasal spray Place 1 spray into both nostrils 4 (four) times daily as needed (nasal congestion).   Yes Historical Provider, MD  Vitamin D, Ergocalciferol, (DRISDOL) 50000 units CAPS capsule Take 50,000 Units by mouth every 7 (seven) days. On Sundays   Yes Historical Provider, MD  Oxycodone HCl 10 MG TABS Take 1 tablet (10 mg total) by mouth every 8 (eight) hours as needed. 12/17/15   Mark Memos, MD  polyethylene glycol-electrolytes (TRILYTE) 420 g solution Take 4,000 mLs by mouth as directed. 11/26/15   Corbin Ade, MD   BP 142/97 mmHg  Pulse 83  Temp(Src) 98.2 F (36.8 C) (Oral)  Resp 21  Ht  (1.778 m)  Wt 295 lb (133.811 kg)  BMI 42.33 kg/m2  SpO2 97% Physical Exam  Constitutional: He is oriented to person, place, and time. He appears well-developed and well-nourished.  HENT:  Head: Normocephalic and atraumatic.  Eyes: Conjunctivae and EOM are normal. Pupils are equal, round, and reactive to light.  Neck: Normal range of motion. Neck supple.  Musculoskeletal: He exhibits tenderness.  Good neurovascular function of all 5 digits, normal radial pulse and cap refill; tenderness with lateral compression of metacarpals; indentation across all 5 metacarpals  Neurological: He is alert and oriented to person, place, and time.  Skin: Skin is warm and dry.  Psychiatric: He has a normal mood and affect. His behavior is normal.  Nursing note and vitals reviewed.   ED Course  Procedures (including critical care time) DIAGNOSTIC STUDIES: Oxygen Saturation is 97% on RA, normal by my interpretation.    COORDINATION OF  CARE: 9:33 AM-Discussed treatment plan which includes x-ray with pt at bedside and pt agreed to plan.   Labs Review Labs Reviewed - No data to display  Imaging Review Dg Hand Complete Left  12/17/2015  CLINICAL DATA:  Reino Kent of truck fell on hand EXAM: LEFT HAND - COMPLETE 3+ VIEW COMPARISON:  None. FINDINGS: Frontal, oblique, and lateral views were obtained. There is no acute fracture or frank dislocation. There is extensive osteoarthritic change in the second and third metacarpals with mild subluxation at the second and third MCP joints. There is generalized soft tissue swelling  of the second digit as well as less pronounced soft tissue swelling of the proximal third and fourth digits. A focal erosion is noted in the lateral distal aspect of the third proximal phalanx. A small erosion is also evident in the medial proximal most aspect of the second proximal phalanx. IMPRESSION: No acute fracture or frank dislocation. Multiple areas of arthropathy with mild subluxation at the second and third MCP joints. There are scattered erosions. There is soft tissue swelling at multiple sites, most marked involving the second digit diffusely. While this soft tissue swelling could have traumatic etiology, underlying arthropathy as the cause of this swelling must be of concern. The appearance of the second digit soft tissue swelling is concerning for possible psoriatic arthritis, although periostitis is not evident on this examination. Electronically Signed   By: Bretta BangWilliam  Woodruff III M.D.   On: 12/17/2015 09:56   I have personally reviewed and evaluated these images as part of my medical decision-making.   EKG Interpretation None      MDM   Final diagnoses:  Pain of left hand    Patient has left hand pain after that slammed on it earlier today. Able to make a fist without any obvious aeration in alignment. Neurovascularly intact. X-rays negative for any fracture. Secondary to continued pain of metacarpals I  will put in a splint and follow his doctor in a week to ensure that symptoms are improving and possibly for need for repeat x-ray to evaluate for occult fractures.  New Prescriptions: Discharge Medication List as of 12/17/2015 11:16 AM    START taking these medications   Details  Oxycodone HCl 10 MG TABS Take 1 tablet (10 mg total) by mouth every 8 (eight) hours as needed., Starting 12/17/2015, Until Discontinued, Print         I have personally and contemperaneously reviewed labs and imaging and used in my decision making as above.   A medical screening exam was performed and I feel the patient has had an appropriate workup for their chief complaint at this time and likelihood of emergent condition existing is low and thus workup can continue on an outpatient basis.. Their vital signs are stable. They have been counseled on decision, discharge, follow up and which symptoms necessitate immediate return to the emergency department.  They verbally stated understanding and agreement with plan and discharged in stable condition.    I personally performed the services described in this documentation, which was scribed in my presence. The recorded information has been reviewed and is accurate.    Mark MemosJason Hadlee Burback, MD 12/17/15 [phone]

## 2015-12-19 ENCOUNTER — Encounter (HOSPITAL_COMMUNITY): Payer: Self-pay

## 2015-12-19 ENCOUNTER — Encounter (HOSPITAL_COMMUNITY)
Admission: RE | Admit: 2015-12-19 | Discharge: 2015-12-19 | Disposition: A | Discharge status: Home / Self Care | Payer: BLUE CROSS/BLUE SHIELD | Source: Ambulatory Visit | Attending: Internal Medicine | Admitting: Internal Medicine

## 2015-12-19 DIAGNOSIS — Z01812 Encounter for preprocedural laboratory examination: Secondary | ICD-10-CM | POA: Insufficient documentation

## 2015-12-19 LAB — CBC WITH DIFFERENTIAL/PLATELET
Basophils Absolute: 0.1 10*3/uL (ref 0.0–0.1)
Basophils Relative: 1 %
Eosinophils Absolute: 0.4 10*3/uL (ref 0.0–0.7)
Eosinophils Relative: 6 %
HEMATOCRIT: 36.2 % — AB (ref 39.0–52.0)
Hemoglobin: 11.8 g/dL — ABNORMAL LOW (ref 13.0–17.0)
LYMPHS PCT: 24 %
Lymphs Abs: 1.3 10*3/uL (ref 0.7–4.0)
MCH: 29.6 pg (ref 26.0–34.0)
MCHC: 32.6 g/dL (ref 30.0–36.0)
MCV: 90.7 fL (ref 78.0–100.0)
MONO ABS: 0.6 10*3/uL (ref 0.1–1.0)
MONOS PCT: 11 %
NEUTROS ABS: 3.2 10*3/uL (ref 1.7–7.7)
Neutrophils Relative %: 58 %
Platelets: 307 10*3/uL (ref 150–400)
RBC: 3.99 MIL/uL — ABNORMAL LOW (ref 4.22–5.81)
RDW: 12.9 % (ref 11.5–15.5)
WBC: 5.6 10*3/uL (ref 4.0–10.5)

## 2015-12-19 LAB — BASIC METABOLIC PANEL
ANION GAP: 7 (ref 5–15)
BUN: 14 mg/dL (ref 6–20)
CALCIUM: 8.7 mg/dL — AB (ref 8.9–10.3)
CO2: 27 mmol/L (ref 22–32)
CREATININE: 0.91 mg/dL (ref 0.61–1.24)
Chloride: 102 mmol/L (ref 101–111)
GFR calc Af Amer: >60 mL/min (ref >60)
GFR calc non Af Amer: >60 mL/min (ref >60)
GLUCOSE: 90 mg/dL (ref 65–99)
Potassium: 4.1 mmol/L (ref 3.5–5.1)
Sodium: 136 mmol/L (ref 135–145)

## 2015-12-19 NOTE — Pre-Procedure Instructions (Signed)
Patient given information to sign up for my chart at home. 

## 2015-12-24 ENCOUNTER — Encounter (HOSPITAL_COMMUNITY): Admission: RE | Payer: Self-pay | Source: Ambulatory Visit

## 2015-12-24 ENCOUNTER — Ambulatory Visit (HOSPITAL_COMMUNITY)
Admission: RE | Admit: 2015-12-24 | Payer: BLUE CROSS/BLUE SHIELD | Source: Ambulatory Visit | Admitting: Internal Medicine

## 2015-12-24 SURGERY — COLONOSCOPY WITH PROPOFOL
Anesthesia: Monitor Anesthesia Care

## 2015-12-24 MED ORDER — SODIUM CHLORIDE 0.9 % IV SOLN
INTRAVENOUS | Status: DC
Start: 2015-12-24 — End: 2015-12-24

## 2015-12-24 NOTE — OR Nursing (Signed)
Patient called and cancelled procedure stated there is a death in family, will reschedule

## 2016-02-26 ENCOUNTER — Ambulatory Visit: Payer: Self-pay | Admitting: Nurse Practitioner

## 2016-03-17 ENCOUNTER — Ambulatory Visit: Payer: Self-pay | Admitting: Nurse Practitioner

## 2016-04-09 ENCOUNTER — Ambulatory Visit: Payer: Self-pay | Admitting: Nurse Practitioner

## 2016-06-09 DIAGNOSIS — M1611 Unilateral primary osteoarthritis, right hip: Secondary | ICD-10-CM | POA: Diagnosis present

## 2016-06-09 DIAGNOSIS — Z9884 Bariatric surgery status: Secondary | ICD-10-CM

## 2016-06-09 NOTE — H&P (Signed)
PREOPERATIVE H&P Patient ID: Mark [name] MRN: [id number] DOB/AGE: 1965-11-23 50 y.o.  Chief Complaint: OA RIGHT HIP  Planned Procedure Date: 07/08/16 Medical and Cardiac Clearance by Dr. Catalina Pizza pending   HPI: Mark [name] is a 50 y.o. male with as history of gastric bypass, HLD, HTN, Chronic pain / back pain, and OSA on CPAP who presents for evaluation of OA RIGHT HIP. The patient has a history of pain and functional disability in the right hip due to arthritis and has failed non-surgical conservative treatments for greater than 12 weeks.  Onset of symptoms was gradual, starting 2 years ago with gradually worsening course since that time.  He also has known lumbar symptoms and arthritis.  He has had radicular symptoms down his left leg for quite awhile.  History of gastric bypass in 2011 by Dr. Barnetta Chapel at [facility].  He did weigh 425 pounds.  Today he weighs 295.8 with a BMI of 41.3.  He has been steadily loosing weight and plans to lose at least 8 pounds before surgery, which would result in a BMI <40.  He is also in pain management with Dr. Roselie Awkward at [facility]-takes Gabapentin, OxyContin 40 mg b.i.d. and Oxycodone 30 mg q 4 hours.  He denies diabetes, hypertension or a history of MI or CVA.  No history of severe infection.  He is a nonsmoker.  He has had intraarticular injections of the right hip by Dr. Maurice Small which helped for a short amount of time.  Patient currently rates pain at 10 out of 10 with activity. Patient has night pain, worsening of pain with activity and weight bearing, pain that interferes with activities of daily living and pain with passive range of motion.  Patient has evidence of subchondral sclerosis, periarticular osteophytes and joint space narrowing by imaging studies.  There is no active infection.  Past Medical History:  Diagnosis Date  . Anemia    low iron  . Anxiety   . Astigmatism of left eye   . Depression   . Fatigue   . GERD  (gastroesophageal reflux disease)    heartburn occ - better since having gastric bypass  . Headache    migraines - none for years, sinus headaches at times  . Hyperlipemia   . Knee pain   . Obesity, unspecified   . Osteoarthritis   . Pneumonia   . Shoulder pain   . Trigger finger (acquired)   . Unspecified sleep apnea    uses cpap   Past Surgical History:  Procedure Laterality Date  . CARPAL TUNNEL RELEASE Bilateral   . GASTRIC BYPASS  2011  . KNEE ARTHROSCOPY Right   . KNEE ARTHROSCOPY Left 11/15/14  . SHOULDER SURGERY Left 2006  . SHOULDER SURGERY Right 2012 and 2017  . TOTAL KNEE ARTHROPLASTY Left 07/27/2015   Procedure: LEFT TOTAL KNEE ARTHROPLASTY;  Surgeon: Frederico Hamman, MD;  Location: [facility] OR;  Service: Orthopedics;  Laterality: Left;   Allergies  Allergen Reactions  . Penicillins Hives    Has patient had a PCN reaction causing immediate rash, facial/tongue/throat swelling, SOB or lightheadedness with hypotension: Yes Has patient had a PCN reaction causing severe rash involving mucus membranes or skin necrosis: No Has patient had a PCN reaction that required hospitalization unknown "probably not" Has patient had a PCN reaction occurring within the last 10 years: No If all of the above answers are "NO", then may proceed with Cephalosporin use.  Jonne Ply [Aspirin] Other (See Comments)  gastric bypass   . Ibuprofen Other (See Comments)    gastric bypass  . Morphine And Related Nausea And Vomiting and Other (See Comments)    headaches  . Adhesive [Tape] Rash    Please use paper tape, can use cloth tape  States plastic bandaids irritate   Prior to Admission medications   Medication Sig Start Date End Date Taking? Authorizing Provider  acetaminophen (TYLENOL) 500 MG tablet Take 500 mg by mouth daily as needed for headache.    Historical Provider, MD  B Complex Vitamins (VITAMIN B COMPLEX PO) Place under the tongue daily. One dropperful    Historical Provider, MD   buPROPion (WELLBUTRIN SR) 150 MG 12 hr tablet Take 150 mg by mouth 2 (two) times daily.    Historical Provider, MD  diclofenac sodium (VOLTAREN) 1 % GEL Apply 1 application topically 4 (four) times daily as needed (pain).    Historical Provider, MD  DULoxetine (CYMBALTA) 30 MG capsule Take 30 mg by mouth daily. Take with a 60 mg capsule for a 90 mg dose    Historical Provider, MD  DULoxetine (CYMBALTA) 60 MG capsule Take 60 mg by mouth daily. Take with a 30 mg capsule for a 90 mg dose    Historical Provider, MD  fentaNYL (DURAGESIC - DOSED MCG/HR) 50 MCG/HR Place 50 mcg onto the skin every 3 (three) days.    Historical Provider, MD  ferrous sulfate 325 (65 FE) MG tablet Take 325 mg by mouth 2 (two) times daily with a meal.    Historical Provider, MD  Iron-FA-B Cmp-C-Biot-Probiotic (FUSION PLUS PO) Take 1 tablet by mouth daily.    Historical Provider, MD  lamoTRIgine (LAMICTAL) 100 MG tablet Take 100 mg by mouth daily.    Historical Provider, MD  levocetirizine (XYZAL) 5 MG tablet Take 5 mg by mouth every evening.    Historical Provider, MD  Magnesium 250 MG TABS Take 1 tablet by mouth daily.     Historical Provider, MD  Oxycodone HCl 10 MG TABS Take 1 tablet (10 mg total) by mouth every 8 (eight) hours as needed. 12/17/15   Marily MemosJason Mesner, MD  oxyCODONE-acetaminophen (PERCOCET) 10-325 MG tablet Take 1 tablet by mouth every 4 (four) hours as needed for pain.    Historical Provider, MD  oxymetazoline (AFRIN) 0.05 % nasal spray Place 1 spray into both nostrils 4 (four) times daily as needed (nasal congestion).    Historical Provider, MD  polyethylene glycol-electrolytes (TRILYTE) 420 g solution Take 4,000 mLs by mouth as directed. 11/26/15   Corbin Adeobert M Rourk, MD  Vitamin D, Ergocalciferol, (DRISDOL) 50000 units CAPS capsule Take 50,000 Units by mouth every 7 (seven) days. On Sundays    Historical Provider, MD   Social History   Social History  . Marital status: Married    Spouse name: N/A  . Number of  children: 3  . Years of education: HS   Occupational History  . [employer]    Social History Main Topics  . Smoking status: Never Smoker  . Smokeless tobacco: Former NeurosurgeonUser    Types: Chew    Quit date: 07/15/1994  . Alcohol use 0.0 oz/week     Comment: One every 1-2 months  . Drug use: No  . Sexual activity: Yes    Birth control/ protection: None   Other Topics Concern  . Not on file   Social History Narrative   Drinks 10 cups of coffee    Family History  Problem Relation Age of  Onset  . Heart failure Mother   . Diabetes Mother   . COPD Mother   . Cancer Father   . Arthritis Father   . Colon cancer Neg Hx     ROS: Currently denies lightheadedness, dizziness, Fever, chills, CP, SOB.   No personal history of DVT, PE, MI, or CVA. No loose teeth or dentures.   All other systems have been reviewed and were otherwise currently negative with the exception of those mentioned in the HPI and as above.  Objective: Vitals: Ht: 5'11" Wt: 295.8 Temp: 97.8 BP: 161/92 Pulse: 66 O2 98% on room air. Physical Exam: General: Alert, NAD. Trendelenberg Gait - utilizes a cane. HEENT: EOMI, Good Neck Extension  Pulm: No increased work of breathing.  Clear B/L A/P w/o crackle or wheeze.  CV: RRR, No m/g/r appreciated  GI: protuberant, soft, NT, ND Neuro: Neuro without gross focal deficit.  Sensation intact distally Skin: No lesions in the area of chief complaint MSK/Surgical Site: Right Hip mildly tender over greater trochanter.  Externally rotated at rest.  Pain with passive ROM, especially internal rotation.  Positive Stinchfield.  5/5 strength.  NVI.  Sensation intact distally.  Imaging Review Plain radiographs demonstrate severe degenerative joint disease of the right hip.   Assessment: OA RIGHT HIP Principal Problem:   Primary osteoarthritis of right hip Active Problems:   Hypertension   Hyperlipemia   Chronic pain   Sleep apnea   History of gastric bypass   Plan: Plan  for Procedure(s): TOTAL HIP ARTHROPLASTY ANTERIOR APPROACH  The patient history, physical exam, clinical judgement of the provider and imaging are consistent with end stage degenerative joint disease and total joint arthroplasty is deemed medically necessary. The treatment options including medical management, injection therapy, and arthroplasty were discussed at length. The risks and benefits of Procedure(s): TOTAL HIP ARTHROPLASTY ANTERIOR APPROACH were presented and reviewed.  The risks of nonoperative treatment, versus surgical intervention including but not limited to continued pain, aseptic loosening, stiffness, dislocation/subluxation, infection, bleeding, nerve injury, blood clots, cardiopulmonary complications, morbidity, mortality, among others were discussed. The patient verbalizes understanding and wishes to proceed with the plan.  Patient is being admitted for inpatient treatment for surgery, pain control, PT, OT, prophylactic antibiotics, VTE prophylaxis, progressive ambulation, ADL's and discharge planning.   Dental prophylaxis discussed and recommended for 2 years postoperatively.  The patient does meet the criteria for TXA which will be used perioperatively via IV.   Xarelto  will be used postoperatively for DVT prophylaxis in addition to SCDs, and early ambulation.  Avoiding NSAIDs dt h/o gastric bypass. The patient is planning to be discharged home with home health services in care of his wife.  Lucretia KernHenry Calvin Martensen III,PA-C 06/09/2016 6:51 PM

## 2016-06-26 NOTE — Pre-Procedure Instructions (Signed)
    Jennette BankerJames R Pelzel  06/26/2016      [facility] - [place], [place] - [address] [place] [address] Phone: [phone] Fax: [phone]  [facility] [place] - [id number] [place] [address] Phone: [phone] Fax: [phone]    Your procedure is scheduled on  Tuesday, January 16th   [facility] Ad[facility] at 5:30 AM             (posted surgery time 7:30 am - 10:02 am)   Call this number if you have problems the MORNING of surgery:  [phone]   Remember:  Do not eat food or drink liquids after midnight Monday.   Take these medicines the morning of surgery with A SIP OF WATER : Wellbutrin, Cymbalta, Oxycodone, Flomax.  May use inhalers and nasal spray that morning.              4-5 days prior to surgery, STOP taking any Vitamins, Herbal Supplements, Anti-inflammatories.  Do not wear jewelry - no rings or watches.  Do not wear lotions, colognes, or deoderant.              Men may shave face and neck.  Do not bring valuables to the hospital.  [facility] is not [facility] for any belongings or valuables.  Contacts, dentures or bridgework may not be worn into surgery.  Leave your suitcase in the car.  After surgery it may be brought to your room.  For patients admitted to the hospital, discharge time will be determined by your treatment team.  Please read over the following fact sheets that you were given. Pain Booklet, MRSA Information and Surgical Site Infection Prevention

## 2016-06-27 ENCOUNTER — Encounter (HOSPITAL_COMMUNITY): Payer: Self-pay

## 2016-06-27 ENCOUNTER — Encounter (HOSPITAL_COMMUNITY)
Admission: RE | Admit: 2016-06-27 | Discharge: 2016-06-27 | Disposition: A | Discharge status: Home / Self Care | Payer: BLUE CROSS/BLUE SHIELD | Source: Ambulatory Visit | Attending: Orthopedic Surgery | Admitting: Orthopedic Surgery

## 2016-06-27 DIAGNOSIS — Z01812 Encounter for preprocedural laboratory examination: Secondary | ICD-10-CM | POA: Diagnosis not present

## 2016-06-27 LAB — PROTIME-INR
INR: 1.02
Prothrombin Time: 13.4 seconds (ref 11.4–15.2)

## 2016-06-27 LAB — BASIC METABOLIC PANEL
Anion gap: 8 (ref 5–15)
BUN: 6 mg/dL (ref 6–20)
CHLORIDE: 105 mmol/L (ref 101–111)
CO2: 26 mmol/L (ref 22–32)
Calcium: 8.8 mg/dL — ABNORMAL LOW (ref 8.9–10.3)
Creatinine, Ser: 0.95 mg/dL (ref 0.61–1.24)
GFR calc non Af Amer: >60 mL/min (ref >60)
Glucose, Bld: 94 mg/dL (ref 65–99)
Potassium: 4.2 mmol/L (ref 3.5–5.1)
SODIUM: 139 mmol/L (ref 135–145)

## 2016-06-27 LAB — TYPE AND SCREEN
ABO/RH(D): O NEG
ANTIBODY SCREEN: NEGATIVE

## 2016-06-27 LAB — CBC
HEMATOCRIT: 35.9 % — AB (ref 39.0–52.0)
Hemoglobin: 12.1 g/dL — ABNORMAL LOW (ref 13.0–17.0)
MCH: 30.6 pg (ref 26.0–34.0)
MCHC: 33.7 g/dL (ref 30.0–36.0)
MCV: 90.7 fL (ref 78.0–100.0)
PLATELETS: 282 10*3/uL (ref 150–400)
RBC: 3.96 MIL/uL — AB (ref 4.22–5.81)
RDW: 12.2 % (ref 11.5–15.5)
WBC: 7.3 10*3/uL (ref 4.0–10.5)

## 2016-06-27 LAB — APTT: aPTT: 39 seconds — ABNORMAL HIGH (ref 24–36)

## 2016-06-27 LAB — SURGICAL PCR SCREEN
MRSA, PCR: NEGATIVE
Staphylococcus aureus: NEGATIVE

## 2016-06-27 NOTE — Progress Notes (Signed)
PCP is Dr. Hughie ClossZ. Hall in [place]   LOV 04/2016 Denies any cardiac issues or tests.  Has had gastric by pass in the past.  Blood sugar up then, but since surgery, has been doing well.  Last HGB A1C was 4.1 (done 6 mths ago) Repeating today. Has had 2 sleep studies done, one in 2008 and another in 2014.  Positive results.  Does wear a CPAP mask.  Does not know the settings.  Informed patient to bring mask.

## 2016-06-28 LAB — HEMOGLOBIN A1C
HEMOGLOBIN A1C: 5.4 % (ref 4.8–5.6)
MEAN PLASMA GLUCOSE: 108 mg/dL

## 2016-07-07 MED ORDER — TRANEXAMIC ACID 1000 MG/10ML IV SOLN
1000.0000 mg | INTRAVENOUS | Status: DC
  Filled 2016-07-07: qty 10

## 2016-07-08 ENCOUNTER — Inpatient Hospital Stay (HOSPITAL_COMMUNITY)
Admission: RE | Admit: 2016-07-08 | Payer: BLUE CROSS/BLUE SHIELD | Source: Ambulatory Visit | Admitting: Orthopedic Surgery

## 2016-07-08 ENCOUNTER — Encounter (HOSPITAL_COMMUNITY): Admission: RE | Payer: Self-pay | Source: Ambulatory Visit

## 2016-07-08 SURGERY — ARTHROPLASTY, HIP, TOTAL, ANTERIOR APPROACH
Anesthesia: Spinal | Laterality: Right

## 2016-07-21 NOTE — H&P (Signed)
PREOPERATIVE H&P Patient ID: Mark [name] MRN: [id number] DOB/AGE: 1965-08-02 51 y.o.  Chief Complaint: OA RIGHT HIP  Planned Procedure Date: 08/05/16 Medical and Cardiac Clearance by Dr. Catalina Pizza pending   HPI: Mark [name] is a 51 y.o. male with as history of gastric bypass, HLD, HTN, Chronic pain / back pain, and OSA on CPAP who presents for evaluation of OA RIGHT HIP. Sx was planned for last month - cancelled dt illness.  URI / Bronchitis.  This has improved  The patient has a history of pain and functional disability in the right hip due to arthritis and has failed non-surgical conservative treatments for greater than 12 weeks.  Onset of symptoms was gradual, starting 2 years ago with gradually worsening course since that time.  He also has known lumbar symptoms and arthritis.  He has had radicular symptoms down his left leg for quite awhile.  History of gastric bypass in 2011 by Dr. Barnetta Chapel at [facility].  He did weigh 425 pounds.  Today he weighs 295 with a BMI of 41.  He has been steadily loosing weight and plans to lose at least 5 pounds before surgery, which would result in a BMI <40.  He is also in pain management with Dr. Roselie Awkward at [facility]-takes Gabapentin, OxyContin 40 mg b.i.d. and Oxycodone 30 mg q 4 hours.  He denies diabetes, hypertension or a history of MI or CVA.  No history of severe infection.  He is a nonsmoker.  He has had intraarticular injections of the right hip by Dr. Maurice Small which helped for a short amount of time.  Patient currently rates pain at 10 out of 10 with activity. Patient has night pain, worsening of pain with activity and weight bearing, pain that interferes with activities of daily living and pain with passive range of motion.  Patient has evidence of subchondral sclerosis, periarticular osteophytes and joint space narrowing by imaging studies.  There is no active infection.  Past Medical History:  Diagnosis Date  . Anemia    low  iron  . Anxiety   . Astigmatism of left eye   . Depression   . Fatigue   . GERD (gastroesophageal reflux disease)    heartburn occ - better since having gastric bypass  . Headache    migraines - none for years, sinus headaches at times  . Hyperlipemia   . Knee pain   . Obesity, unspecified   . Osteoarthritis   . Pneumonia   . Shoulder pain   . Trigger finger (acquired)   . Unspecified sleep apnea    uses cpap   Past Surgical History:  Procedure Laterality Date  . CARPAL TUNNEL RELEASE Bilateral   . FRACTURE SURGERY     all fingers expect l thumb  . GASTRIC BYPASS  2011  . JOINT REPLACEMENT    . KNEE ARTHROSCOPY Right   . KNEE ARTHROSCOPY Left 11/15/14  . SHOULDER SURGERY Left 2006  . SHOULDER SURGERY Right 2012 and 2017  . TOTAL KNEE ARTHROPLASTY Left 07/27/2015   Procedure: LEFT TOTAL KNEE ARTHROPLASTY;  Surgeon: Frederico Hamman, MD;  Location: [facility] OR;  Service: Orthopedics;  Laterality: Left;   Allergies  Allergen Reactions  . Penicillins Hives    Has patient had a PCN reaction causing immediate rash, facial/tongue/throat swelling, SOB or lightheadedness with hypotension: Yes Has patient had a PCN reaction causing severe rash involving mucus membranes or skin necrosis: No Has patient had a PCN reaction that required hospitalization  unknown "probably not" Has patient had a PCN reaction occurring within the last 10 years: No If all of the above answers are "NO", then may proceed with Cephalosporin use.  Jonne Ply. Asa [Aspirin] Other (See Comments)    gastric bypass   . Ibuprofen Other (See Comments)    gastric bypass  . Morphine And Related Nausea And Vomiting and Other (See Comments)    headaches  . Adhesive [Tape] Rash    Please use paper tape, can use cloth tape  States plastic bandaids irritate   Prior to Admission medications   Medication Sig Start Date End Date Taking? Authorizing Provider  acetaminophen (TYLENOL) 500 MG tablet Take 500 mg by mouth daily as needed for  headache.    Historical Provider, MD  B Complex Vitamins (VITAMIN B COMPLEX PO) Place under the tongue daily. One dropperful    Historical Provider, MD  buPROPion (WELLBUTRIN SR) 150 MG 12 hr tablet Take 150 mg by mouth 2 (two) times daily.    Historical Provider, MD  diclofenac sodium (VOLTAREN) 1 % GEL Apply 1 application topically 4 (four) times daily as needed (pain).    Historical Provider, MD  DULoxetine (CYMBALTA) 30 MG capsule Take 30 mg by mouth daily. Take with a 60 mg capsule for a 90 mg dose    Historical Provider, MD  DULoxetine (CYMBALTA) 60 MG capsule Take 60 mg by mouth daily. Take with a 30 mg capsule for a 90 mg dose    Historical Provider, MD  fentaNYL (DURAGESIC - DOSED MCG/HR) 50 MCG/HR Place 50 mcg onto the skin every 3 (three) days.    Historical Provider, MD  ferrous sulfate 325 (65 FE) MG tablet Take 325 mg by mouth 2 (two) times daily with a meal.    Historical Provider, MD  Iron-FA-B Cmp-C-Biot-Probiotic (FUSION PLUS PO) Take 1 tablet by mouth daily.    Historical Provider, MD  lamoTRIgine (LAMICTAL) 100 MG tablet Take 100 mg by mouth daily.    Historical Provider, MD  levocetirizine (XYZAL) 5 MG tablet Take 5 mg by mouth every evening.    Historical Provider, MD  Magnesium 250 MG TABS Take 1 tablet by mouth daily.     Historical Provider, MD  Oxycodone HCl 10 MG TABS Take 1 tablet (10 mg total) by mouth every 8 (eight) hours as needed. 12/17/15   Marily MemosJason Mesner, MD  oxyCODONE-acetaminophen (PERCOCET) 10-325 MG tablet Take 1 tablet by mouth every 4 (four) hours as needed for pain.    Historical Provider, MD  oxymetazoline (AFRIN) 0.05 % nasal spray Place 1 spray into both nostrils 4 (four) times daily as needed (nasal congestion).    Historical Provider, MD  polyethylene glycol-electrolytes (TRILYTE) 420 g solution Take 4,000 mLs by mouth as directed. 11/26/15   Corbin Adeobert M Rourk, MD  Vitamin D, Ergocalciferol, (DRISDOL) 50000 units CAPS capsule Take 50,000 Units by mouth every 7  (seven) days. On Sundays    Historical Provider, MD   Social History   Social History  . Marital status: Married    Spouse name: N/A  . Number of children: 3  . Years of education: HS   Occupational History  . [employer]    Social History Main Topics  . Smoking status: Never Smoker  . Smokeless tobacco: Former NeurosurgeonUser    Types: Chew    Quit date: 07/15/1994  . Alcohol use 0.0 oz/week     Comment: One every 1-2 months  . Drug use: No  . Sexual activity:  Yes    Birth control/ protection: None   Other Topics Concern  . Not on file   Social History Narrative   Drinks 10 cups of coffee    Family History  Problem Relation Age of Onset  . Heart failure Mother   . Diabetes Mother   . COPD Mother   . Cancer Father   . Arthritis Father   . Colon cancer Neg Hx     ROS: Currently denies lightheadedness, dizziness, Fever, chills, CP, SOB.   No personal history of DVT, PE, MI, or CVA. No loose teeth or dentures.   All other systems have been reviewed and were otherwise currently negative with the exception of those mentioned in the HPI and as above.  Objective: Vitals: Ht: 5'11" Wt: 295 Temp: 98.6 BP: 148/99 Pulse: 78 O2 98% on room air. Physical Exam: General: Alert, NAD. Trendelenberg Gait - utilizes a cane. HEENT: EOMI, Good Neck Extension  Pulm: No increased work of breathing.  Clear B/L A/P w/o crackle or wheeze.  CV: RRR, No m/g/r appreciated  GI: protuberant, soft, NT, ND Neuro: Neuro without gross focal deficit.  Sensation intact distally Skin: No lesions in the area of chief complaint MSK/Surgical Site: Right Hip moderately tender over greater trochanter.  Externally rotated at rest.  Pain with passive ROM, especially internal rotation.  Positive Stinchfield.  5/5 strength.  NVI.  Sensation intact distally.  Imaging Review Plain radiographs demonstrate severe degenerative joint disease of the right hip.   Assessment: OA RIGHT HIP Principal Problem:   Primary  osteoarthritis of right hip Active Problems:   Hypertension   Hyperlipemia   Osteoarthritis   Chronic pain   Sleep apnea   History of gastric bypass   Plan: Plan for Procedure(s): TOTAL HIP ARTHROPLASTY ANTERIOR APPROACH  The patient history, physical exam, clinical judgement of the provider and imaging are consistent with end stage degenerative joint disease and total joint arthroplasty is deemed medically necessary. The treatment options including medical management, injection therapy, and arthroplasty were discussed at length. The risks and benefits of Procedure(s): TOTAL HIP ARTHROPLASTY ANTERIOR APPROACH were presented and reviewed.  The risks of nonoperative treatment, versus surgical intervention including but not limited to continued pain, aseptic loosening, stiffness, dislocation/subluxation, infection, bleeding, nerve injury, blood clots, cardiopulmonary complications, morbidity, mortality, among others were discussed. The patient verbalizes understanding and wishes to proceed with the plan.  Patient is being admitted for inpatient treatment for surgery, pain control, PT, OT, prophylactic antibiotics, VTE prophylaxis, progressive ambulation, ADL's and discharge planning.   Dental prophylaxis discussed and recommended for 2 years postoperatively.  The patient does meet the criteria for TXA which will be used perioperatively via IV.   Xarelto  will be used postoperatively for DVT prophylaxis in addition to SCDs, and early ambulation.  Avoiding NSAIDs dt h/o gastric bypass. Dilaudid for breakthrough IV medicine.  He is taking home P! Medicines regularly as prescribed. The patient is planning to be discharged home with home health services in care of his wife.  Albina Billet III, PA-C 07/21/2016 8:09 AM

## 2016-07-25 ENCOUNTER — Other Ambulatory Visit (HOSPITAL_COMMUNITY): Payer: Self-pay | Admitting: *Deleted

## 2016-07-25 ENCOUNTER — Inpatient Hospital Stay (HOSPITAL_COMMUNITY)
Admission: RE | Admit: 2016-07-25 | Discharge: 2016-07-25 | Disposition: A | Discharge status: Home / Self Care | Payer: Self-pay | Source: Ambulatory Visit

## 2016-07-25 NOTE — Pre-Procedure Instructions (Signed)
Mark BankerJames R [name]  07/25/2016    Your procedure is scheduled on Tuesday, August 05, 2016 at 12:00 N   [facility] at 10:00 AM.   Call this number if you have problems the morning of surgery: [phone]   Questions prior to day of surgery, please call [phone] between 8 & 4 PM.   Remember:  Do not eat food or drink liquids after midnight Monday, 08/04/16.  Take these medicines the morning of surgery with A SIP OF WATER: Bupropion (Wellbutrin), Duloxetine (Cymbalta), Gabapentin (Neurontin), Lamotrigine (Lamictal), Oxycontin, Pro Air inhaler - if needed (bring inhaler with you day of surgery)  Do not use Aspirin products or NSAIDS (Ibuprofen, Aleve, etc.) 5 days prior to surgery.   Do not wear jewelry.  Do not wear lotions, powders, or cologne.  Men may shave face and neck.  Do not bring valuables to the hospital.  [facility] is not responsible for any belongings or valuables.  Contacts, dentures or bridgework may not be worn into surgery.  Leave your suitcase in the car.  After surgery it may be brought to your room.  For patients admitted to the hospital, discharge time will be determined by your treatment team.  Special instructions:  [place] - Preparing for Surgery  Before surgery, you can play an important role.  Because skin is not sterile, your skin needs to be as free of germs as possible.  You can reduce the number of germs on you skin by washing with CHG (chlorahexidine gluconate) soap before surgery.  CHG is an antiseptic cleaner which kills germs and bonds with the skin to continue killing germs even after washing.  Please DO NOT use if you have an allergy to CHG or antibacterial soaps.  If your skin becomes reddened/irritated stop using the CHG and inform your nurse when you arrive at Short Stay.  Do not shave (including legs and underarms) for at least 48 hours prior to the first CHG shower.  You may shave your  face.  Please follow these instructions carefully:   1.  Shower with CHG Soap the night before surgery and the                    morning of Surgery.  2.  If you choose to wash your hair, wash your hair first as usual with your       normal shampoo.  3.  After you shampoo, rinse your hair and body thoroughly to remove the shampoo.  4.  Use CHG as you would any other liquid soap.  You can apply chg directly       to the skin and wash gently with scrungie or a clean washcloth.  5.  Apply the CHG Soap to your body ONLY FROM THE NECK DOWN.        Do not use on open wounds or open sores.  Avoid contact with your eyes, ears, mouth and genitals (private parts).  Wash genitals (private parts) with your normal soap.  6.  Wash thoroughly, paying special attention to the area where your surgery        will be performed.  7.  Thoroughly rinse your body with warm water from the neck down.  8.  DO NOT shower/wash with your normal soap after using and rinsing off       the CHG Soap.  9.  Pat yourself dry with a clean towel.  10.  Wear clean pajamas.            11.  Place clean sheets on your bed the night of your first shower and do not        sleep with pets.  Day of Surgery  Do not apply any lotions the morning of surgery.  Please wear clean clothes to the hospital.   Please read over the fact sheets that you were given.

## 2016-07-29 ENCOUNTER — Encounter (HOSPITAL_COMMUNITY): Payer: Self-pay

## 2016-07-29 ENCOUNTER — Encounter (HOSPITAL_COMMUNITY)
Admission: RE | Admit: 2016-07-29 | Discharge: 2016-07-29 | Disposition: A | Discharge status: Home / Self Care | Payer: BLUE CROSS/BLUE SHIELD | Source: Ambulatory Visit | Attending: Orthopedic Surgery | Admitting: Orthopedic Surgery

## 2016-07-29 DIAGNOSIS — Z01812 Encounter for preprocedural laboratory examination: Secondary | ICD-10-CM | POA: Diagnosis present

## 2016-07-29 DIAGNOSIS — M1611 Unilateral primary osteoarthritis, right hip: Secondary | ICD-10-CM | POA: Insufficient documentation

## 2016-07-29 LAB — BASIC METABOLIC PANEL
Anion gap: 9 (ref 5–15)
BUN: 7 mg/dL (ref 6–20)
CALCIUM: 8.7 mg/dL — AB (ref 8.9–10.3)
CO2: 28 mmol/L (ref 22–32)
Chloride: 100 mmol/L — ABNORMAL LOW (ref 101–111)
Creatinine, Ser: 0.9 mg/dL (ref 0.61–1.24)
Glucose, Bld: 111 mg/dL — ABNORMAL HIGH (ref 65–99)
POTASSIUM: 4.3 mmol/L (ref 3.5–5.1)
Sodium: 137 mmol/L (ref 135–145)

## 2016-07-29 LAB — SURGICAL PCR SCREEN
MRSA, PCR: NEGATIVE
STAPHYLOCOCCUS AUREUS: NEGATIVE

## 2016-07-29 LAB — CBC
HCT: 36.1 % — ABNORMAL LOW (ref 39.0–52.0)
HEMOGLOBIN: 11.9 g/dL — AB (ref 13.0–17.0)
MCH: 29.9 pg (ref 26.0–34.0)
MCHC: 33 g/dL (ref 30.0–36.0)
MCV: 90.7 fL (ref 78.0–100.0)
Platelets: 291 10*3/uL (ref 150–400)
RBC: 3.98 MIL/uL — ABNORMAL LOW (ref 4.22–5.81)
RDW: 11.9 % (ref 11.5–15.5)
WBC: 7.7 10*3/uL (ref 4.0–10.5)

## 2016-08-04 MED ORDER — SODIUM CHLORIDE 0.9 % IV SOLN
1500.0000 mg | INTRAVENOUS | Status: AC
  Administered 2016-08-05 (×2): 1500 mg via INTRAVENOUS
  Filled 2016-08-04: qty 1500

## 2016-08-04 MED ORDER — TRANEXAMIC ACID 1000 MG/10ML IV SOLN
1000.0000 mg | INTRAVENOUS | Status: AC
  Administered 2016-08-05: 1000 mg via INTRAVENOUS
  Filled 2016-08-04: qty 10

## 2016-08-05 ENCOUNTER — Inpatient Hospital Stay (HOSPITAL_COMMUNITY)
Admission: RE | Admit: 2016-08-05 | Discharge: 2016-08-07 | DRG: 470 | Disposition: A | Discharge status: Home Health | Payer: BLUE CROSS/BLUE SHIELD | Source: Ambulatory Visit | Attending: Orthopedic Surgery | Admitting: Orthopedic Surgery

## 2016-08-05 ENCOUNTER — Inpatient Hospital Stay (HOSPITAL_COMMUNITY): Payer: BLUE CROSS/BLUE SHIELD | Admitting: Certified Registered Nurse Anesthetist

## 2016-08-05 ENCOUNTER — Inpatient Hospital Stay (HOSPITAL_COMMUNITY): Payer: BLUE CROSS/BLUE SHIELD

## 2016-08-05 ENCOUNTER — Encounter (HOSPITAL_COMMUNITY): Payer: Self-pay | Admitting: *Deleted

## 2016-08-05 ENCOUNTER — Encounter (HOSPITAL_COMMUNITY)
Admission: RE | Disposition: A | Discharge status: Home Health | Payer: Self-pay | Source: Ambulatory Visit | Attending: Orthopedic Surgery

## 2016-08-05 DIAGNOSIS — Z9884 Bariatric surgery status: Secondary | ICD-10-CM | POA: Diagnosis not present

## 2016-08-05 DIAGNOSIS — G8929 Other chronic pain: Secondary | ICD-10-CM | POA: Diagnosis present

## 2016-08-05 DIAGNOSIS — D509 Iron deficiency anemia, unspecified: Secondary | ICD-10-CM | POA: Diagnosis present

## 2016-08-05 DIAGNOSIS — Z880 Allergy status to penicillin: Secondary | ICD-10-CM

## 2016-08-05 DIAGNOSIS — G4733 Obstructive sleep apnea (adult) (pediatric): Secondary | ICD-10-CM | POA: Diagnosis present

## 2016-08-05 DIAGNOSIS — E785 Hyperlipidemia, unspecified: Secondary | ICD-10-CM | POA: Diagnosis present

## 2016-08-05 DIAGNOSIS — E669 Obesity, unspecified: Secondary | ICD-10-CM | POA: Diagnosis present

## 2016-08-05 DIAGNOSIS — Z6841 Body Mass Index (BMI) 40.0 and over, adult: Secondary | ICD-10-CM | POA: Diagnosis not present

## 2016-08-05 DIAGNOSIS — M549 Dorsalgia, unspecified: Secondary | ICD-10-CM | POA: Diagnosis present

## 2016-08-05 DIAGNOSIS — K219 Gastro-esophageal reflux disease without esophagitis: Secondary | ICD-10-CM | POA: Diagnosis present

## 2016-08-05 DIAGNOSIS — Z885 Allergy status to narcotic agent status: Secondary | ICD-10-CM

## 2016-08-05 DIAGNOSIS — Z79899 Other long term (current) drug therapy: Secondary | ICD-10-CM

## 2016-08-05 DIAGNOSIS — Z96653 Presence of artificial knee joint, bilateral: Secondary | ICD-10-CM | POA: Diagnosis present

## 2016-08-05 DIAGNOSIS — G4730 Sleep apnea, unspecified: Secondary | ICD-10-CM | POA: Diagnosis present

## 2016-08-05 DIAGNOSIS — M1611 Unilateral primary osteoarthritis, right hip: Secondary | ICD-10-CM | POA: Diagnosis present

## 2016-08-05 DIAGNOSIS — M25551 Pain in right hip: Secondary | ICD-10-CM | POA: Diagnosis present

## 2016-08-05 DIAGNOSIS — Z886 Allergy status to analgesic agent status: Secondary | ICD-10-CM | POA: Diagnosis not present

## 2016-08-05 DIAGNOSIS — Z8249 Family history of ischemic heart disease and other diseases of the circulatory system: Secondary | ICD-10-CM

## 2016-08-05 DIAGNOSIS — Z91048 Other nonmedicinal substance allergy status: Secondary | ICD-10-CM | POA: Diagnosis not present

## 2016-08-05 DIAGNOSIS — Z888 Allergy status to other drugs, medicaments and biological substances status: Secondary | ICD-10-CM | POA: Diagnosis not present

## 2016-08-05 DIAGNOSIS — F419 Anxiety disorder, unspecified: Secondary | ICD-10-CM | POA: Diagnosis present

## 2016-08-05 DIAGNOSIS — F329 Major depressive disorder, single episode, unspecified: Secondary | ICD-10-CM | POA: Diagnosis present

## 2016-08-05 DIAGNOSIS — I10 Essential (primary) hypertension: Secondary | ICD-10-CM | POA: Diagnosis present

## 2016-08-05 DIAGNOSIS — M1990 Unspecified osteoarthritis, unspecified site: Secondary | ICD-10-CM | POA: Diagnosis present

## 2016-08-05 DIAGNOSIS — Z419 Encounter for procedure for purposes other than remedying health state, unspecified: Secondary | ICD-10-CM

## 2016-08-05 DIAGNOSIS — M170 Bilateral primary osteoarthritis of knee: Secondary | ICD-10-CM

## 2016-08-05 HISTORY — PX: TOTAL HIP ARTHROPLASTY: SHX124

## 2016-08-05 SURGERY — ARTHROPLASTY, HIP, TOTAL, ANTERIOR APPROACH
Anesthesia: General | Site: Hip | Laterality: Right

## 2016-08-05 MED ORDER — DOCUSATE SODIUM 100 MG PO CAPS
100.0000 mg | ORAL_CAPSULE | Freq: Two times a day (BID) | ORAL | Status: DC
  Administered 2016-08-05 – 2016-08-07 (×4): 100 mg via ORAL
  Filled 2016-08-05 (×4): qty 1

## 2016-08-05 MED ORDER — ONDANSETRON HCL 4 MG/2ML IJ SOLN: 4.0000 mg | Freq: Four times a day (QID) | INTRAMUSCULAR | Status: DC | PRN

## 2016-08-05 MED ORDER — DIPHENHYDRAMINE HCL 12.5 MG/5ML PO ELIX: 12.5000 mg | ORAL_SOLUTION | ORAL | Status: DC | PRN

## 2016-08-05 MED ORDER — OXYCODONE HCL 5 MG PO TABS: 5.0000 mg | ORAL_TABLET | ORAL | Status: DC | PRN

## 2016-08-05 MED ORDER — ALBUTEROL SULFATE (2.5 MG/3ML) 0.083% IN NEBU: 2.5000 mg | INHALATION_SOLUTION | RESPIRATORY_TRACT | Status: DC | PRN

## 2016-08-05 MED ORDER — ONDANSETRON HCL 4 MG PO TABS: 4.0000 mg | ORAL_TABLET | Freq: Three times a day (TID) | ORAL | 0 refills | Status: DC | PRN

## 2016-08-05 MED ORDER — 0.9 % SODIUM CHLORIDE (POUR BTL) OPTIME
TOPICAL | Status: DC | PRN
  Administered 2016-08-05 (×2): 1000 mL

## 2016-08-05 MED ORDER — FENTANYL CITRATE (PF) 100 MCG/2ML IJ SOLN
INTRAMUSCULAR | Status: AC
  Filled 2016-08-05: qty 2

## 2016-08-05 MED ORDER — MENTHOL 3 MG MT LOZG: 1.0000 | LOZENGE | OROMUCOSAL | Status: DC | PRN

## 2016-08-05 MED ORDER — METHOCARBAMOL 750 MG PO TABS
750.0000 mg | ORAL_TABLET | Freq: Four times a day (QID) | ORAL | Status: DC | PRN
  Administered 2016-08-06 – 2016-08-07 (×6): 750 mg via ORAL
  Filled 2016-08-05 (×7): qty 1

## 2016-08-05 MED ORDER — LORATADINE 10 MG PO TABS
10.0000 mg | ORAL_TABLET | Freq: Every evening | ORAL | Status: DC
  Administered 2016-08-05 – 2016-08-06 (×2): 10 mg via ORAL
  Filled 2016-08-05 (×2): qty 1

## 2016-08-05 MED ORDER — BUPIVACAINE HCL (PF) 0.25 % IJ SOLN
INTRAMUSCULAR | Status: AC
  Filled 2016-08-05: qty 30

## 2016-08-05 MED ORDER — CHLORHEXIDINE GLUCONATE 4 % EX LIQD: 60.0000 mL | Freq: Once | CUTANEOUS | Status: DC

## 2016-08-05 MED ORDER — ACETAMINOPHEN 500 MG PO TABS
1000.0000 mg | ORAL_TABLET | Freq: Once | ORAL | Status: AC
  Administered 2016-08-05: 1000 mg via ORAL
  Filled 2016-08-05: qty 2

## 2016-08-05 MED ORDER — HYDROMORPHONE HCL 1 MG/ML IJ SOLN
0.2500 mg | INTRAMUSCULAR | Status: AC | PRN
  Administered 2016-08-05 (×4): 0.5 mg via INTRAVENOUS

## 2016-08-05 MED ORDER — RIVAROXABAN 10 MG PO TABS
10.0000 mg | ORAL_TABLET | Freq: Every day | ORAL | Status: DC
  Administered 2016-08-06 – 2016-08-07 (×2): 10 mg via ORAL
  Filled 2016-08-05 (×2): qty 1

## 2016-08-05 MED ORDER — TAMSULOSIN HCL 0.4 MG PO CAPS
0.4000 mg | ORAL_CAPSULE | Freq: Every evening | ORAL | Status: DC
  Administered 2016-08-06: 0.4 mg via ORAL
  Filled 2016-08-05: qty 1

## 2016-08-05 MED ORDER — GABAPENTIN 100 MG PO CAPS
100.0000 mg | ORAL_CAPSULE | Freq: Three times a day (TID) | ORAL | Status: DC
  Administered 2016-08-05 – 2016-08-07 (×4): 100 mg via ORAL
  Filled 2016-08-05 (×5): qty 1

## 2016-08-05 MED ORDER — HYDROMORPHONE HCL 2 MG/ML IJ SOLN
1.0000 mg | INTRAMUSCULAR | Status: DC | PRN
  Administered 2016-08-05 – 2016-08-07 (×3): 1 mg via INTRAVENOUS
  Filled 2016-08-05 (×3): qty 1

## 2016-08-05 MED ORDER — SENNA 8.6 MG PO TABS
1.0000 | ORAL_TABLET | Freq: Two times a day (BID) | ORAL | Status: DC
  Administered 2016-08-05 – 2016-08-07 (×4): 8.6 mg via ORAL
  Filled 2016-08-05 (×4): qty 1

## 2016-08-05 MED ORDER — FERROUS SULFATE 325 (65 FE) MG PO TABS
325.0000 mg | ORAL_TABLET | Freq: Every day | ORAL | Status: DC
  Administered 2016-08-06 – 2016-08-07 (×2): 325 mg via ORAL
  Filled 2016-08-05 (×2): qty 1

## 2016-08-05 MED ORDER — EPINEPHRINE PF 1 MG/ML IJ SOLN
INTRAMUSCULAR | Status: AC
  Filled 2016-08-05: qty 1

## 2016-08-05 MED ORDER — PHENOL 1.4 % MT LIQD
1.0000 | OROMUCOSAL | Status: DC | PRN
Start: 2016-08-05 — End: 2016-08-07

## 2016-08-05 MED ORDER — ACETAMINOPHEN 650 MG RE SUPP: 650.0000 mg | Freq: Four times a day (QID) | RECTAL | Status: DC | PRN

## 2016-08-05 MED ORDER — KETOROLAC TROMETHAMINE 30 MG/ML IJ SOLN
INTRAMUSCULAR | Status: DC | PRN
  Administered 2016-08-05: 30 mg via INTRA_ARTICULAR

## 2016-08-05 MED ORDER — KETOROLAC TROMETHAMINE 15 MG/ML IJ SOLN
15.0000 mg | Freq: Four times a day (QID) | INTRAMUSCULAR | Status: AC
  Administered 2016-08-05 – 2016-08-06 (×4): 15 mg via INTRAVENOUS
  Filled 2016-08-05 (×4): qty 1

## 2016-08-05 MED ORDER — ROCURONIUM BROMIDE 100 MG/10ML IV SOLN
INTRAVENOUS | Status: DC | PRN
  Administered 2016-08-05: 50 mg via INTRAVENOUS
  Administered 2016-08-05 (×3): 20 mg via INTRAVENOUS

## 2016-08-05 MED ORDER — FENTANYL CITRATE (PF) 100 MCG/2ML IJ SOLN
25.0000 ug | INTRAMUSCULAR | Status: DC | PRN
  Administered 2016-08-05 (×3): 50 ug via INTRAVENOUS

## 2016-08-05 MED ORDER — PROPOFOL 10 MG/ML IV BOLUS
INTRAVENOUS | Status: AC
  Filled 2016-08-05: qty 20

## 2016-08-05 MED ORDER — RIVAROXABAN 10 MG PO TABS: 10.0000 mg | ORAL_TABLET | Freq: Every day | ORAL | 0 refills | Status: DC

## 2016-08-05 MED ORDER — GABAPENTIN 300 MG PO CAPS
300.0000 mg | ORAL_CAPSULE | Freq: Once | ORAL | Status: AC
  Administered 2016-08-05: 300 mg via ORAL
  Filled 2016-08-05: qty 1

## 2016-08-05 MED ORDER — TRANEXAMIC ACID 1000 MG/10ML IV SOLN
2000.0000 mg | Freq: Once | INTRAVENOUS | Status: DC
  Filled 2016-08-05: qty 20

## 2016-08-05 MED ORDER — LACTATED RINGERS IV SOLN
INTRAVENOUS | Status: DC
  Administered 2016-08-06: 04:00:00 via INTRAVENOUS

## 2016-08-05 MED ORDER — DOCUSATE SODIUM 100 MG PO CAPS: 100.0000 mg | ORAL_CAPSULE | Freq: Two times a day (BID) | ORAL | 0 refills | Status: DC

## 2016-08-05 MED ORDER — FLEET ENEMA 7-19 GM/118ML RE ENEM: 1.0000 | ENEMA | Freq: Once | RECTAL | Status: DC | PRN

## 2016-08-05 MED ORDER — ONDANSETRON HCL 4 MG/2ML IJ SOLN
INTRAMUSCULAR | Status: DC | PRN
  Administered 2016-08-05: 4 mg via INTRAVENOUS

## 2016-08-05 MED ORDER — FENTANYL CITRATE (PF) 100 MCG/2ML IJ SOLN
INTRAMUSCULAR | Status: AC
Start: 2016-08-05 — End: 2016-08-06
  Filled 2016-08-05: qty 2

## 2016-08-05 MED ORDER — MIDAZOLAM HCL 2 MG/2ML IJ SOLN
INTRAMUSCULAR | Status: AC
  Filled 2016-08-05: qty 2

## 2016-08-05 MED ORDER — OXYCODONE HCL ER 20 MG PO T12A
40.0000 mg | EXTENDED_RELEASE_TABLET | Freq: Two times a day (BID) | ORAL | Status: DC
  Administered 2016-08-05 – 2016-08-07 (×4): 40 mg via ORAL
  Filled 2016-08-05 (×4): qty 2

## 2016-08-05 MED ORDER — VANCOMYCIN HCL IN DEXTROSE 1-5 GM/200ML-% IV SOLN
1000.0000 mg | Freq: Two times a day (BID) | INTRAVENOUS | Status: AC
  Administered 2016-08-05: 1000 mg via INTRAVENOUS
  Filled 2016-08-05: qty 200

## 2016-08-05 MED ORDER — FENTANYL CITRATE (PF) 100 MCG/2ML IJ SOLN
INTRAMUSCULAR | Status: AC
  Filled 2016-08-05: qty 4

## 2016-08-05 MED ORDER — HYDROMORPHONE HCL 1 MG/ML IJ SOLN
INTRAMUSCULAR | Status: AC
  Filled 2016-08-05: qty 1

## 2016-08-05 MED ORDER — LAMOTRIGINE 100 MG PO TABS
100.0000 mg | ORAL_TABLET | Freq: Every day | ORAL | Status: DC
  Administered 2016-08-06 – 2016-08-07 (×2): 100 mg via ORAL
  Filled 2016-08-05 (×2): qty 1

## 2016-08-05 MED ORDER — POLYETHYLENE GLYCOL 3350 17 G PO PACK: 17.0000 g | PACK | Freq: Every day | ORAL | Status: DC | PRN

## 2016-08-05 MED ORDER — FUSION PLUS PO CAPS: 1.0000 | ORAL_CAPSULE | Freq: Every day | ORAL | Status: DC

## 2016-08-05 MED ORDER — METOCLOPRAMIDE HCL 5 MG PO TABS: 5.0000 mg | ORAL_TABLET | Freq: Three times a day (TID) | ORAL | Status: DC | PRN

## 2016-08-05 MED ORDER — ACETAMINOPHEN 325 MG PO TABS
650.0000 mg | ORAL_TABLET | Freq: Four times a day (QID) | ORAL | Status: AC
  Administered 2016-08-05 – 2016-08-06 (×4): 650 mg via ORAL
  Filled 2016-08-05 (×4): qty 2

## 2016-08-05 MED ORDER — TRANEXAMIC ACID 1000 MG/10ML IV SOLN
2000.0000 mg | Freq: Once | INTRAVENOUS | Status: AC
  Administered 2016-08-05: 2000 mg via TOPICAL
  Filled 2016-08-05: qty 20

## 2016-08-05 MED ORDER — DULOXETINE HCL 60 MG PO CPEP
90.0000 mg | ORAL_CAPSULE | Freq: Every day | ORAL | Status: DC
  Administered 2016-08-05 – 2016-08-07 (×3): 90 mg via ORAL
  Filled 2016-08-05 (×4): qty 1

## 2016-08-05 MED ORDER — ROCURONIUM BROMIDE 50 MG/5ML IV SOSY
PREFILLED_SYRINGE | INTRAVENOUS | Status: AC
  Filled 2016-08-05: qty 5

## 2016-08-05 MED ORDER — OXYCODONE HCL 5 MG PO TABS
30.0000 mg | ORAL_TABLET | ORAL | Status: DC | PRN
  Administered 2016-08-05 – 2016-08-07 (×10): 30 mg via ORAL
  Filled 2016-08-05 (×9): qty 6

## 2016-08-05 MED ORDER — SORBITOL 70 % SOLN: 30.0000 mL | Freq: Every day | Status: DC | PRN

## 2016-08-05 MED ORDER — MIDAZOLAM HCL 5 MG/5ML IJ SOLN
INTRAMUSCULAR | Status: DC | PRN
  Administered 2016-08-05: 2 mg via INTRAVENOUS

## 2016-08-05 MED ORDER — ACETAMINOPHEN 325 MG PO TABS: 650.0000 mg | ORAL_TABLET | Freq: Four times a day (QID) | ORAL | Status: DC | PRN

## 2016-08-05 MED ORDER — PROPOFOL 10 MG/ML IV BOLUS
INTRAVENOUS | Status: DC | PRN
  Administered 2016-08-05: 200 mg via INTRAVENOUS

## 2016-08-05 MED ORDER — BUPROPION HCL ER (SR) 150 MG PO TB12
150.0000 mg | ORAL_TABLET | Freq: Two times a day (BID) | ORAL | Status: DC
  Administered 2016-08-05 – 2016-08-07 (×4): 150 mg via ORAL
  Filled 2016-08-05 (×4): qty 1

## 2016-08-05 MED ORDER — SUGAMMADEX SODIUM 200 MG/2ML IV SOLN
INTRAVENOUS | Status: DC | PRN
  Administered 2016-08-05: 300 mg via INTRAVENOUS

## 2016-08-05 MED ORDER — DEXAMETHASONE SODIUM PHOSPHATE 10 MG/ML IJ SOLN
10.0000 mg | Freq: Once | INTRAMUSCULAR | Status: AC
  Administered 2016-08-06: 10 mg via INTRAVENOUS
  Filled 2016-08-05: qty 1

## 2016-08-05 MED ORDER — FENTANYL CITRATE (PF) 100 MCG/2ML IJ SOLN
INTRAMUSCULAR | Status: DC | PRN
  Administered 2016-08-05: 100 ug via INTRAVENOUS
  Administered 2016-08-05: 50 ug via INTRAVENOUS
  Administered 2016-08-05 (×2): 100 ug via INTRAVENOUS
  Administered 2016-08-05: 50 ug via INTRAVENOUS

## 2016-08-05 MED ORDER — HYDROMORPHONE HCL 2 MG PO TABS: 2.0000 mg | ORAL_TABLET | Freq: Two times a day (BID) | ORAL | 0 refills | Status: DC | PRN

## 2016-08-05 MED ORDER — BUPIVACAINE-EPINEPHRINE 0.25% -1:200000 IJ SOLN
INTRAMUSCULAR | Status: DC | PRN
  Administered 2016-08-05: 30 mL

## 2016-08-05 MED ORDER — SUCCINYLCHOLINE CHLORIDE 20 MG/ML IJ SOLN
INTRAMUSCULAR | Status: DC | PRN
  Administered 2016-08-05: 120 mg via INTRAVENOUS

## 2016-08-05 MED ORDER — METOCLOPRAMIDE HCL 5 MG/ML IJ SOLN: 5.0000 mg | Freq: Three times a day (TID) | INTRAMUSCULAR | Status: DC | PRN

## 2016-08-05 MED ORDER — LACTATED RINGERS IV SOLN
INTRAVENOUS | Status: DC
  Administered 2016-08-05 (×3): via INTRAVENOUS

## 2016-08-05 MED ORDER — LIDOCAINE HCL (CARDIAC) 20 MG/ML IV SOLN
INTRAVENOUS | Status: DC | PRN
  Administered 2016-08-05: 100 mg via INTRATRACHEAL

## 2016-08-05 MED ORDER — ONDANSETRON HCL 4 MG PO TABS: 4.0000 mg | ORAL_TABLET | Freq: Four times a day (QID) | ORAL | Status: DC | PRN

## 2016-08-05 MED ORDER — OXYCODONE HCL 5 MG PO TABS
ORAL_TABLET | ORAL | Status: AC
  Filled 2016-08-05: qty 6

## 2016-08-05 MED ORDER — KETOROLAC TROMETHAMINE 30 MG/ML IJ SOLN
INTRAMUSCULAR | Status: AC
  Filled 2016-08-05: qty 1

## 2016-08-05 SURGICAL SUPPLY — 51 items
BAG DECANTER FOR FLEXI CONT (MISCELLANEOUS) IMPLANT
BLADE SAG 18X100X1.27 (BLADE) ×2 IMPLANT
BLADE SAW SGTL 18X1.27X75 (BLADE) ×2 IMPLANT
CAPT HIP TOTAL 2 ×2 IMPLANT
CLSR STERI-STRIP ANTIMIC 1/2X4 (GAUZE/BANDAGES/DRESSINGS) ×2 IMPLANT
COVER PERINEAL POST (MISCELLANEOUS) ×2 IMPLANT
COVER SURGICAL LIGHT HANDLE (MISCELLANEOUS) ×2 IMPLANT
DRAPE C-ARM 42X72 X-RAY (DRAPES) ×2 IMPLANT
DRAPE STERI IOBAN 125X83 (DRAPES) ×2 IMPLANT
DRAPE U-SHAPE 47X51 STRL (DRAPES) ×4 IMPLANT
DRSG MEPILEX BORDER 4X8 (GAUZE/BANDAGES/DRESSINGS) ×2 IMPLANT
DURAPREP 26ML APPLICATOR (WOUND CARE) ×2 IMPLANT
ELECT BLADE 4.0 EZ CLEAN MEGAD (MISCELLANEOUS) ×2
ELECT REM PT RETURN 9FT ADLT (ELECTROSURGICAL) ×2
ELECTRODE BLDE 4.0 EZ CLN MEGD (MISCELLANEOUS) ×1 IMPLANT
ELECTRODE REM PT RTRN 9FT ADLT (ELECTROSURGICAL) ×1 IMPLANT
FACESHIELD WRAPAROUND (MASK) ×4 IMPLANT
FILTER STRAW FLUID ASPIR (MISCELLANEOUS) ×2 IMPLANT
GLOVE BIO SURGEON STRL SZ7.5 (GLOVE) ×6 IMPLANT
GLOVE BIOGEL PI IND STRL 8 (GLOVE) ×2 IMPLANT
GLOVE BIOGEL PI INDICATOR 8 (GLOVE) ×2
GOWN STRL REUS W/ TWL LRG LVL3 (GOWN DISPOSABLE) ×5 IMPLANT
GOWN STRL REUS W/TWL LRG LVL3 (GOWN DISPOSABLE) ×5
KIT BASIN OR (CUSTOM PROCEDURE TRAY) ×2 IMPLANT
KIT ROOM TURNOVER OR (KITS) ×2 IMPLANT
MANIFOLD NEPTUNE II (INSTRUMENTS) ×2 IMPLANT
NDL SAFETY ECLIPSE 18X1.5 (NEEDLE) ×2 IMPLANT
NEEDLE HYPO 18GX1.5 SHARP (NEEDLE) ×2
NEEDLE HYPO 22GX1.5 SAFETY (NEEDLE) ×2 IMPLANT
NS IRRIG 1000ML POUR BTL (IV SOLUTION) ×2 IMPLANT
PACK TOTAL JOINT (CUSTOM PROCEDURE TRAY) ×2 IMPLANT
PACK UNIVERSAL I (CUSTOM PROCEDURE TRAY) IMPLANT
PAD ARMBOARD 7.5X6 YLW CONV (MISCELLANEOUS) ×2 IMPLANT
PENCIL BUTTON BLDE SNGL 10FT (ELECTRODE) ×2 IMPLANT
SPONGE LAP 18X18 X RAY DECT (DISPOSABLE) IMPLANT
STRIP CLOSURE SKIN 1/2X4 (GAUZE/BANDAGES/DRESSINGS) ×2 IMPLANT
SUT MNCRL AB 4-0 PS2 18 (SUTURE) ×2 IMPLANT
SUT MON AB 2-0 CT1 36 (SUTURE) ×4 IMPLANT
SUT VIC AB 0 CT1 27 (SUTURE) ×1
SUT VIC AB 0 CT1 27XBRD ANBCTR (SUTURE) ×1 IMPLANT
SUT VIC AB 1 CT1 27 (SUTURE) ×2
SUT VIC AB 1 CT1 27XBRD ANBCTR (SUTURE) ×2 IMPLANT
SYR 50ML LL SCALE MARK (SYRINGE) ×2 IMPLANT
SYR TB 1ML LUER SLIP (SYRINGE) ×2 IMPLANT
SYRINGE 20CC LL (MISCELLANEOUS) IMPLANT
TOWEL OR 17X24 6PK STRL BLUE (TOWEL DISPOSABLE) ×2 IMPLANT
TOWEL OR 17X26 10 PK STRL BLUE (TOWEL DISPOSABLE) ×2 IMPLANT
TRAY CATH 16FR W/PLASTIC CATH (SET/KITS/TRAYS/PACK) IMPLANT
TRAY FOLEY CATH 16FRSI W/METER (SET/KITS/TRAYS/PACK) ×2 IMPLANT
WATER STERILE IRR 1000ML POUR (IV SOLUTION) ×2 IMPLANT
YANKAUER SUCT BULB TIP NO VENT (SUCTIONS) ×2 IMPLANT

## 2016-08-05 NOTE — Anesthesia Postprocedure Evaluation (Signed)
Anesthesia Post Note  Patient: Mark BankerJames R Kaeding  Procedure(s) Performed: Procedure(s) (LRB): TOTAL HIP ARTHROPLASTY ANTERIOR APPROACH (Right)  Patient location during evaluation: PACU Anesthesia Type: General Level of consciousness: awake and alert and oriented Pain management: pain level controlled Vital Signs Assessment: post-procedure vital signs reviewed and stable Respiratory status: spontaneous breathing, nonlabored ventilation, respiratory function stable and patient connected to nasal cannula oxygen Cardiovascular status: blood pressure returned to baseline and stable Postop Assessment: no signs of nausea or vomiting Anesthetic complications: no       Last Vitals:  Vitals:   08/05/16 2245 08/05/16 2259  BP: (!) 153/85   Pulse: 86 80  Resp: 16 18  Temp: 36.9 C     Last Pain:  Vitals:   08/05/16 2245  TempSrc: Oral  PainSc:                  [name] A.

## 2016-08-05 NOTE — Transfer of Care (Signed)
Immediate Anesthesia Transfer of Care Note  Patient: Mark BankerJames R Radliff  Procedure(s) Performed: Procedure(s): TOTAL HIP ARTHROPLASTY ANTERIOR APPROACH (Right)  Patient Location: PACU  Anesthesia Type:General  Level of Consciousness: awake, alert , oriented and patient cooperative  Airway & Oxygen Therapy: Patient Spontanous Breathing and Patient connected to nasal cannula oxygen  Post-op Assessment: Report given to RN and Post -op Vital signs reviewed and stable  Post vital signs: Reviewed and stable  Last Vitals:  Vitals:   08/05/16 1318 08/05/16 1703  BP: 131/64 131/75  Pulse: (!) 57 83  Resp: 18 20  Temp: 36.9 C 36.2 C    Last Pain:  Vitals:   08/05/16 1703  TempSrc:   PainSc: 9          Complications: No apparent anesthesia complications

## 2016-08-05 NOTE — Anesthesia Procedure Notes (Signed)
Procedure Name: Intubation Date/Time: 08/05/2016 2:16 PM Performed by: Marena ChancyBECKNER, Loie Jahr S Pre-anesthesia Checklist: Patient identified, Emergency Drugs available, Suction available and Patient being monitored Patient Re-evaluated:Patient Re-evaluated prior to inductionOxygen Delivery Method: Circle System Utilized Preoxygenation: Pre-oxygenation with 100% oxygen Intubation Type: IV induction Ventilation: Mask ventilation without difficulty Laryngoscope Size: Miller and 3 Grade View: Grade I Tube type: Oral Tube size: 8.0 mm Number of attempts: 1 Airway Equipment and Method: Stylet and Oral airway Placement Confirmation: ETT inserted through vocal cords under direct vision,  positive ETCO2 and breath sounds checked- equal and bilateral Tube secured with: Tape Dental Injury: Teeth and Oropharynx as per pre-operative assessment

## 2016-08-05 NOTE — Interval H&P Note (Signed)
History and Physical Interval Note:  08/05/2016 12:59 PM  Mark [name]  has presented today for surgery, with the diagnosis of DJD RIGHT HIP  The various methods of treatment have been discussed with the patient and family. After consideration of risks, benefits and other options for treatment, the patient has consented to  Procedure(s): TOTAL HIP ARTHROPLASTY ANTERIOR APPROACH (Right) as a surgical intervention .  The patient's history has been reviewed, patient examined, no change in status, stable for surgery.  I have reviewed the patient's chart and labs.  Questions were answered to the patient's satisfaction.     Solmon Bohr D

## 2016-08-05 NOTE — Op Note (Signed)
08/05/2016  4:03 PM  PATIENT:  Mark [name]   MRN: [id number]  PRE-OPERATIVE DIAGNOSIS:  DJD RIGHT HIP  POST-OPERATIVE DIAGNOSIS:  DJD RIGHT HIP  PROCEDURE:  Procedure(s): TOTAL HIP ARTHROPLASTY ANTERIOR APPROACH  PREOPERATIVE INDICATIONS:    Mark [name] is an 51 y.o. male who has a diagnosis of Primary osteoarthritis of right hip and elected for surgical management after failing conservative treatment.  The risks benefits and alternatives were discussed with the patient including but not limited to the risks of nonoperative treatment, versus surgical intervention including infection, bleeding, nerve injury, periprosthetic fracture, the need for revision surgery, dislocation, leg length discrepancy, blood clots, cardiopulmonary complications, morbidity, mortality, among others, and they were willing to proceed.     OPERATIVE REPORT     SURGEON:   Arretta Toenjes, Ernesta Amble, MD    ASSISTANT:  Roxan Hockey, PA-C, he was present and scrubbed throughout the case, critical for completion in a timely fashion, and for retraction, instrumentation, and closure.     ANESTHESIA:  General    COMPLICATIONS:  None.     COMPONENTS:  Stryker acolade fit femur size 6 with a 36 mm -2.5 head ball and a PSL acetabular shell size 52 with a  polyethylene liner    PROCEDURE IN DETAIL:   The patient was met in the holding area and  identified.  The appropriate hip was identified and marked at the operative site.  The patient was then transported to the OR  and  placed under anesthesia per that record.  At that point, the patient was  placed in the supine position and  secured to the operating room table and all bony prominences padded. He received pre-operative antibiotics    The operative lower extremity was prepped from the iliac crest to the distal leg.  Sterile draping was performed.  Time out was performed prior to incision.      Skin incision was made just 2 cm lateral to the ASIS  extending in  line with the tensor fascia lata. Electrocautery was used to control all bleeders. I dissected down sharply to the fascia of the tensor fascia lata was confirmed that the muscle fibers beneath were running posteriorly. I then incised the fascia over the superficial tensor fascia lata in line with the incision. The fascia was elevated off the anterior aspect of the muscle the muscle was retracted posteriorly and protected throughout the case. I then used electrocautery to incise the tensor fascia lata fascia control and all bleeders. Immediately visible was the fat over top of the anterior neck and capsule.  I removed the anterior fat from the capsule and elevated the rectus muscle off of the anterior capsule. I then removed a large time of capsule. The retractors were then placed over the anterior acetabulum as well as around the superior and inferior neck.  I then removed a section of the femoral neck and a napkin ring fashion. Then used the power course to remove the femoral head from the acetabulum and thoroughly irrigated the acetabulum. I sized the femoral head.    I then exposed the deep acetabulum, cleared out any tissue including the ligamentum teres.   After adequate visualization, I excised the labrum, and then sequentially reamed.  I then impacted the acetabular implant into place using fluoroscopy for guidance.  Appropriate version and inclination was confirmed clinically matching their bony anatomy, and with fluoroscopy.  I placed a 20 mm screw in the posterior/superio position with an excellent bite.  I then placed the polyethylene liner in place  I then adducted the leg and released the external rotators from the posterior femur allowing it to be easily delivered up lateral and anterior to the acetabulum for preparation of the femoral canal.    I then prepared the proximal femur using the cookie-cutter and then sequentially reamed and broached.  A trial broach, neck, and head was  utilized, and I reduced the hip and used floroscopy to assess the neck length and femoral implant.  I then impacted the femoral prosthesis into place into the appropriate version. The hip was then reduced and fluoroscopy confirmed appropriate position. Leg lengths were restored.  I then irrigated the hip copiously again with, and repaired the fascia with Vicryl, followed by monocryl for the subcutaneous tissue, Monocryl for the skin, Steri-Strips and sterile gauze. The patient was then awakened and returned to PACU in stable and satisfactory condition. There were no complications.  POST OPERATIVE PLAN: WBAT, DVT px: SCD's/TED, ambulation and chemical dvt px  Mark Beel, MD Orthopedic Surgeon [phone]     

## 2016-08-05 NOTE — Anesthesia Preprocedure Evaluation (Addendum)
Anesthesia Evaluation  Patient identified by MRN, date of birth, ID band Patient awake    Reviewed: Allergy & Precautions, NPO status , Patient's Chart, lab work & pertinent test results  Airway Mallampati: II  TM Distance: >3 FB     Dental   Pulmonary sleep apnea , pneumonia,    breath sounds clear to auscultation       Cardiovascular hypertension,  Rhythm:Regular Rate:Normal     Neuro/Psych  Headaches,    GI/Hepatic Neg liver ROS, GERD  ,  Endo/Other  negative endocrine ROS  Renal/GU negative Renal ROS     Musculoskeletal   Abdominal   Peds  Hematology  (+) anemia ,   Anesthesia Other Findings   Reproductive/Obstetrics                             Anesthesia Physical Anesthesia Plan  ASA: III  Anesthesia Plan: General   Post-op Pain Management:    Induction: Intravenous  Airway Management Planned: Oral ETT  Additional Equipment:   Intra-op Plan:   Post-operative Plan: Extubation in OR  Informed Consent: I have reviewed the patients History and Physical, chart, labs and discussed the procedure including the risks, benefits and alternatives for the proposed anesthesia with the patient or authorized representative who has indicated his/her understanding and acceptance.   Dental advisory given  Plan Discussed with: CRNA and Anesthesiologist  Anesthesia Plan Comments:         Anesthesia Quick Evaluation

## 2016-08-05 NOTE — Progress Notes (Signed)
Placed patient on CPAP for the night with pressure set at 7cm and oxygen set at 2lpm

## 2016-08-06 ENCOUNTER — Encounter (HOSPITAL_COMMUNITY): Payer: Self-pay | Admitting: Orthopedic Surgery

## 2016-08-06 MED ORDER — OXYMETAZOLINE HCL 0.05 % NA SOLN
1.0000 | Freq: Four times a day (QID) | NASAL | Status: DC | PRN
  Administered 2016-08-06: 1 via NASAL
  Filled 2016-08-06: qty 15

## 2016-08-06 NOTE — Progress Notes (Signed)
Pt's wife arrived just before PT came for second session. She pulled RN aside and stated that she was concerned about pt going home today. She is worried that pt will essentially abuse his pain medicine because prior to admission he took doses more frequently than was prescribed. RN educated pt and his wife that he was responsible for managing his own pain medication, and that if he ran out- more would not be prescribed. He said he understood.  Per PT- pt could d/c home from mobility standpoint. Will discuss with Dr. Eulah PontMurphy or his PA.   Update- Pt will stay until tomorrow per PA.   Candy KitchenHudson, Latricia HeftKorie G

## 2016-08-06 NOTE — Progress Notes (Signed)
   Assessment: 1 Day Post-Op  S/P Procedure(s) (LRB): TOTAL HIP ARTHROPLASTY ANTERIOR APPROACH (Right) by Dr. Jewel Baizeimothy D. Eulah PontMurphy on 08/05/16  Principal Problem:   Primary osteoarthritis of right hip Active Problems:   Hypertension   Hyperlipemia   Osteoarthritis   Chronic pain   Sleep apnea   History of gastric bypass   Plan: Advance diet Up with therapy D/C IV fluids Discontinue foley  Weight Bearing: Weight Bearing as Tolerated (WBAT) Right leg Dressings: prn.  VTE prophylaxis: Xarelto, SCDs, ambulation Dispo: Home today pending PT evaluation  Subjective: Patient reports pain as moderate. Pain controlled with IV and PO meds.  Tolerating diet.  Urinating.  No CP, SOB.  Not yet OOB.  Objective:   VITALS:   Vitals:   08/05/16 1848 08/05/16 2245 08/05/16 2259 08/06/16 0519  BP: (!) 163/98 (!) 153/85  (!) 152/78  Pulse: 82 86 80 80  Resp: 20 16 18 16   Temp: 97.3 F (36.3 C) 98.4 F (36.9 C)  97.9 F (36.6 C)  TempSrc:  Oral  Oral  SpO2: 99% 100%  99%   CBC Latest Ref Rng & Units 07/29/2016 06/27/2016 12/19/2015  WBC 4.0 - 10.5 K/uL 7.7 7.3 5.6  Hemoglobin 13.0 - 17.0 g/dL 11.9(L) 12.1(L) 11.8(L)  Hematocrit 39.0 - 52.0 % 36.1(L) 35.9(L) 36.2(L)  Platelets 150 - 400 K/uL 291 282 307   BMP Latest Ref Rng & Units 07/29/2016 06/27/2016 12/19/2015  Glucose 65 - 99 mg/dL 409(W111(H) 94 90  BUN 6 - 20 mg/dL 7 6 14   Creatinine 0.61 - 1.24 mg/dL 1.190.90 1.470.95 8.290.91  Sodium 135 - 145 mmol/L 137 139 136  Potassium 3.5 - 5.1 mmol/L 4.3 4.2 4.1  Chloride 101 - 111 mmol/L 100(L) 105 102  CO2 22 - 32 mmol/L 28 26 27   Calcium 8.9 - 10.3 mg/dL 5.6(O8.7(L) 1.3(Y8.8(L) 8.6(V8.7(L)   Intake/Output      02/13 0701 - 02/14 0700   P.O. 120   I.V. 1500   Total Intake 1620   Urine 1500   Blood 300   Total Output 1800   Net -180         Physical Exam: General: NAD.  Supine in bed Resp: No increased wob Cardio: regular rate and rhythm ABD protuberant, soft Neurologically intact MSK Neurovascularly  intact Sensation intact distally Feet warm Dorsiflexion/Plantar flexion intact Incision: dressing C/D/I   Albina BilletHenry Calvin Martensen III, PA-C 08/06/2016, 6:54 AM

## 2016-08-06 NOTE — Care Management Note (Signed)
Case Management Note  Patient Details  Name: Mark [name] MRN: [id number] Date of Birth: [birth year]  Subjective/Objective:   51 yr old male s/p right total hip arthroplasty, anterior approach.                 Action/Plan: Patient was preoperatively setup with [facility], no changes. Has RW and 3in1 at home, will have family support at discharge.    Expected Discharge Date:  08/06/16               Expected Discharge Plan:  Home w Home Health Services  In-House Referral:  NA  Discharge planning Services  CM Consult  Post Acute Care Choice:  Home Health Choice offered to:  Patient  DME Arranged:  N/A DME Agency:     HH Arranged:  PT HH Agency:  [facility]  Status of Service:  Completed, signed off  If discussed at Long Length of Stay Meetings, dates discussed:    Additional Comments:  Durenda GuthrieBrady, Mark Spainhour Naomi, RN 08/06/2016, 2:17 PM

## 2016-08-06 NOTE — Evaluation (Signed)
Occupational Therapy Evaluation Patient Details Name: Mark [name] MRN: [id number] DOB: 02/16/1966 Today's Date: 08/06/2016    History of Present Illness  Mark [name] is an 51 y.o. male who was admitted 08/05/2016 with a diagnosis of Primary osteoarthritis of right hip and went to the operating room on 08/05/2016 and underwent right TH anterior approach.    Clinical Impression   OT education complete regarding ADL activity s/p THA. Pt has all needed DME    Follow Up Recommendations  No OT follow up    Equipment Recommendations  None recommended by OT       Precautions / Restrictions Precautions Precautions: Fall;Anterior Hip Precaution Booklet Issued: Yes (comment) Restrictions Weight Bearing Restrictions: Yes RLE Weight Bearing: Weight bearing as tolerated      Mobility Bed Mobility Overal bed mobility: Needs Assistance Bed Mobility: Sit to Supine     Supine to sit: Supervision        Transfers Overall transfer level: Needs assistance Equipment used: Rolling walker (2 wheeled) Transfers: Sit to/from UGI CorporationStand;Stand Pivot Transfers Sit to Stand: Supervision Stand pivot transfers: Supervision                 ADL Overall ADL's : Needs assistance/impaired     Grooming: Supervision/safety;Standing           Upper Body Dressing : Set up;Sitting   Lower Body Dressing: Minimal assistance;Sit to/from stand;Cueing for safety;Cueing for sequencing Lower Body Dressing Details (indicate cue type and reason): wife will A as needed per pt Toilet Transfer: Supervision/safety;RW;Ambulation;Comfort height toilet;Cueing for sequencing;Cueing for safety   Toileting- Clothing Manipulation and Hygiene: Supervision/safety;Sit to/from stand;Cueing for safety;Cueing for sequencing     Tub/Shower Transfer Details (indicate cue type and reason): verbalized safety Functional mobility during ADLs: Supervision/safety;Rolling walker                 Pertinent  Vitals/Pain Pain Score: 4  Pain Location: right hip Pain Descriptors / Indicators: Sore;Operative site guarding;Grimacing Pain Intervention(s): Monitored during session;Repositioned;Patient requesting pain meds-RN notified     Hand Dominance Right   Extremity/Trunk Assessment Upper Extremity Assessment Upper Extremity Assessment: Overall WFL for tasks assessed           Communication Communication Communication: No difficulties   Cognition Arousal/Alertness: Awake/alert Behavior During Therapy: WFL for tasks assessed/performed Overall Cognitive Status: Within Functional Limits for tasks assessed                                Home Living Family/patient expects to be discharged to:: Private residence Living Arrangements: Spouse/significant other;Children Available Help at Discharge: Family;Available 24 hours/day Type of Home: House Home Access: Stairs to enter Entergy CorporationEntrance Stairs-Number of Steps: 6 Entrance Stairs-Rails: Right;Left;Can reach both Home Layout: One level     Bathroom Shower/Tub: Producer, television/film/videoWalk-in shower   Bathroom Toilet: Standard Bathroom Accessibility: Yes   Home Equipment: Environmental consultantWalker - 2 wheels;Bedside commode;Shower seat;Adaptive equipment;Cane - single point Adaptive Equipment: Reacher        Prior Functioning/Environment Level of Independence: Independent with assistive device(s)        Comments: used cane PTA              OT Goals(Current goals can be found in the care plan section) Acute Rehab OT Goals Patient Stated Goal: to get better OT Goal Formulation: With patient  OT Frequency:                End  of Session Equipment Utilized During Treatment: Engineer, water Communication: Mobility status  Activity Tolerance: Patient tolerated treatment well Patient left: in bed;with call bell/phone within reach   Time: 1120-1154 OT Time Calculation (min): 34 min Charges:  OT General Charges $OT Visit: 1 Procedure OT  Evaluation $OT Eval Moderate Complexity: 1 Procedure OT Treatments $Self Care/Home Management : 8-22 mins G-Codes:    Einar Crow D 08/13/2016, 3:13 PM

## 2016-08-06 NOTE — Evaluation (Signed)
Physical Therapy Evaluation Patient Details Name: Mark [name] MRN: [id number] DOB: [birth year] Today's Date: 08/06/2016   History of Present Illness   MITUL HALLOWELL is an 51 y.o. male who was admitted 08/05/2016 with a diagnosis of Primary osteoarthritis of right hip and went to the operating room on 08/05/2016 and underwent right TH anterior approach.   Clinical Impression  Pt admitted with above diagnosis. Pt currently with functional limitations due to the deficits listed below (see PT Problem List). Pt was able to ambulate with min guard assist and cues.  Pt with pain limiting him more than anything else.  Will follow acutely.  Pt will benefit from skilled PT to increase their independence and safety with mobility to allow discharge to the venue listed below.      Follow Up Recommendations Home health PT;Supervision/Assistance - 24 hour    Equipment Recommendations  None recommended by PT    Recommendations for Other Services       Precautions / Restrictions Precautions Precautions: Fall;Anterior Hip Precaution Booklet Issued: Yes (comment) Restrictions Weight Bearing Restrictions: Yes RLE Weight Bearing: Weight bearing as tolerated      Mobility  Bed Mobility Overal bed mobility: Needs Assistance Bed Mobility: Supine to Sit     Supine to sit: Min assist     General bed mobility comments: Pt needed min assist to bring right LE off bed and follow precautions.  Some pain with movement limited pt speed.  Transfers Overall transfer level: Needs assistance Equipment used: Rolling walker (2 wheeled) Transfers: Sit to/from Stand Sit to Stand: Min guard         General transfer comment: cues for hand placement only.   Ambulation/Gait Ambulation/Gait assistance: Min guard;Min assist Ambulation Distance (Feet): 15 Feet Assistive device: Rolling walker (2 wheeled) Gait Pattern/deviations: Step-to pattern;Decreased step length - right;Decreased stride length;Decreased  stance time - right;Decreased weight shift to right;Antalgic   Gait velocity interpretation: Below normal speed for age/gender General Gait Details: Pt needed cues to sequence steps and RW.  Pt with pain in right LE at times.  Needed assist to steer RW as well.  Limited distance due to pain.  Stairs            Wheelchair Mobility    Modified Rankin (Stroke Patients Only)       Balance Overall balance assessment: Needs assistance Sitting-balance support: No upper extremity supported;Feet supported Sitting balance-Leahy Scale: Fair     Standing balance support: Bilateral upper extremity supported;During functional activity Standing balance-Leahy Scale: Poor Standing balance comment: relies on UEs for support                             Pertinent Vitals/Pain Pain Assessment: 0-10 Pain Score: 6  Pain Location: right hip Pain Descriptors / Indicators: Aching;Sore;Operative site guarding;Grimacing;Guarding Pain Intervention(s): Limited activity within patient's tolerance;Monitored during session;Premedicated before session;Repositioned  VSS with sats >95% on RA.  Home Living Family/patient expects to be discharged to:: Private residence Living Arrangements: Spouse/significant other;Children Available Help at Discharge: Family;Available 24 hours/day Type of Home: House Home Access: Stairs to enter Entrance Stairs-Rails: Right;Left;Can reach both Entrance Stairs-Number of Steps: 6 Home Layout: One level Home Equipment: Walker - 2 wheels;Bedside commode;Shower seat;Adaptive equipment;Cane - single point      Prior Function Level of Independence: Independent with assistive device(s)         Comments: used cane PTA     Hand Dominance   Dominant Hand:  Right    Extremity/Trunk Assessment   Upper Extremity Assessment Upper Extremity Assessment: Defer to OT evaluation    Lower Extremity Assessment Lower Extremity Assessment: RLE deficits/detail RLE  Deficits / Details: grossly 3-/5 RLE: Unable to fully assess due to pain    Cervical / Trunk Assessment Cervical / Trunk Assessment: Normal  Communication   Communication: No difficulties  Cognition Arousal/Alertness: Awake/alert Behavior During Therapy: WFL for tasks assessed/performed Overall Cognitive Status: Within Functional Limits for tasks assessed                      General Comments General comments (skin integrity, edema, etc.): TED hose tight under knee on left LE with indention.  Pt c/o pain.  Took TED hose off and notified nursing.  Nurse to get larger size.      Exercises Total Joint Exercises Ankle Circles/Pumps: AROM;Both;10 reps;Supine Quad Sets: AROM;Both;10 reps;Supine Short Arc Quad: AROM;Right;10 reps;Supine Heel Slides: AAROM;Right;10 reps;Supine   Assessment/Plan    PT Assessment Patient needs continued PT services  PT Problem List Decreased activity tolerance;Decreased balance;Decreased strength;Decreased mobility;Decreased knowledge of use of DME;Decreased safety awareness;Decreased knowledge of precautions;Pain          PT Treatment Interventions DME instruction;Gait training;Stair training;Functional mobility training;Therapeutic activities;Therapeutic exercise;Balance training;Patient/family education    PT Goals (Current goals can be found in the Care Plan section)  Acute Rehab PT Goals Patient Stated Goal: to get better PT Goal Formulation: With patient Time For Goal Achievement: 08/13/16 Potential to Achieve Goals: Good    Frequency 7X/week   Barriers to discharge        Co-evaluation               End of Session Equipment Utilized During Treatment: Gait belt Activity Tolerance: Patient limited by fatigue;Patient limited by pain Patient left: in chair;with call bell/phone within reach Nurse Communication: Mobility status         Time: [id number] PT Time Calculation (min) (ACUTE ONLY): 32 min   Charges:   PT  Evaluation $PT Eval Moderate Complexity: 1 Procedure PT Treatments $Gait Training: 8-22 mins   PT G Codes:        Berline LopesDawn F Juanice Warburton 08/06/2016, 10:47 AM Eber Jonesawn Zoe Nordin,PT Acute Rehabilitation [phone] (pager)

## 2016-08-06 NOTE — Discharge Summary (Addendum)
Discharge Summary  Patient ID: Mark [name] MRN: [id number] DOB/AGE: [birth year] 51 y.o.  Admit date: 08/05/2016 Discharge date: 08/07/2016  Admission Diagnoses:  Primary osteoarthritis of right hip  Discharge Diagnoses:  Principal Problem:   Primary osteoarthritis of right hip Active Problems:   Hypertension   Hyperlipemia   Osteoarthritis   Chronic pain   Sleep apnea   History of gastric bypass   Past Medical History:  Diagnosis Date  . Anemia    low iron  . Anxiety   . Astigmatism of left eye   . Depression   . Fatigue   . GERD (gastroesophageal reflux disease)    heartburn occ - better since having gastric bypass  . Headache    migraines - none for years, sinus headaches at times  . Hyperlipemia   . Knee pain   . Obesity, unspecified   . Osteoarthritis   . Pneumonia    as child   . Shoulder pain   . Trigger finger (acquired)   . Unspecified sleep apnea    uses cpap    Surgeries: Procedure(s): TOTAL HIP ARTHROPLASTY ANTERIOR APPROACH on 08/05/2016   Consultants (if any):   Discharged Condition: Improved  Hospital Course: SHANT HENCE is an 51 y.o. male who was admitted 08/05/2016 with a diagnosis of Primary osteoarthritis of right hip and went to the operating room on 08/05/2016 and underwent the above named procedures.    He was given perioperative antibiotics:  Anti-infectives    Start     Dose/Rate Route Frequency Ordered Stop   08/05/16 2200  vancomycin (VANCOCIN) IVPB 1000 mg/200 mL premix     1,000 mg 200 mL/hr over 60 Minutes Intravenous Every 12 hours 08/05/16 1926 08/05/16 2216   08/05/16 1145  vancomycin (VANCOCIN) 1,500 mg in sodium chloride 0.9 % 500 mL IVPB     1,500 mg 250 mL/hr over 120 Minutes Intravenous To Short Stay 08/04/16 0957 08/05/16 1456    .  He was given sequential compression devices, early ambulation, and Xarelto for DVT prophylaxis.  He benefited maximally from the hospital stay and there were no complications.     Recent vital signs:  Vitals:   08/06/16 2245 08/06/16 2308  BP: 127/81   Pulse: 86   Resp: 18 18  Temp: 98.9 F (37.2 C)     Recent laboratory studies:  Lab Results  Component Value Date   HGB 11.9 (L) 07/29/2016   HGB 12.1 (L) 06/27/2016   HGB 11.8 (L) 12/19/2015   Lab Results  Component Value Date   WBC 7.7 07/29/2016   PLT 291 07/29/2016   Lab Results  Component Value Date   INR 1.02 06/27/2016   Lab Results  Component Value Date   NA 137 07/29/2016   K 4.3 07/29/2016   CL 100 (L) 07/29/2016   CO2 28 07/29/2016   BUN 7 07/29/2016   CREATININE 0.90 07/29/2016   GLUCOSE 111 (H) 07/29/2016    Discharge Medications:   Allergies as of 08/07/2016      Reactions   Penicillins Hives   Has patient had a PCN reaction causing immediate rash, facial/tongue/throat swelling, SOB or lightheadedness with hypotension: Yes Has patient had a PCN reaction causing severe rash involving mucus membranes or skin necrosis: No Has patient had a PCN reaction that required hospitalization unknown "probably not" Has patient had a PCN reaction occurring within the last 10 years: No If all of the above answers are "NO", then may proceed with Cephalosporin use.  Asa [aspirin] Other (See Comments)   gastric bypass   Ibuprofen Other (See Comments)   gastric bypass   Morphine And Related Nausea And Vomiting, Other (See Comments)   headaches   Adhesive [tape] Rash   Please use paper tape, can use cloth tape States plastic bandaids irritate      Medication List    TAKE these medications   acetaminophen 500 MG tablet Commonly known as:  TYLENOL Take 1,000 mg by mouth every 8 (eight) hours as needed for headache.   buPROPion 150 MG 12 hr tablet Commonly known as:  WELLBUTRIN SR Take 150 mg by mouth 2 (two) times daily.   diclofenac sodium 1 % Gel Commonly known as:  VOLTAREN Apply 1 application topically 4 (four) times daily as needed (pain).   docusate sodium 100 MG  capsule Commonly known as:  COLACE Take 1 capsule (100 mg total) by mouth 2 (two) times daily. To prevent constipation while taking pain medication.   DULoxetine 30 MG capsule Commonly known as:  CYMBALTA Take 30 mg by mouth See admin instructions. Take with a 60 mg capsule for a 90 mg dose   DULoxetine 60 MG capsule Commonly known as:  CYMBALTA Take 60 mg by mouth See admin instructions. Take with a 30 mg capsule for a 90 mg dose   ferrous sulfate 325 (65 FE) MG tablet Take 325 mg by mouth daily with breakfast.   FUSION PLUS Caps Take 1 capsule by mouth daily.   gabapentin 100 MG capsule Commonly known as:  NEURONTIN Take 100 mg by mouth 3 (three) times daily.   guaiFENesin 600 MG 12 hr tablet Commonly known as:  MUCINEX Take 600 mg by mouth 2 (two) times daily as needed for cough or to loosen phlegm.   HYDROmorphone 2 MG tablet Commonly known as:  DILAUDID Take 1 tablet (2 mg total) by mouth 2 (two) times daily as needed for severe pain (For Breakthrough pain only).   lamoTRIgine 100 MG tablet Commonly known as:  LAMICTAL Take 100 mg by mouth daily.   levocetirizine 5 MG tablet Commonly known as:  XYZAL Take 5 mg by mouth every evening.   methocarbamol 750 MG tablet Commonly known as:  ROBAXIN Take 750 mg by mouth 4 (four) times daily as needed for muscle spasms.   ondansetron 4 MG tablet Commonly known as:  ZOFRAN Take 1 tablet (4 mg total) by mouth every 8 (eight) hours as needed for nausea or vomiting.   oxyCODONE 40 mg 12 hr tablet Commonly known as:  OXYCONTIN Take 40 mg by mouth 2 (two) times daily.   oxycodone 30 MG immediate release tablet Commonly known as:  ROXICODONE Take 30 mg by mouth every 4 (four) hours as needed for pain.   oxymetazoline 0.05 % nasal spray Commonly known as:  AFRIN Place 1 spray into both nostrils 4 (four) times daily as needed (nasal congestion).   PROAIR HFA 108 (90 Base) MCG/ACT inhaler Generic drug:  albuterol Inhale  1-2 puffs into the lungs every 4 (four) hours as needed for wheezing or shortness of breath.   rivaroxaban 10 MG Tabs tablet Commonly known as:  XARELTO Take 1 tablet (10 mg total) by mouth daily. For 30 days for DVT prophylaxis   tamsulosin 0.4 MG Caps capsule Commonly known as:  FLOMAX Take 0.4 mg by mouth every evening.   tetrahydrozoline 0.05 % ophthalmic solution Place 1-2 drops into both eyes daily as needed (for dry/irritated eyes).  Diagnostic Studies: Dg C-arm 1-60 Min  Result Date: 08/05/2016 CLINICAL DATA:  Right total hip replacement EXAM: DG C-ARM 61-120 MIN; OPERATIVE RIGHT HIP WITH PELVIS COMPARISON:  None. FINDINGS: Changes of right hip replacement. Normal AP alignment. No hardware or bony complicating feature IMPRESSION: Right hip replacement without visible complicating feature. Electronically Signed   By: Charlett NoseKevin  Dover M.D.   On: 08/05/2016 16:38   Dg Hip Operative Unilat W Or W/o Pelvis Right  Result Date: 08/05/2016 CLINICAL DATA:  Right total hip replacement EXAM: DG C-ARM 61-120 MIN; OPERATIVE RIGHT HIP WITH PELVIS COMPARISON:  None. FINDINGS: Changes of right hip replacement. Normal AP alignment. No hardware or bony complicating feature IMPRESSION: Right hip replacement without visible complicating feature. Electronically Signed   By: Charlett NoseKevin  Dover M.D.   On: 08/05/2016 16:38    Disposition: 01-Home or Self Care  Discharge Instructions    Discharge patient    Complete by:  As directed    Discharge disposition:  01-Home or Self Care   Discharge patient date:  08/07/2016      Follow-up Information    MURPHY, Jewel BaizeIMOTHY D, MD Follow up.   Specialty:  Orthopedic Surgery Contact information: [address] [place] [id number] [phone]        [facility] Follow [facility].   Specialty:  Home Health Services Why:  Someone [facility] will contact you to arrange start date and time for therapy. Contact information: [address] [place] [id number] [phone]           Signed: Albina BilletHenry Calvin Martensen III PA-C 08/07/2016, 6:25 AM

## 2016-08-06 NOTE — Progress Notes (Signed)
08/06/16 1512  PT Visit Information  Last PT Received On 08/06/16  Assistance Needed +1  History of Present Illness Mark [name] is an 51 y.o. male who was admitted 08/05/2016 with a diagnosis of Primary osteoarthritis of right hip and went to the operating room on 08/05/2016 and underwent right TH anterior approach.   Subjective Data  Subjective "I want to go home."  Patient Stated Goal to get better  Precautions  Precautions Fall;Anterior Hip  Precaution Booklet Issued Yes (comment)  Restrictions  Weight Bearing Restrictions Yes  RLE Weight Bearing WBAT  Pain Assessment  Pain Assessment Faces  Faces Pain Scale 6  Pain Location right hip and low back  Pain Descriptors / Indicators Sore;Operative site guarding;Grimacing;Aching;Guarding  Pain Intervention(s) Limited activity within patient's tolerance;Monitored during session;Premedicated before session;Repositioned;Patient requesting pain meds-RN notified;RN gave pain meds during session;Ice applied  Cognition  Arousal/Alertness Awake/alert  Behavior During Therapy WFL for tasks assessed/performed  Overall Cognitive Status Within Functional Limits for tasks assessed  Bed Mobility  Overal bed mobility Needs Assistance  Bed Mobility Supine to Sit  Supine to sit Supervision  General bed mobility comments Pt able to use left LE under right LE to bring LEs off bed.    Transfers  Overall transfer level Needs assistance  Equipment used Rolling walker (2 wheeled)  Transfers Sit to/from Stand  Sit to Stand Min assist;Supervision  General transfer comment Pt given cues to slide LE out for comfort with sit to stand to sit.  Otherwise, pt sit to stand from elevated surface with supervision however from lower toilet with grab bar needed min assist to stand.  Pt has 3N1 at home.    Ambulation/Gait  Ambulation/Gait assistance Supervision  Ambulation Distance (Feet) 220 Feet  Assistive device Rolling walker (2 wheeled)  Gait  Pattern/deviations Step-to pattern;Decreased step length - right;Decreased stride length;Decreased stance time - right;Decreased weight shift to right;Antalgic  General Gait Details Pt needed cues to sequence steps and RW.  Pt with pain in right LE at times as well as low back pain per pt report this pm.  Overall, good safety awareness with RW.    Gait velocity interpretation Below normal speed for age/gender  Stairs Yes  Stairs assistance Supervision  Stair Management One rail Left;One rail Right;Step to pattern;Sideways  Number of Stairs 10  General stair comments Pt went up and down stairs sideways as he had learned after last surgery with correct technique (up with good, down with bad) using one rail as pt cannot reach both rails at the same time.  Given pts' hip precautions, cautioned pt to ensure that he does not abduct LE keeping LEs very close together when ascending and descending steps.    Balance  Overall balance assessment Needs assistance  Sitting-balance support No upper extremity supported;Feet supported  Sitting balance-Leahy Scale Fair  Standing balance support Bilateral upper extremity supported;During functional activity  Standing balance-Leahy Scale Fair  Standing balance comment Pt able to stand and pull up pants as well as put on shirt maintaining balance in standing without UE support for up to 20 seconds.  Washed hands at sink as well.    High level balance activites Direction changes;Turns;Sudden stops  High Level Balance Comments supervision for above.   General Comments  General comments (skin integrity, edema, etc.) Wife present during session.  Wife educated on above as well as discussed car transfer technique with pt and wife.  Wife pulled staff including nurse aside and expressed concern regarding pt  taking too much pain medicine at home and she wanted pt to stay in hospital through tomorrow.  This PT and nurse explained to wife that pt does well with mobility  and  that pt will need to self monitor his medication administration.  Wife very worried therefore this PT, nurse and wife had conversation with pt regarding medication concern.  Nurse to discuss further with MD.     Exercises  Exercises Total Joint  Total Joint Exercises  Heel Slides Right;10 reps;Supine;AROM  Long Arc Quad AROM;Right;10 reps;Seated  Marching in Standing AROM;Right;10 reps;Standing  PT - End of Session  Equipment Utilized During Treatment Gait belt  Activity Tolerance Patient tolerated treatment well  Patient left with call bell/phone within reach;in bed;with family/visitor present;with nursing/sitter in room  Nurse Communication Mobility status  PT - Assessment/Plan  PT Plan Current plan remains appropriate  PT Frequency (ACUTE ONLY) 7X/week  Follow Up Recommendations Home health PT;Supervision/Assistance - 24 hour  PT equipment None recommended by PT  PT Goal Progression  Progress towards PT goals Progressing toward goals  PT Time Calculation  PT Start Time (ACUTE ONLY) 1426  PT Stop Time (ACUTE ONLY) 1506  PT Time Calculation (min) (ACUTE ONLY) 40 min  PT General Charges  $$ ACUTE PT VISIT 1 Procedure  PT Treatments  $Gait Training 8-22 mins  $Therapeutic Exercise 8-22 mins  $Self Care/Home Management 8-22  Pt able to ambulate into bathroom and was able to clean himself.  Only needed assist off low toilet and has 3N1 at home. Pt supervision of all mobility.  Pt and wife educated and verbalize confidence in mobility with wife's concerns about medications expressed to nurse.  Nurse to f/u with MD and to determine if d/c will be today or tomorrow.  Wife to provide 24 hour supervision. Thanks. [facility] Jaidan Prevette,PT Acute Rehabilitation [phone] (pager)

## 2016-08-06 NOTE — Discharge Instructions (Addendum)

## 2016-08-07 ENCOUNTER — Encounter (HOSPITAL_COMMUNITY): Payer: Self-pay

## 2016-08-07 MED ORDER — HYDROMORPHONE HCL 2 MG PO TABS: 2.0000 mg | ORAL_TABLET | Freq: Two times a day (BID) | ORAL | 0 refills | Status: DC | PRN

## 2016-08-07 MED ORDER — ONDANSETRON HCL 4 MG PO TABS: 4.0000 mg | ORAL_TABLET | Freq: Three times a day (TID) | ORAL | 0 refills | Status: DC | PRN

## 2016-08-07 MED ORDER — DOCUSATE SODIUM 100 MG PO CAPS: 100.0000 mg | ORAL_CAPSULE | Freq: Two times a day (BID) | ORAL | 0 refills | Status: DC

## 2016-08-07 MED ORDER — RIVAROXABAN 10 MG PO TABS: 10.0000 mg | ORAL_TABLET | Freq: Every day | ORAL | 0 refills | Status: DC

## 2016-08-07 NOTE — Progress Notes (Signed)
Physical Therapy Treatment Patient Details Name: Mark BankerJames R [name] MRN: [id number] DOB: Oct 28, 1965 Today's Date: 08/07/2016    History of Present Illness  Mark [name] is an 51 y.o. male who was admitted 08/05/2016 with a diagnosis of Primary osteoarthritis of right hip and went to the operating room on 08/05/2016 and underwent right TH anterior approach.     PT Comments    POD # 2 Pt amb self around room on arrival.  Assisted with amb a greater distance in hallway.  Tolerated well.  Returned to room then performed all supine THR TE's followed by ICE.  Pt eager to D/C to home.    Follow Up Recommendations  Home health PT;Supervision/Assistance - 24 hour     Equipment Recommendations  None recommended by PT    Recommendations for Other Services       Precautions / Restrictions Precautions Precautions: Fall;Anterior Hip Restrictions Weight Bearing Restrictions: No RLE Weight Bearing: Weight bearing as tolerated    Mobility  Bed Mobility   Bed Mobility: Supine to Sit;Sit to Supine           General bed mobility comments: Pt able to use left LE under right LE to bring LEs off bed.    Transfers Overall transfer level: Needs assistance Equipment used: Rolling walker (2 wheeled) Transfers: Sit to/from Stand Sit to Stand: Supervision Stand pivot transfers: Supervision       General transfer comment: good use of hands to steady self.  Increased time.  Improving  Ambulation/Gait Ambulation/Gait assistance: Supervision Ambulation Distance (Feet): 185 Feet Assistive device: Rolling walker (2 wheeled) Gait Pattern/deviations: Step-to pattern;Decreased step length - right;Decreased stride length;Decreased stance time - right;Decreased weight shift to right;Antalgic     General Gait Details: back pain still present but "not as bad".  Tolerated amb well.  Knowlegable from previous surgeries.   Stairs Stairs:  (performed stairs yesterday and pt did not see a need to  repeat.  Verbally stated proper sequence. )          Wheelchair Mobility    Modified Rankin (Stroke Patients Only)       Balance                                    Cognition Arousal/Alertness: Awake/alert Behavior During Therapy: WFL for tasks assessed/performed Overall Cognitive Status: Within Functional Limits for tasks assessed                      Exercises      General Comments        Pertinent Vitals/Pain Pain Assessment: 0-10 Pain Score: 5  Pain Location: right hip and low back Pain Descriptors / Indicators: Sore;Operative site guarding;Grimacing;Aching;Guarding Pain Intervention(s): Monitored during session;Repositioned;Ice applied    Home Living                      Prior Function            PT Goals (current goals can now be found in the care plan section) Progress towards PT goals: Progressing toward goals    Frequency    7X/week      PT Plan Current plan remains appropriate    Co-evaluation             End of Session Equipment Utilized During Treatment: Gait belt Activity Tolerance: Patient tolerated treatment well Patient left: in bed;with call bell/phone  within reach     Time: 0935-0959 PT Time Calculation (min) (ACUTE ONLY): 24 min  Charges:  $Gait Training: 8-22 mins $Therapeutic Exercise: 8-22 mins                    G Codes:      Felecia Shelling  PTA [facility]  Acute  Rehab Pager      [phone]

## 2016-08-07 NOTE — Progress Notes (Addendum)
No significant clinical change since yesterday.  Feeling well.  Afebrile.  Urinating, Ambulating and cleared by PT.   Re-discussed need plan to Continue home medications for pain and muscle spasm as discussed prior to surgery.  Additional pain medication prescribed postop for breakthrough pain only. Take as prescribed.  Albina BilletHenry Calvin Martensen III, PA-C 08/07/2016 7:17 AM

## 2016-08-07 NOTE — Progress Notes (Signed)
Review discharge papers and medications with full understanding 

## 2016-11-10 ENCOUNTER — Other Ambulatory Visit (HOSPITAL_COMMUNITY): Payer: Self-pay

## 2016-11-26 ENCOUNTER — Ambulatory Visit: Payer: Self-pay | Admitting: Nurse Practitioner

## 2016-11-27 ENCOUNTER — Ambulatory Visit: Payer: Self-pay | Admitting: Physician Assistant

## 2016-11-27 NOTE — H&P (Signed)
TOTAL KNEE ADMISSION H&P  Patient is being admitted for right total knee arthroplasty.  Subjective:  Chief Complaint:right knee pain.  HPI: Mark [name], 51 y.o. male, has a history of pain and functional disability in the right knee due to arthritis and has failed non-surgical conservative treatments for greater than 12 weeks to includeNSAID's and/or analgesics, corticosteriod injections, viscosupplementation injections, use of assistive devices, weight reduction as appropriate and activity modification.  Onset of symptoms was gradual, starting >10 years ago with gradually worsening course since that time. The patient noted prior procedures on the knee to include  menisectomy on the right knee(s).  Patient currently rates pain in the right knee(s) at 10 out of 10 with activity. Patient has night pain, worsening of pain with activity and weight bearing, pain that interferes with activities of daily living, pain with passive range of motion, crepitus and joint swelling.  Patient has evidence of periarticular osteophytes and joint space narrowing by imaging studies. There is no active infection.  Patient Active Problem List   Diagnosis Date Noted  . Primary osteoarthritis of right hip 06/09/2016  . History of gastric bypass 06/09/2016  . Encounter for screening colonoscopy 11/26/2015  . Primary localized osteoarthritis of left knee 07/27/2015  . Hypertension 06/14/2014  . Hyperlipemia 06/14/2014  . Osteoarthritis 06/14/2014  . Chronic pain 06/14/2014  . Sleep apnea 06/14/2014  . Pain in the chest 06/14/2014   Past Medical History:  Diagnosis Date  . Anemia    low iron  . Anxiety   . Astigmatism of left eye   . Depression   . Fatigue   . GERD (gastroesophageal reflux disease)    heartburn occ - better since having gastric bypass  . Headache    migraines - none for years, sinus headaches at times  . Hyperlipemia   . Knee pain   . Obesity, unspecified   . Osteoarthritis   .  Pneumonia    as child   . Shoulder pain   . Trigger finger (acquired)   . Unspecified sleep apnea    uses cpap    Past Surgical History:  Procedure Laterality Date  . CARPAL TUNNEL RELEASE Bilateral   . FRACTURE SURGERY     all fingers expect l thumb  . GASTRIC BYPASS  2011  . JOINT REPLACEMENT    . KNEE ARTHROSCOPY Right   . KNEE ARTHROSCOPY Left 11/15/14  . SHOULDER SURGERY Left 2006  . SHOULDER SURGERY Right 2012 and 2017  . TOTAL HIP ARTHROPLASTY Right 08/05/2016   Procedure: TOTAL HIP ARTHROPLASTY ANTERIOR APPROACH;  Surgeon: Timothy D Murphy, MD;  Location: [facility] OR;  Service: Orthopedics;  Laterality: Right;  . TOTAL KNEE ARTHROPLASTY Left 07/27/2015   Procedure: LEFT TOTAL KNEE ARTHROPLASTY;  Surgeon: Daniel Caffrey, MD;  Location: [facility] OR;  Service: Orthopedics;  Laterality: Left;     (Not in a hospital admission) Allergies  Allergen Reactions  . Penicillins Hives    Has patient had a PCN reaction causing immediate rash, facial/tongue/throat swelling, SOB or lightheadedness with hypotension: Yes Has patient had a PCN reaction causing severe rash involving mucus membranes or skin necrosis: No Has patient had a PCN reaction that required hospitalization unknown "probably not" Has patient had a PCN reaction occurring within the last 10 years: No If all of the above answers are "NO", then may proceed with Cephalosporin use.  . Asa [Aspirin] Other (See Comments)    gastric bypass   . Ibuprofen Other (See Comments)      gastric bypass  . Morphine And Related Nausea And Vomiting and Other (See Comments)    headaches  . Adhesive [Tape] Rash    Please use paper tape, can use cloth tape  States plastic bandaids irritate    Social History  Substance Use Topics  . Smoking status: Never Smoker  . Smokeless tobacco: Former User    Types: Chew    Quit date: 07/15/1994  . Alcohol use 0.0 oz/week     Comment: One every 1-2 months    Family History  Problem Relation Age of Onset  .  Heart failure Mother   . Diabetes Mother   . COPD Mother   . Cancer Father   . Arthritis Father   . Colon cancer Neg Hx      Review of Systems  Gastrointestinal: Positive for nausea and vomiting.  Genitourinary: Positive for frequency.  Musculoskeletal: Positive for back pain and joint pain.  Neurological: Positive for headaches.  All other systems reviewed and are negative.   Objective:  Physical Exam  Constitutional: He is oriented to person, place, and time. He appears well-developed and well-nourished. No distress.  HENT:  Head: Normocephalic and atraumatic.  Nose: Nose normal.  Eyes: EOM are normal.  Neck: Normal range of motion. Neck supple.  Cardiovascular: Normal rate, regular rhythm, normal heart sounds and intact distal pulses.   Respiratory: Breath sounds normal. No respiratory distress.  GI: Soft. Bowel sounds are normal. He exhibits no distension. There is no tenderness.  Musculoskeletal:       Right knee: He exhibits swelling. He exhibits normal range of motion. Tenderness found.  Neurological: He is alert and oriented to person, place, and time. No cranial nerve deficit.  Skin: Skin is warm. No rash noted. No erythema.  Psychiatric: He has a normal mood and affect. His behavior is normal.    Vital signs in last 24 hours: @VSRANGES@  Labs:   Estimated body mass index is 41.19 kg/m as calculated from the following:   Height as of 07/29/16: 5' 11" (1.803 m).   Weight as of 07/29/16: 133.9 kg (295 lb 4.8 oz).   Imaging Review Plain radiographs demonstrate severe degenerative joint disease of the right knee(s). The overall alignment issignificant valgus. The bone quality appears to be good for age and reported activity level.  Assessment/Plan:  End stage arthritis, right knee   The patient history, physical examination, clinical judgment of the provider and imaging studies are consistent with end stage degenerative joint disease of the right knee(s) and  total knee arthroplasty is deemed medically necessary. The treatment options including medical management, injection therapy arthroscopy and arthroplasty were discussed at length. The risks and benefits of total knee arthroplasty were presented and reviewed. The risks due to aseptic loosening, infection, stiffness, patella tracking problems, thromboembolic complications and other imponderables were discussed. The patient acknowledged the explanation, agreed to proceed with the plan and consent was signed. Patient is being admitted for inpatient treatment for surgery, pain control, PT, OT, prophylactic antibiotics, VTE prophylaxis, progressive ambulation and ADL's and discharge planning. The patient is planning to be discharged home with home health services  

## 2016-12-02 ENCOUNTER — Ambulatory Visit: Payer: BLUE CROSS/BLUE SHIELD | Admitting: Nurse Practitioner

## 2016-12-08 ENCOUNTER — Encounter (HOSPITAL_COMMUNITY): Payer: Self-pay

## 2016-12-08 ENCOUNTER — Encounter (HOSPITAL_COMMUNITY)
Admission: RE | Admit: 2016-12-08 | Discharge: 2016-12-08 | Disposition: A | Discharge status: Home / Self Care | Payer: BLUE CROSS/BLUE SHIELD | Source: Ambulatory Visit | Attending: Orthopedic Surgery | Admitting: Orthopedic Surgery

## 2016-12-08 DIAGNOSIS — I10 Essential (primary) hypertension: Secondary | ICD-10-CM | POA: Insufficient documentation

## 2016-12-08 DIAGNOSIS — Z01810 Encounter for preprocedural cardiovascular examination: Secondary | ICD-10-CM | POA: Diagnosis present

## 2016-12-08 DIAGNOSIS — M1711 Unilateral primary osteoarthritis, right knee: Secondary | ICD-10-CM | POA: Diagnosis not present

## 2016-12-08 DIAGNOSIS — I498 Other specified cardiac arrhythmias: Secondary | ICD-10-CM | POA: Insufficient documentation

## 2016-12-08 DIAGNOSIS — Z01812 Encounter for preprocedural laboratory examination: Secondary | ICD-10-CM | POA: Diagnosis present

## 2016-12-08 LAB — CBC WITH DIFFERENTIAL/PLATELET
Basophils Absolute: 0.1 10*3/uL (ref 0.0–0.1)
Basophils Relative: 1 %
Eosinophils Absolute: 0.3 10*3/uL (ref 0.0–0.7)
Eosinophils Relative: 4 %
HEMATOCRIT: 37.7 % — AB (ref 39.0–52.0)
HEMOGLOBIN: 12.4 g/dL — AB (ref 13.0–17.0)
Lymphocytes Relative: 28 %
Lymphs Abs: 2.4 10*3/uL (ref 0.7–4.0)
MCH: 29.2 pg (ref 26.0–34.0)
MCHC: 32.9 g/dL (ref 30.0–36.0)
MCV: 88.7 fL (ref 78.0–100.0)
MONO ABS: 0.6 10*3/uL (ref 0.1–1.0)
MONOS PCT: 7 %
NEUTROS ABS: 5.2 10*3/uL (ref 1.7–7.7)
NEUTROS PCT: 60 %
Platelets: 317 10*3/uL (ref 150–400)
RBC: 4.25 MIL/uL (ref 4.22–5.81)
RDW: 13.5 % (ref 11.5–15.5)
WBC: 8.6 10*3/uL (ref 4.0–10.5)

## 2016-12-08 LAB — COMPREHENSIVE METABOLIC PANEL
ALK PHOS: 142 U/L — AB (ref 38–126)
ALT: 11 U/L — ABNORMAL LOW (ref 17–63)
AST: 19 U/L (ref 15–41)
Albumin: 4.2 g/dL (ref 3.5–5.0)
Anion gap: 10 (ref 5–15)
BILIRUBIN TOTAL: 0.3 mg/dL (ref 0.3–1.2)
BUN: 7 mg/dL (ref 6–20)
CO2: 24 mmol/L (ref 22–32)
CREATININE: 0.97 mg/dL (ref 0.61–1.24)
Calcium: 8.5 mg/dL — ABNORMAL LOW (ref 8.9–10.3)
Chloride: 103 mmol/L (ref 101–111)
GFR calc non Af Amer: >60 mL/min (ref >60)
Glucose, Bld: 85 mg/dL (ref 65–99)
POTASSIUM: 3.5 mmol/L (ref 3.5–5.1)
Sodium: 137 mmol/L (ref 135–145)
Total Protein: 7.5 g/dL (ref 6.5–8.1)

## 2016-12-08 LAB — PROTIME-INR
INR: 1.01
Prothrombin Time: 13.3 seconds (ref 11.4–15.2)

## 2016-12-08 LAB — URINALYSIS, ROUTINE W REFLEX MICROSCOPIC
Bacteria, UA: NONE SEEN
Bilirubin Urine: NEGATIVE
Glucose, UA: NEGATIVE mg/dL
HGB URINE DIPSTICK: NEGATIVE
Ketones, ur: 5 mg/dL — AB
LEUKOCYTES UA: NEGATIVE
NITRITE: NEGATIVE
PROTEIN: 30 mg/dL — AB
SPECIFIC GRAVITY, URINE: 1.03 (ref 1.005–1.030)
pH: 5 (ref 5.0–8.0)

## 2016-12-08 LAB — TYPE AND SCREEN
ABO/RH(D): O NEG
ANTIBODY SCREEN: NEGATIVE

## 2016-12-08 LAB — APTT: aPTT: 36 seconds (ref 24–36)

## 2016-12-08 LAB — SURGICAL PCR SCREEN
MRSA, PCR: NEGATIVE
STAPHYLOCOCCUS AUREUS: NEGATIVE

## 2016-12-08 NOTE — Pre-Procedure Instructions (Signed)
    Jennette BankerJames R Mongiello  12/08/2016      [facility] - [place], [place] - [address] [place] [address] Phone: [phone] Fax: [phone]  [facility],  - [place], [place] - [id number] [place] [address] Phone: [phone] Fax: [phone]    Your procedure is scheduled on 12/19/16.  [facility].M.  Call this number if you have problems the morning of surgery:  [phone]   Remember:  Do not eat food or drink liquids after midnight.  Take these medicines the morning of surgery with A SIP OF WATER --tylenol,cymbalta,neurontin,hydrocodone,all inhalers   Do not wear jewelry, make-up or nail polish.  Do not wear lotions, powders, or perfumes, or deoderant.  Do not shave 48 hours prior to surgery.  Men may shave face and neck.  Do not bring valuables to the hospital.  [facility] is not responsible for any belongings or valuables.  Contacts, dentures or bridgework may not be worn into surgery.  Leave your suitcase in the car.  After surgery it may be brought to your room.  For patients admitted to the hospital, discharge time will be determined by your treatment team.  Patients discharged the day of surgery will not be allowed to drive home.   Name and phone number of your driver:    Special instructions:  Do not take any aspirin,anti-inflammatories,vitamins,or herbal supplements 5-7 days prior to surgery.  Please read over the following fact sheets that you were given. MRSA Information

## 2016-12-09 LAB — URINE CULTURE: CULTURE: NO GROWTH

## 2016-12-10 ENCOUNTER — Ambulatory Visit: Payer: BLUE CROSS/BLUE SHIELD | Admitting: Nurse Practitioner

## 2016-12-18 MED ORDER — TRANEXAMIC ACID 1000 MG/10ML IV SOLN
1000.0000 mg | INTRAVENOUS | Status: AC
  Administered 2016-12-19: 1000 mg via INTRAVENOUS
  Filled 2016-12-18: qty 10

## 2016-12-18 MED ORDER — SODIUM CHLORIDE 0.9 % IV SOLN: INTRAVENOUS | Status: DC

## 2016-12-18 MED ORDER — BUPIVACAINE LIPOSOME 1.3 % IJ SUSP
20.0000 mL | INTRAMUSCULAR | Status: AC
  Administered 2016-12-19: 20 mL
  Filled 2016-12-18: qty 20

## 2016-12-18 MED ORDER — VANCOMYCIN HCL 10 G IV SOLR
1500.0000 mg | INTRAVENOUS | Status: AC
  Administered 2016-12-19: 1500 mg via INTRAVENOUS
  Filled 2016-12-18: qty 1500

## 2016-12-18 NOTE — Anesthesia Preprocedure Evaluation (Signed)
Anesthesia Evaluation  Patient identified by MRN, date of birth, ID band Patient awake    Reviewed: Allergy & Precautions, NPO status , Patient's Chart, lab work & pertinent test results  Airway Mallampati: II  TM Distance: >3 FB     Dental   Pulmonary sleep apnea , pneumonia,    breath sounds clear to auscultation       Cardiovascular hypertension,  Rhythm:Regular Rate:Normal     Neuro/Psych  Headaches, PSYCHIATRIC DISORDERS Anxiety Depression    GI/Hepatic Neg liver ROS, GERD  ,  Endo/Other  negative endocrine ROS  Renal/GU negative Renal ROS     Musculoskeletal  (+) Arthritis ,   Abdominal   Peds  Hematology  (+) anemia ,   Anesthesia Other Findings   Reproductive/Obstetrics                             Anesthesia Physical  Anesthesia Plan  ASA: III  Anesthesia Plan: Regional and Spinal   Post-op Pain Management:  Regional for Post-op pain   Induction: Intravenous  PONV Risk Score and Plan: 2 and Ondansetron, Dexamethasone and Propofol  Airway Management Planned:   Additional Equipment:   Intra-op Plan:   Post-operative Plan:   Informed Consent: I have reviewed the patients History and Physical, chart, labs and discussed the procedure including the risks, benefits and alternatives for the proposed anesthesia with the patient or authorized representative who has indicated his/her understanding and acceptance.   Dental advisory given  Plan Discussed with: CRNA  Anesthesia Plan Comments:         Anesthesia Quick Evaluation

## 2016-12-19 ENCOUNTER — Inpatient Hospital Stay (HOSPITAL_COMMUNITY): Payer: BLUE CROSS/BLUE SHIELD | Admitting: Anesthesiology

## 2016-12-19 ENCOUNTER — Encounter (HOSPITAL_COMMUNITY)
Admission: RE | Disposition: A | Discharge status: Home Health | Payer: Self-pay | Source: Ambulatory Visit | Attending: Orthopedic Surgery

## 2016-12-19 ENCOUNTER — Encounter (HOSPITAL_COMMUNITY): Payer: Self-pay | Admitting: *Deleted

## 2016-12-19 ENCOUNTER — Inpatient Hospital Stay (HOSPITAL_COMMUNITY)
Admission: RE | Admit: 2016-12-19 | Discharge: 2016-12-22 | DRG: 470 | Disposition: A | Discharge status: Home Health | Payer: BLUE CROSS/BLUE SHIELD | Source: Ambulatory Visit | Attending: Orthopedic Surgery | Admitting: Orthopedic Surgery

## 2016-12-19 DIAGNOSIS — Z888 Allergy status to other drugs, medicaments and biological substances status: Secondary | ICD-10-CM | POA: Diagnosis not present

## 2016-12-19 DIAGNOSIS — E785 Hyperlipidemia, unspecified: Secondary | ICD-10-CM | POA: Diagnosis present

## 2016-12-19 DIAGNOSIS — Z96652 Presence of left artificial knee joint: Secondary | ICD-10-CM | POA: Diagnosis present

## 2016-12-19 DIAGNOSIS — I10 Essential (primary) hypertension: Secondary | ICD-10-CM | POA: Diagnosis present

## 2016-12-19 DIAGNOSIS — Z6841 Body Mass Index (BMI) 40.0 and over, adult: Secondary | ICD-10-CM | POA: Diagnosis not present

## 2016-12-19 DIAGNOSIS — Z87891 Personal history of nicotine dependence: Secondary | ICD-10-CM

## 2016-12-19 DIAGNOSIS — Z880 Allergy status to penicillin: Secondary | ICD-10-CM | POA: Diagnosis not present

## 2016-12-19 DIAGNOSIS — K219 Gastro-esophageal reflux disease without esophagitis: Secondary | ICD-10-CM | POA: Diagnosis present

## 2016-12-19 DIAGNOSIS — Z96641 Presence of right artificial hip joint: Secondary | ICD-10-CM | POA: Diagnosis present

## 2016-12-19 DIAGNOSIS — D649 Anemia, unspecified: Secondary | ICD-10-CM | POA: Diagnosis present

## 2016-12-19 DIAGNOSIS — F329 Major depressive disorder, single episode, unspecified: Secondary | ICD-10-CM | POA: Diagnosis present

## 2016-12-19 DIAGNOSIS — Z9884 Bariatric surgery status: Secondary | ICD-10-CM

## 2016-12-19 DIAGNOSIS — Z8261 Family history of arthritis: Secondary | ICD-10-CM | POA: Diagnosis not present

## 2016-12-19 DIAGNOSIS — M1711 Unilateral primary osteoarthritis, right knee: Principal | ICD-10-CM | POA: Diagnosis present

## 2016-12-19 DIAGNOSIS — E669 Obesity, unspecified: Secondary | ICD-10-CM | POA: Diagnosis present

## 2016-12-19 DIAGNOSIS — G4730 Sleep apnea, unspecified: Secondary | ICD-10-CM | POA: Diagnosis present

## 2016-12-19 DIAGNOSIS — M2100 Valgus deformity, not elsewhere classified, unspecified site: Secondary | ICD-10-CM | POA: Diagnosis present

## 2016-12-19 DIAGNOSIS — Z885 Allergy status to narcotic agent status: Secondary | ICD-10-CM | POA: Diagnosis not present

## 2016-12-19 DIAGNOSIS — F419 Anxiety disorder, unspecified: Secondary | ICD-10-CM | POA: Diagnosis present

## 2016-12-19 DIAGNOSIS — Z886 Allergy status to analgesic agent status: Secondary | ICD-10-CM

## 2016-12-19 HISTORY — PX: TOTAL KNEE ARTHROPLASTY: SHX125

## 2016-12-19 SURGERY — ARTHROPLASTY, KNEE, TOTAL
Anesthesia: Regional | Site: Knee | Laterality: Right

## 2016-12-19 MED ORDER — OXYCODONE HCL 5 MG PO TABS
30.0000 mg | ORAL_TABLET | ORAL | Status: DC | PRN
  Administered 2016-12-19 – 2016-12-22 (×17): 30 mg via ORAL
  Filled 2016-12-19 (×17): qty 6

## 2016-12-19 MED ORDER — KETOROLAC TROMETHAMINE 30 MG/ML IJ SOLN
INTRAMUSCULAR | Status: AC
  Administered 2016-12-19: 30 mg via INTRAVENOUS
  Filled 2016-12-19: qty 1

## 2016-12-19 MED ORDER — DOCUSATE SODIUM 100 MG PO CAPS
100.0000 mg | ORAL_CAPSULE | Freq: Two times a day (BID) | ORAL | Status: DC
  Administered 2016-12-19 – 2016-12-22 (×6): 100 mg via ORAL
  Filled 2016-12-19 (×6): qty 1

## 2016-12-19 MED ORDER — KETOROLAC TROMETHAMINE 30 MG/ML IJ SOLN
30.0000 mg | Freq: Once | INTRAMUSCULAR | Status: DC | PRN
  Administered 2016-12-19: 30 mg via INTRAVENOUS

## 2016-12-19 MED ORDER — VORTIOXETINE HBR 20 MG PO TABS
20.0000 mg | ORAL_TABLET | Freq: Every day | ORAL | Status: DC
  Administered 2016-12-20 – 2016-12-21 (×2): 20 mg via ORAL
  Filled 2016-12-19 (×3): qty 20

## 2016-12-19 MED ORDER — VANCOMYCIN HCL IN DEXTROSE 1-5 GM/200ML-% IV SOLN
1000.0000 mg | Freq: Two times a day (BID) | INTRAVENOUS | Status: AC
  Administered 2016-12-19: 1000 mg via INTRAVENOUS
  Filled 2016-12-19: qty 200

## 2016-12-19 MED ORDER — CYCLOBENZAPRINE HCL 10 MG PO TABS
5.0000 mg | ORAL_TABLET | Freq: Four times a day (QID) | ORAL | Status: DC | PRN
  Administered 2016-12-19 – 2016-12-22 (×8): 5 mg via ORAL
  Filled 2016-12-19 (×9): qty 1

## 2016-12-19 MED ORDER — PHENOL 1.4 % MT LIQD: 1.0000 | OROMUCOSAL | Status: DC | PRN

## 2016-12-19 MED ORDER — KETAMINE HCL 10 MG/ML IJ SOLN
INTRAMUSCULAR | Status: DC | PRN
  Administered 2016-12-19: 20 mg via INTRAVENOUS
  Administered 2016-12-19: 30 mg via INTRAVENOUS

## 2016-12-19 MED ORDER — MIDAZOLAM HCL 5 MG/5ML IJ SOLN
INTRAMUSCULAR | Status: DC | PRN
  Administered 2016-12-19: 2 mg via INTRAVENOUS

## 2016-12-19 MED ORDER — FENTANYL CITRATE (PF) 100 MCG/2ML IJ SOLN
INTRAMUSCULAR | Status: DC | PRN
  Administered 2016-12-19: 250 ug via INTRAVENOUS

## 2016-12-19 MED ORDER — APIXABAN 2.5 MG PO TABS: 2.5000 mg | ORAL_TABLET | Freq: Two times a day (BID) | ORAL | 0 refills | Status: DC

## 2016-12-19 MED ORDER — MIRABEGRON ER 25 MG PO TB24
25.0000 mg | ORAL_TABLET | Freq: Every day | ORAL | Status: DC
  Administered 2016-12-20 – 2016-12-22 (×3): 25 mg via ORAL
  Filled 2016-12-19 (×3): qty 1

## 2016-12-19 MED ORDER — METOCLOPRAMIDE HCL 5 MG/ML IJ SOLN: 5.0000 mg | Freq: Three times a day (TID) | INTRAMUSCULAR | Status: DC | PRN

## 2016-12-19 MED ORDER — HYDROMORPHONE HCL 1 MG/ML IJ SOLN
0.2500 mg | INTRAMUSCULAR | Status: DC | PRN
  Administered 2016-12-19 (×3): 0.5 mg via INTRAVENOUS

## 2016-12-19 MED ORDER — OXYCODONE HCL 5 MG PO TABS
ORAL_TABLET | ORAL | Status: AC
  Administered 2016-12-19: 30 mg via ORAL
  Filled 2016-12-19: qty 6

## 2016-12-19 MED ORDER — PROPOFOL 1000 MG/100ML IV EMUL
INTRAVENOUS | Status: AC
  Filled 2016-12-19: qty 200

## 2016-12-19 MED ORDER — ONDANSETRON HCL 4 MG PO TABS: 4.0000 mg | ORAL_TABLET | Freq: Three times a day (TID) | ORAL | Status: DC | PRN

## 2016-12-19 MED ORDER — HYDROMORPHONE HCL 1 MG/ML IJ SOLN
INTRAMUSCULAR | Status: AC
  Administered 2016-12-19: 0.5 mg via INTRAVENOUS
  Filled 2016-12-19: qty 0.5

## 2016-12-19 MED ORDER — BUPIVACAINE-EPINEPHRINE (PF) 0.5% -1:200000 IJ SOLN
INTRAMUSCULAR | Status: AC
  Filled 2016-12-19: qty 30

## 2016-12-19 MED ORDER — OXYCODONE HCL ER 20 MG PO T12A
40.0000 mg | EXTENDED_RELEASE_TABLET | Freq: Two times a day (BID) | ORAL | Status: DC
  Administered 2016-12-19 – 2016-12-22 (×7): 40 mg via ORAL
  Filled 2016-12-19 (×7): qty 2

## 2016-12-19 MED ORDER — APIXABAN 2.5 MG PO TABS
2.5000 mg | ORAL_TABLET | Freq: Two times a day (BID) | ORAL | Status: DC
  Administered 2016-12-20 – 2016-12-22 (×5): 2.5 mg via ORAL
  Filled 2016-12-19 (×5): qty 1

## 2016-12-19 MED ORDER — PROPOFOL 10 MG/ML IV BOLUS
INTRAVENOUS | Status: DC | PRN
  Administered 2016-12-19: 50 mg via INTRAVENOUS

## 2016-12-19 MED ORDER — CHLORHEXIDINE GLUCONATE 4 % EX LIQD: 60.0000 mL | Freq: Once | CUTANEOUS | Status: DC

## 2016-12-19 MED ORDER — ACETAMINOPHEN 500 MG PO TABS
1000.0000 mg | ORAL_TABLET | Freq: Three times a day (TID) | ORAL | Status: DC | PRN
  Administered 2016-12-20 – 2016-12-22 (×4): 1000 mg via ORAL
  Filled 2016-12-19 (×4): qty 2

## 2016-12-19 MED ORDER — FENTANYL CITRATE (PF) 250 MCG/5ML IJ SOLN
INTRAMUSCULAR | Status: AC
  Filled 2016-12-19: qty 5

## 2016-12-19 MED ORDER — SODIUM CHLORIDE 0.9 % IR SOLN
Status: DC | PRN
  Administered 2016-12-19: 3000 mL

## 2016-12-19 MED ORDER — BUPIVACAINE-EPINEPHRINE (PF) 0.25% -1:200000 IJ SOLN
INTRAMUSCULAR | Status: AC
  Filled 2016-12-19: qty 30

## 2016-12-19 MED ORDER — OXYMETAZOLINE HCL 0.05 % NA SOLN
1.0000 | Freq: Four times a day (QID) | NASAL | Status: DC | PRN
  Filled 2016-12-19: qty 15

## 2016-12-19 MED ORDER — HYDROMORPHONE HCL 1 MG/ML IJ SOLN
INTRAMUSCULAR | Status: AC
  Filled 2016-12-19: qty 0.5

## 2016-12-19 MED ORDER — HYDROMORPHONE HCL 1 MG/ML IJ SOLN
2.0000 mg | INTRAMUSCULAR | Status: DC | PRN
  Administered 2016-12-19 – 2016-12-21 (×13): 2 mg via INTRAVENOUS
  Filled 2016-12-19 (×13): qty 2

## 2016-12-19 MED ORDER — METOCLOPRAMIDE HCL 5 MG PO TABS: 5.0000 mg | ORAL_TABLET | Freq: Three times a day (TID) | ORAL | Status: DC | PRN

## 2016-12-19 MED ORDER — MIDAZOLAM HCL 2 MG/2ML IJ SOLN
INTRAMUSCULAR | Status: AC
  Filled 2016-12-19: qty 2

## 2016-12-19 MED ORDER — MEPERIDINE HCL 25 MG/ML IJ SOLN: 6.2500 mg | INTRAMUSCULAR | Status: DC | PRN

## 2016-12-19 MED ORDER — BUPIVACAINE-EPINEPHRINE (PF) 0.25% -1:200000 IJ SOLN
INTRAMUSCULAR | Status: DC | PRN
  Administered 2016-12-19: 50 mL

## 2016-12-19 MED ORDER — OXYCODONE HCL ER 40 MG PO T12A
40.0000 mg | EXTENDED_RELEASE_TABLET | Freq: Two times a day (BID) | ORAL | 0 refills | Status: DC
Start: 2016-12-19 — End: 2020-03-20

## 2016-12-19 MED ORDER — PROMETHAZINE HCL 25 MG/ML IJ SOLN: 6.2500 mg | INTRAMUSCULAR | Status: DC | PRN

## 2016-12-19 MED ORDER — SODIUM CHLORIDE 0.9 % IV SOLN
INTRAVENOUS | Status: DC | PRN
  Administered 2016-12-19: 5 ug/kg/min via INTRAVENOUS

## 2016-12-19 MED ORDER — MENTHOL 3 MG MT LOZG: 1.0000 | LOZENGE | OROMUCOSAL | Status: DC | PRN

## 2016-12-19 MED ORDER — OXYCODONE HCL 5 MG PO TABS: 5.0000 mg | ORAL_TABLET | Freq: Once | ORAL | Status: DC | PRN

## 2016-12-19 MED ORDER — ALBUTEROL SULFATE (2.5 MG/3ML) 0.083% IN NEBU: 2.5000 mg | INHALATION_SOLUTION | RESPIRATORY_TRACT | Status: DC | PRN

## 2016-12-19 MED ORDER — PROPOFOL 500 MG/50ML IV EMUL
INTRAVENOUS | Status: DC | PRN
  Administered 2016-12-19: 125 ug/kg/min via INTRAVENOUS
  Administered 2016-12-19 (×2): via INTRAVENOUS

## 2016-12-19 MED ORDER — GABAPENTIN 100 MG PO CAPS
100.0000 mg | ORAL_CAPSULE | Freq: Three times a day (TID) | ORAL | Status: DC
  Administered 2016-12-19 – 2016-12-22 (×8): 100 mg via ORAL
  Filled 2016-12-19 (×9): qty 1

## 2016-12-19 MED ORDER — DEXAMETHASONE SODIUM PHOSPHATE 10 MG/ML IJ SOLN
INTRAMUSCULAR | Status: DC | PRN
  Administered 2016-12-19: 10 mg via INTRAVENOUS

## 2016-12-19 MED ORDER — OXYCODONE HCL 5 MG/5ML PO SOLN: 5.0000 mg | Freq: Once | ORAL | Status: DC | PRN

## 2016-12-19 MED ORDER — FENTANYL CITRATE (PF) 100 MCG/2ML IJ SOLN
INTRAMUSCULAR | Status: AC
  Filled 2016-12-19: qty 2

## 2016-12-19 MED ORDER — BUPROPION HCL ER (XL) 150 MG PO TB24
150.0000 mg | ORAL_TABLET | Freq: Every day | ORAL | Status: DC
  Administered 2016-12-20 – 2016-12-22 (×3): 150 mg via ORAL
  Filled 2016-12-19 (×3): qty 1

## 2016-12-19 MED ORDER — CETIRIZINE HCL 10 MG PO TABS
10.0000 mg | ORAL_TABLET | Freq: Every evening | ORAL | Status: DC
  Administered 2016-12-19 – 2016-12-21 (×3): 10 mg via ORAL
  Filled 2016-12-19 (×4): qty 1

## 2016-12-19 MED ORDER — SODIUM CHLORIDE 0.9 % IV SOLN
INTRAVENOUS | Status: DC
  Administered 2016-12-19 – 2016-12-20 (×2): via INTRAVENOUS

## 2016-12-19 MED ORDER — KETAMINE HCL-SODIUM CHLORIDE 100-0.9 MG/10ML-% IV SOSY
PREFILLED_SYRINGE | INTRAVENOUS | Status: AC
  Filled 2016-12-19: qty 10

## 2016-12-19 MED ORDER — LIDOCAINE HCL (CARDIAC) 20 MG/ML IV SOLN
INTRAVENOUS | Status: DC | PRN
  Administered 2016-12-19: 100 mg via INTRATRACHEAL

## 2016-12-19 MED ORDER — KETAMINE HCL 100 MG/ML IJ SOLN
INTRAMUSCULAR | Status: AC
  Filled 2016-12-19: qty 1

## 2016-12-19 MED ORDER — CYCLOBENZAPRINE HCL 10 MG PO TABS: 5.0000 mg | ORAL_TABLET | Freq: Every day | ORAL | Status: DC

## 2016-12-19 MED ORDER — SORBITOL 70 % SOLN: 30.0000 mL | Freq: Every day | Status: DC | PRN

## 2016-12-19 MED ORDER — TRANEXAMIC ACID 1000 MG/10ML IV SOLN
2000.0000 mg | INTRAVENOUS | Status: AC
  Administered 2016-12-19: 2000 mg via TOPICAL
  Filled 2016-12-19: qty 20

## 2016-12-19 MED ORDER — SENNOSIDES-DOCUSATE SODIUM 8.6-50 MG PO TABS: 1.0000 | ORAL_TABLET | Freq: Every evening | ORAL | Status: DC | PRN

## 2016-12-19 MED ORDER — FLEET ENEMA 7-19 GM/118ML RE ENEM: 1.0000 | ENEMA | Freq: Once | RECTAL | Status: DC | PRN

## 2016-12-19 MED ORDER — SODIUM CHLORIDE 0.9% FLUSH
INTRAVENOUS | Status: DC | PRN
  Administered 2016-12-19: 50 mL

## 2016-12-19 MED ORDER — LIDOCAINE 2% (20 MG/ML) 5 ML SYRINGE
INTRAMUSCULAR | Status: AC
  Filled 2016-12-19: qty 5

## 2016-12-19 MED ORDER — LACTATED RINGERS IV SOLN
INTRAVENOUS | Status: DC | PRN
  Administered 2016-12-19 (×2): via INTRAVENOUS

## 2016-12-19 MED ORDER — TRANEXAMIC ACID 1000 MG/10ML IV SOLN
1000.0000 mg | Freq: Once | INTRAVENOUS | Status: AC
  Administered 2016-12-19: 1000 mg via INTRAVENOUS
  Filled 2016-12-19: qty 10

## 2016-12-19 MED ORDER — OXYCODONE HCL 30 MG PO TABS: 30.0000 mg | ORAL_TABLET | ORAL | 0 refills | Status: DC | PRN

## 2016-12-19 MED ORDER — PROPOFOL 10 MG/ML IV BOLUS
INTRAVENOUS | Status: AC
  Filled 2016-12-19: qty 20

## 2016-12-19 SURGICAL SUPPLY — 64 items
ANCHOR ROTATOR CUFF #2 (Anchor) ×4 IMPLANT
BANDAGE ACE 4X5 VEL STRL LF (GAUZE/BANDAGES/DRESSINGS) ×2 IMPLANT
BANDAGE ACE 6X5 VEL STRL LF (GAUZE/BANDAGES/DRESSINGS) ×2 IMPLANT
BANDAGE ESMARK 6X9 LF (GAUZE/BANDAGES/DRESSINGS) ×1 IMPLANT
BLADE SAGITTAL 25.0X1.19X90 (BLADE) ×2 IMPLANT
BLADE SAW SAG 90X13X1.27 (BLADE) ×2 IMPLANT
BNDG ESMARK 6X9 LF (GAUZE/BANDAGES/DRESSINGS) ×2
BONE CEMENT GENTAMICIN (Cement) ×2 IMPLANT
BOWL SMART MIX CTS (DISPOSABLE) ×2 IMPLANT
CAP KNEE TOTAL 3 SIGMA ×2 IMPLANT
CEMENT BONE GENTAMICIN 40 (Cement) ×1 IMPLANT
COVER SURGICAL LIGHT HANDLE (MISCELLANEOUS) ×2 IMPLANT
CUFF TOURNIQUET SINGLE 34IN LL (TOURNIQUET CUFF) ×2 IMPLANT
CUFF TOURNIQUET SINGLE 44IN (TOURNIQUET CUFF) IMPLANT
DRAPE INCISE IOBAN 66X45 STRL (DRAPES) ×2 IMPLANT
DRAPE ORTHO SPLIT 77X108 STRL (DRAPES) ×2
DRAPE SURG ORHT 6 SPLT 77X108 (DRAPES) ×2 IMPLANT
DRAPE U-SHAPE 47X51 STRL (DRAPES) ×2 IMPLANT
DRSG ADAPTIC 3X8 NADH LF (GAUZE/BANDAGES/DRESSINGS) ×2 IMPLANT
DRSG PAD ABDOMINAL 8X10 ST (GAUZE/BANDAGES/DRESSINGS) ×2 IMPLANT
DURAPREP 26ML APPLICATOR (WOUND CARE) ×2 IMPLANT
ELECT REM PT RETURN 9FT ADLT (ELECTROSURGICAL) ×2
ELECTRODE REM PT RTRN 9FT ADLT (ELECTROSURGICAL) ×1 IMPLANT
EVACUATOR 1/8 PVC DRAIN (DRAIN) IMPLANT
FACESHIELD WRAPAROUND (MASK) ×4 IMPLANT
FLOSEAL 10ML (HEMOSTASIS) IMPLANT
GAUZE SPONGE 4X4 12PLY STRL (GAUZE/BANDAGES/DRESSINGS) ×2 IMPLANT
GLOVE BIOGEL PI IND STRL 8 (GLOVE) ×4 IMPLANT
GLOVE BIOGEL PI INDICATOR 8 (GLOVE) ×4
GLOVE ORTHO TXT STRL SZ7.5 (GLOVE) ×2 IMPLANT
GLOVE SURG ORTHO 8.0 STRL STRW (GLOVE) ×2 IMPLANT
GOWN STRL REUS W/ TWL LRG LVL3 (GOWN DISPOSABLE) ×2 IMPLANT
GOWN STRL REUS W/ TWL XL LVL3 (GOWN DISPOSABLE) ×1 IMPLANT
GOWN STRL REUS W/TWL 2XL LVL3 (GOWN DISPOSABLE) ×2 IMPLANT
GOWN STRL REUS W/TWL LRG LVL3 (GOWN DISPOSABLE) ×2
GOWN STRL REUS W/TWL XL LVL3 (GOWN DISPOSABLE) ×1
HANDPIECE INTERPULSE COAX TIP (DISPOSABLE) ×1
HOOD PEEL AWAY FACE SHEILD DIS (HOOD) ×2 IMPLANT
IMMOBILIZER KNEE 22 UNIV (SOFTGOODS) ×2 IMPLANT
KIT BASIN OR (CUSTOM PROCEDURE TRAY) ×2 IMPLANT
KIT ROOM TURNOVER OR (KITS) ×2 IMPLANT
MANIFOLD NEPTUNE II (INSTRUMENTS) ×2 IMPLANT
NEEDLE 22X1 1/2 (OR ONLY) (NEEDLE) ×4 IMPLANT
NS IRRIG 1000ML POUR BTL (IV SOLUTION) ×2 IMPLANT
PACK TOTAL JOINT (CUSTOM PROCEDURE TRAY) ×2 IMPLANT
PAD ARMBOARD 7.5X6 YLW CONV (MISCELLANEOUS) ×4 IMPLANT
PAD CAST 4YDX4 CTTN HI CHSV (CAST SUPPLIES) ×1 IMPLANT
PADDING CAST COTTON 4X4 STRL (CAST SUPPLIES) ×1
PADDING CAST COTTON 6X4 STRL (CAST SUPPLIES) ×2 IMPLANT
SET HNDPC FAN SPRY TIP SCT (DISPOSABLE) ×1 IMPLANT
STAPLER VISISTAT 35W (STAPLE) ×2 IMPLANT
SUCTION FRAZIER HANDLE 10FR (MISCELLANEOUS) ×1
SUCTION TUBE FRAZIER 10FR DISP (MISCELLANEOUS) ×1 IMPLANT
SUT ETHIBOND NAB CT1 #1 30IN (SUTURE) ×6 IMPLANT
SUT VIC AB 0 CT1 27 (SUTURE) ×1
SUT VIC AB 0 CT1 27XBRD ANBCTR (SUTURE) ×1 IMPLANT
SUT VIC AB 2-0 CT1 27 (SUTURE) ×2
SUT VIC AB 2-0 CT1 TAPERPNT 27 (SUTURE) ×2 IMPLANT
SYR CONTROL 10ML LL (SYRINGE) ×4 IMPLANT
TOWEL OR 17X24 6PK STRL BLUE (TOWEL DISPOSABLE) ×2 IMPLANT
TOWEL OR 17X26 10 PK STRL BLUE (TOWEL DISPOSABLE) ×2 IMPLANT
TRAY CATH 16FR W/PLASTIC CATH (SET/KITS/TRAYS/PACK) IMPLANT
TRAY FOLEY W/METER SILVER 16FR (SET/KITS/TRAYS/PACK) IMPLANT
WATER STERILE IRR 1000ML POUR (IV SOLUTION) ×2 IMPLANT

## 2016-12-19 NOTE — Progress Notes (Signed)
Patient ID: Mark [name], male   DOB: 06-26-1965, 51 y.o.   MRN: [id number]   Patient is on known high dose chronic narcotics managed by pain management.  He brought up in discussion in the preop holding area with Dr. Madelon Lipsaffrey this morning that he knows he has a problem and is addicted to opioids and knowingly taking even higher doses than prescribed.  He is seeking help with his addiction.  We do feel that the arthritic knee is a source of his chronic pain and still in his best interest to proceed with knee replacement today, but will try to obtain resources or referrals to assist with his opioid addiction as well.

## 2016-12-19 NOTE — Transfer of Care (Signed)
Immediate Anesthesia Transfer of Care Note  Patient: Mark [name]  Procedure(s) Performed: Procedure(s): TOTAL KNEE ARTHROPLASTY (Right)  Patient Location: PACU  Anesthesia Type:MAC, Spinal and MAC combined with regional for post-op pain  Level of Consciousness: sedated, patient cooperative and responds to stimulation  Airway & Oxygen Therapy: Patient Spontanous Breathing and Patient connected to nasal cannula oxygen  Post-op Assessment: Report given to RN and Post -op Vital signs reviewed and stable  Post vital signs: Reviewed and stable  Last Vitals:  Vitals:   12/19/16 0654 12/19/16 1035  BP: (!) 171/70   Pulse: 65   Resp: 20   Temp: 36.9 C (P) 36.4 C    Last Pain:  Vitals:   12/19/16 1035  TempSrc:   PainSc: (P) Asleep      Patients Stated Pain Goal: 3 (74/12/87 8676)  Complications: No apparent anesthesia complications

## 2016-12-19 NOTE — Anesthesia Procedure Notes (Signed)
Procedure Name: MAC Date/Time: 12/19/2016 7:38 AM Performed by: Lance Coon Pre-anesthesia Checklist: Patient identified, Emergency Drugs available, Suction available, Patient being monitored and Timeout performed Patient Re-evaluated:Patient Re-evaluated prior to inductionOxygen Delivery Method: Nasal cannula

## 2016-12-19 NOTE — H&P (View-Only) (Signed)
TOTAL KNEE ADMISSION H&P  Patient is being admitted for right total knee arthroplasty.  Subjective:  Chief Complaint:right knee pain.  HPI: Mark [name], 51 y.o. male, has a history of pain and functional disability in the right knee due to arthritis and has failed non-surgical conservative treatments for greater than 12 weeks to includeNSAID's and/or analgesics, corticosteriod injections, viscosupplementation injections, use of assistive devices, weight reduction as appropriate and activity modification.  Onset of symptoms was gradual, starting >10 years ago with gradually worsening course since that time. The patient noted prior procedures on the knee to include  menisectomy on the right knee(s).  Patient currently rates pain in the right knee(s) at 10 out of 10 with activity. Patient has night pain, worsening of pain with activity and weight bearing, pain that interferes with activities of daily living, pain with passive range of motion, crepitus and joint swelling.  Patient has evidence of periarticular osteophytes and joint space narrowing by imaging studies. There is no active infection.  Patient Active Problem List   Diagnosis Date Noted  . Primary osteoarthritis of right hip 06/09/2016  . History of gastric bypass 06/09/2016  . Encounter for screening colonoscopy 11/26/2015  . Primary localized osteoarthritis of left knee 07/27/2015  . Hypertension 06/14/2014  . Hyperlipemia 06/14/2014  . Osteoarthritis 06/14/2014  . Chronic pain 06/14/2014  . Sleep apnea 06/14/2014  . Pain in the chest 06/14/2014   Past Medical History:  Diagnosis Date  . Anemia    low iron  . Anxiety   . Astigmatism of left eye   . Depression   . Fatigue   . GERD (gastroesophageal reflux disease)    heartburn occ - better since having gastric bypass  . Headache    migraines - none for years, sinus headaches at times  . Hyperlipemia   . Knee pain   . Obesity, unspecified   . Osteoarthritis   .  Pneumonia    as child   . Shoulder pain   . Trigger finger (acquired)   . Unspecified sleep apnea    uses cpap    Past Surgical History:  Procedure Laterality Date  . CARPAL TUNNEL RELEASE Bilateral   . FRACTURE SURGERY     all fingers expect l thumb  . GASTRIC BYPASS  2011  . JOINT REPLACEMENT    . KNEE ARTHROSCOPY Right   . KNEE ARTHROSCOPY Left 11/15/14  . SHOULDER SURGERY Left 2006  . SHOULDER SURGERY Right 2012 and 2017  . TOTAL HIP ARTHROPLASTY Right 08/05/2016   Procedure: TOTAL HIP ARTHROPLASTY ANTERIOR APPROACH;  Surgeon: Sheral Apleyimothy D Murphy, MD;  Location: [facility] OR;  Service: Orthopedics;  Laterality: Right;  . TOTAL KNEE ARTHROPLASTY Left 07/27/2015   Procedure: LEFT TOTAL KNEE ARTHROPLASTY;  Surgeon: Frederico Hammananiel Caffrey, MD;  [facility];  Service: Orthopedics;  Laterality: Left;     (Not in a hospital admission) Allergies  Allergen Reactions  . Penicillins Hives    Has patient had a PCN reaction causing immediate rash, facial/tongue/throat swelling, SOB or lightheadedness with hypotension: Yes Has patient had a PCN reaction causing severe rash involving mucus membranes or skin necrosis: No Has patient had a PCN reaction that required hospitalization unknown "probably not" Has patient had a PCN reaction occurring within the last 10 years: No If all of the above answers are "NO", then may proceed with Cephalosporin use.  Jonne Ply. Asa [Aspirin] Other (See Comments)    gastric bypass   . Ibuprofen Other (See Comments)  gastric bypass  . Morphine And Related Nausea And Vomiting and Other (See Comments)    headaches  . Adhesive [Tape] Rash    Please use paper tape, can use cloth tape  States plastic bandaids irritate    Social History  Substance Use Topics  . Smoking status: Never Smoker  . Smokeless tobacco: Former Neurosurgeon    Types: Chew    Quit date: 07/15/1994  . Alcohol use 0.0 oz/week     Comment: One every 1-2 months    Family History  Problem Relation Age of Onset  .  Heart failure Mother   . Diabetes Mother   . COPD Mother   . Cancer Father   . Arthritis Father   . Colon cancer Neg Hx      Review of Systems  Gastrointestinal: Positive for nausea and vomiting.  Genitourinary: Positive for frequency.  Musculoskeletal: Positive for back pain and joint pain.  Neurological: Positive for headaches.  All other systems reviewed and are negative.   Objective:  Physical Exam  Constitutional: He is oriented to person, place, and time. He appears well-developed and well-nourished. No distress.  HENT:  Head: Normocephalic and atraumatic.  Nose: Nose normal.  Eyes: EOM are normal.  Neck: Normal range of motion. Neck supple.  Cardiovascular: Normal rate, regular rhythm, normal heart sounds and intact distal pulses.   Respiratory: Breath sounds normal. No respiratory distress.  GI: Soft. Bowel sounds are normal. He exhibits no distension. There is no tenderness.  Musculoskeletal:       Right knee: He exhibits swelling. He exhibits normal range of motion. Tenderness found.  Neurological: He is alert and oriented to person, place, and time. No cranial nerve deficit.  Skin: Skin is warm. No rash noted. No erythema.  Psychiatric: He has a normal mood and affect. His behavior is normal.    Vital signs in last 24 hours: @VSRANGES @  Labs:   Estimated body mass index is 41.19 kg/m as calculated from the following:   Height as of 07/29/16: 5\' 11"  (1.803 m).   Weight as of 07/29/16: 133.9 kg (295 lb 4.8 oz).   Imaging Review Plain radiographs demonstrate severe degenerative joint disease of the right knee(s). The overall alignment issignificant valgus. The bone quality appears to be good for age and reported activity level.  Assessment/Plan:  End stage arthritis, right knee   The patient history, physical examination, clinical judgment of the provider and imaging studies are consistent with end stage degenerative joint disease of the right knee(s) and  total knee arthroplasty is deemed medically necessary. The treatment options including medical management, injection therapy arthroscopy and arthroplasty were discussed at length. The risks and benefits of total knee arthroplasty were presented and reviewed. The risks due to aseptic loosening, infection, stiffness, patella tracking problems, thromboembolic complications and other imponderables were discussed. The patient acknowledged the explanation, agreed to proceed with the plan and consent was signed. Patient is being admitted for inpatient treatment for surgery, pain control, PT, OT, prophylactic antibiotics, VTE prophylaxis, progressive ambulation and ADL's and discharge planning. The patient is planning to be discharged home with home health services

## 2016-12-19 NOTE — Brief Op Note (Signed)
12/19/2016  10:36 AM  PATIENT:  Jennette BankerJames R Leckrone  51 y.o. male  PRE-OPERATIVE DIAGNOSIS:  OA RIGHT KNEE  POST-OPERATIVE DIAGNOSIS:  OA RIGHT KNEE  PROCEDURE:  Procedure(s): TOTAL KNEE ARTHROPLASTY (Right)  SURGEON:  Surgeon(s) and Role:    Frederico Hamman* Caffrey, Daniel, MD - Primary  PHYSICIAN ASSISTANT: Margart SicklesJoshua Peyten Weare, PA-C  ASSISTANTS:    ANESTHESIA:   local, regional, spinal and IV sedation  EBL:  Total I/O In: 2000 [I.V.:2000] Out: 850 [Urine:800; Blood:50]  BLOOD ADMINISTERED:none  DRAINS: none   LOCAL MEDICATIONS USED:  MARCAINE     SPECIMEN:  No Specimen  DISPOSITION OF SPECIMEN:  N/A  COUNTS:  YES  TOURNIQUET:   Total Tourniquet Time Documented: Thigh (Right) - 77 minutes Total: Thigh (Right) - 77 minutes   DICTATION: .Other Dictation: Dictation Number unknown  PLAN OF CARE: Admit to inpatient   PATIENT DISPOSITION:  PACU - hemodynamically stable.   Delay start of Pharmacological VTE agent (>24hrs) due to surgical blood loss or risk of bleeding: yes

## 2016-12-19 NOTE — Interval H&P Note (Signed)
History and Physical Interval Note:  12/19/2016 7:32 AM  Mark [name]  has presented today for surgery, with the diagnosis of OA RIGHT KNEE  The various methods of treatment have been discussed with the patient and family. After consideration of risks, benefits and other options for treatment, the patient has consented to  Procedure(s): TOTAL KNEE ARTHROPLASTY (Right) as a surgical intervention .  The patient's history has been reviewed, patient examined, no change in status, stable for surgery.  I have reviewed the patient's chart and labs.  Questions were answered to the patient's satisfaction.     Jeanette Rauth JR,W D

## 2016-12-19 NOTE — Progress Notes (Signed)
Orthopedic Tech Progress Note Patient Details:  Mark [name] 04/23/1966 [id number]  CPM Right Knee CPM Right Knee: On Right Knee Flexion (Degrees): 90 Right Knee Extension (Degrees): 0   Mark [name], Mark [name] 12/19/2016, 11:24 AM Viewed order from doctor's order list; ohf not applied because pt's weight exceeds durability of frame; RN notified

## 2016-12-19 NOTE — Anesthesia Procedure Notes (Signed)
Anesthesia Regional Block: Adductor canal block   Pre-Anesthetic Checklist: ,, timeout performed, Correct Patient, Correct Site, Correct Laterality, Correct Procedure, Correct Position, site marked, Risks and benefits discussed,  Surgical consent,  Pre-op evaluation,  At surgeon's request and post-op pain management  Laterality: Right  Prep: chloraprep       Needles:  Injection technique: Single-shot  Needle Type: Stimiplex     Needle Length: 9cm  Needle Gauge: 21     Additional Needles:   Procedures: ultrasound guided,,,,,,,,  Narrative:  Start time: 12/19/2016 7:13 AM End time: 12/19/2016 7:16 AM Injection made incrementally with aspirations every 5 mL.  Performed by: Personally  Anesthesiologist: Lewie LoronGERMEROTH, Darlin Stenseth  Additional Notes: BP cuff, EKG monitors applied. Sedation begun. Artery and nerve location verified with U/S and anesthetic injected incrementally, slowly, and after negative aspirations under direct u/s guidance. Good fascial /perineural spread. Tolerated well.

## 2016-12-19 NOTE — Progress Notes (Signed)
RT set up CPAP and adjusted mask to fit patient. Patient stated he would place himself on CPAP when he is ready for bed. RT informed patient to have RT called if he needs assistance. RT will monitor as needed. 

## 2016-12-19 NOTE — Anesthesia Procedure Notes (Signed)
Spinal  Patient location during procedure: OR Staffing Anesthesiologist: Nolon Nations Performed: anesthesiologist  Preanesthetic Checklist Completed: patient identified, site marked, surgical consent, pre-op evaluation, timeout performed, IV checked, risks and benefits discussed and monitors and equipment checked Spinal Block Patient position: sitting Prep: ChloraPrep and site prepped and draped Patient monitoring: heart rate, continuous pulse ox and blood pressure Approach: midline Location: L3-4 Injection technique: single-shot Needle Needle type: Sprotte  Needle gauge: 24 G Needle length: 9 cm Additional Notes Expiration date of kit checked and confirmed. Patient tolerated procedure well, without complications.

## 2016-12-19 NOTE — Anesthesia Postprocedure Evaluation (Signed)
Anesthesia Post Note  Patient: Jennette BankerJames R Birden  Procedure(s) Performed: Procedure(s) (LRB): TOTAL KNEE ARTHROPLASTY (Right)     Patient location during evaluation: PACU Anesthesia Type: Regional and Spinal Level of consciousness: sedated and patient cooperative Pain management: pain level controlled Vital Signs Assessment: post-procedure vital signs reviewed and stable Respiratory status: spontaneous breathing Cardiovascular status: stable Anesthetic complications: no Comments: Some chest pressure in PACU, EKG no change. No jaw pain, arm pain, nausea. Reassured pt.     Last Vitals:  Vitals:   12/19/16 1405 12/19/16 1439  BP: 139/90   Pulse: 60   Resp: 15   Temp:  36.4 C    Last Pain:  Vitals:   12/19/16 1439  TempSrc:   PainSc: 0-No pain    LLE Motor Response: Purposeful movement;Responds to commands (12/19/16 1439) LLE Sensation: Decreased;Tingling (spinal) (12/19/16 1439) RLE Motor Response: Purposeful movement;Responds to commands (12/19/16 1439) RLE Sensation: Decreased;Tingling (regional and spinal) (12/19/16 1439) L Sensory Level: S1-Sole of foot, small toes (12/19/16 1439) R Sensory Level: S1-Sole of foot, small toes (12/19/16 1439)  Lewie LoronJohn Hadiyah Maricle

## 2016-12-19 NOTE — Op Note (Signed)
NAME:  Mark [name], Mark [name] NO.:  [id number]  MEDICAL RECORD NO.:  [id number]  LOCATION:                                 FACILITY:  PHYSICIAN:  Dyke Brackett, M.D.    DATE OF BIRTH:  16-Sep-1965  DATE OF PROCEDURE:  12/19/2016 DATE OF DISCHARGE:                              OPERATIVE REPORT   PREOPERATIVE DIAGNOSIS:  Severe osteoarthritis, right knee with valgus deformity.  POSTOPERATIVE DIAGNOSIS:  Severe osteoarthritis, right knee with valgus deformity.  OPERATION: 1. Right total knee replacement (Sigma size 4 femur, 5 tibia, 10 mm     bridging bearing with 39 mm all poly patella. 2. Patellar tendon repair.  SURGEON:  Dyke Brackett, M.D.  ANESTHESIA:  Spinal anesthetic.  TOURNIQUET TIME:  78 minutes.  DESCRIPTION OF PROCEDURE:  Supine position, inflation of a thigh tourniquet to 375 mmHg.  Straight skin incision with medial parapatellar approach to the knee made.  Significant soft tissue and bony deformity was noted.  We basically was not completely released, we had to release patellar tendon as a sleeve and later repaired it with 2 Mitek sutures. Although it did not traumatically avulsed, it was a controlled release; however, we did have to repair at the conclusion of the case.  We cut 11 mm 5-degree valgus cut of the distal femur, followed by cutting below the most diseased lateral compartment with secondary provisional cuts to obtain full extension as he did have a flexion contracture preoperatively settling on a 10 mm bearing.  The femur was sized to be a size 4 as the opposite side was done.  We then placed all-in-1 cutting block and cut the anterior, posterior chamfers as well as the chamfer cuts.  PCL was released.  Excess menisci removed for the aspect of the knee as well as posterior osteophytes.  A keel hole was cut using a size tibia as there was a mismatch relative to femur tibia, obviously using the bearing size relative to the femur.  Patella  trial then cut initially to allow better exposure and then we settled on a 39 mm all poly trial.  All trials were placed, deemed to be acceptable.  We had previously cut the box, cut for the femur as well.  Full extension, resolution of valgus deformity, and flexion contracture were noted. Excellent stability, no tendency for mismatch, bearing spin out, and there was good balancing of the ligaments.  Cement was prepared on the back table with antibiotic impregnated gentamicin.  We inserted a mixture of Exparel and Marcaine into the capsular subcutaneous tissues. The final components were inserted with cement in a doughy state with the trial bearing.  Cement was allowed to harden and then the bearing was removed.  Small bits of cement were noted on the posterior aspect of the knee were removed.  Tourniquet was released.  No excessive bleeding was noted.  Final bearing was placed and then admits to repair of the patellar tendon.  Closure was affected with #1 Ethibond, 0 and 2- 0 Vicryl, and skin clips.  Taken to recovery room in stable condition.     Dyke Brackett, M.D.   ______________________________ Lacretia Nicks.  Dava Najjar. Leonia Heatherly, M.D.    WDC/MEDQ  D:  12/19/2016  T:  12/19/2016  Job:  [id number]

## 2016-12-19 NOTE — Progress Notes (Signed)
Pt reporting chest pain feeling like a heaviness on his chest going on for the past few minutes. Dr. Renold DonGermeroth made aware. Order for EKG received and completed. Dr. Renold DonGermeroth states he will come see patient.

## 2016-12-19 NOTE — Evaluation (Signed)
Physical Therapy Evaluation Patient Details Name: Mark [name] MRN: [id number] DOB: 10-07-1965 Today's Date: 12/19/2016   History of Present Illness  Pt is 51 y/o male s/p elective R TKA secondary to R knee OA. PMH includes chronic pain with history of opiod addiction, HTN, anxiety/depression, sleep apnea, and s/p L TKA, R THA, and gastric bypass.   Clinical Impression  Pt s/p surgery above with deficits below. PTA, pt reports he was independent with mobility. Upon evaluation, pt very limited secondary to post op pain. Able to take a few steps EOB, however, refused further mobility secondary to pain. Required min guard for safety during mobility tasks. Will need to progress mobility in order to ensure safety at d/c. Anticipate pt will progress well once pain controlled. Follow up plan per MD arrangements. Will continue to follow acutely to maximize functional mobility independence.     Follow Up Recommendations DC plan and follow up therapy as arranged by surgeon;Supervision/Assistance - 24 hour    Equipment Recommendations  None recommended by PT    Recommendations for Other Services       Precautions / Restrictions Precautions Precautions: Knee Precaution Booklet Issued: Yes (comment) Precaution Comments: Reviewed supine ther ex. Tolerance limited secondary to pain.  Required Braces or Orthoses: Knee Immobilizer - Right Knee Immobilizer - Right: Other (comment) (until discontinued) Restrictions Weight Bearing Restrictions: Yes RLE Weight Bearing: Weight bearing as tolerated      Mobility  Bed Mobility Overal bed mobility: Needs Assistance Bed Mobility: Supine to Sit;Sit to Supine     Supine to sit: Mod assist Sit to supine: Min assist   General bed mobility comments: Min to mod A for RLE management secondary to pain. Required increased time and use of elevated HOB and bedrails.   Transfers Overall transfer level: Needs assistance Equipment used: Rolling walker (2  wheeled) Transfers: Sit to/from Stand Sit to Stand: Min guard         General transfer comment: Min guard for safety. Pt requiring extra time secondary to pain. Stood for prolonged period practicing weightshifting on RLE to encourage weight bearing on RLE. Pt stood for extended period to void. NT in room.   Ambulation/Gait Ambulation/Gait assistance: Min guard   Assistive device: Rolling walker (2 wheeled) Gait Pattern/deviations: Step-to pattern;Decreased step length - right;Decreased weight shift to right;Antalgic Gait velocity: decreased Gait velocity interpretation: Below normal speed for age/gender General Gait Details: Practiced taking a couple steps forward and backwards at EOB, however, pt refusing further mobility past that. Very limited tolerance secondary to pain. Cues for sequencing. Will need to progress ambulation in next session.   Stairs            Wheelchair Mobility    Modified Rankin (Stroke Patients Only)       Balance Overall balance assessment: Needs assistance Sitting-balance support: No upper extremity supported;Feet supported Sitting balance-Leahy Scale: Good     Standing balance support: Bilateral upper extremity supported;No upper extremity supported;During functional activity Standing balance-Leahy Scale: Fair Standing balance comment: Able to maintain static balance without UE support. Stood for extended period in order to void.                              Pertinent Vitals/Pain Pain Assessment: 0-10 Pain Score: 9  Pain Location: L knee  Pain Descriptors / Indicators: Aching;Operative site guarding;Sore Pain Intervention(s): Limited activity within patient's tolerance;Monitored during session;Repositioned;Patient requesting pain meds-RN notified    Home  Living Family/patient expects to be discharged to:: Private residence Living Arrangements: Spouse/significant other;Children Available Help at Discharge: Family;Available 24  hours/day Type of Home: House Home Access: Stairs to enter Entrance Stairs-Rails: Right;Left;Can reach both Entrance Stairs-Number of Steps: 6 Home Layout: One level Home Equipment: Walker - 2 wheels;Bedside commode;Shower seat;Adaptive equipment;Cane - single point      Prior Function Level of Independence: Independent         Comments: Mostly independent with occasional use of cane      Hand Dominance   Dominant Hand: Right    Extremity/Trunk Assessment   Upper Extremity Assessment Upper Extremity Assessment: Defer to OT evaluation    Lower Extremity Assessment Lower Extremity Assessment: RLE deficits/detail RLE Deficits / Details: Sensory in tact. Deficits consistent with post op pain and weakness. Very limited tolerance for exercise secondary to pain.     Cervical / Trunk Assessment Cervical / Trunk Assessment: Normal  Communication   Communication: No difficulties  Cognition Arousal/Alertness: Awake/alert Behavior During Therapy: WFL for tasks assessed/performed Overall Cognitive Status: Within Functional Limits for tasks assessed                                        General Comments General comments (skin integrity, edema, etc.): Pt very limited secondary to pain and self limiting. Educated about importance of continued mobility.     Exercises Total Joint Exercises Ankle Circles/Pumps: AROM;Both;10 reps;Supine Quad Sets: AROM;Right;10 reps;Supine Towel Squeeze: AROM;Both;10 reps;Supine Hip ABduction/ADduction: AAROM;Right;10 reps;Supine   Assessment/Plan    PT Assessment Patient needs continued PT services  PT Problem List Decreased strength;Decreased range of motion;Decreased activity tolerance;Decreased balance;Decreased mobility;Decreased knowledge of use of DME;Decreased knowledge of precautions;Pain       PT Treatment Interventions DME instruction;Gait training;Stair training;Functional mobility training;Therapeutic  activities;Therapeutic exercise;Balance training;Neuromuscular re-education;Patient/family education    PT Goals (Current goals can be found in the Care Plan section)  Acute Rehab PT Goals Patient Stated Goal: to decrease pain  PT Goal Formulation: With patient Time For Goal Achievement: 12/26/16 Potential to Achieve Goals: Good    Frequency 7X/week   Barriers to discharge        Co-evaluation               AM-PAC PT "6 Clicks" Daily Activity  Outcome Measure Difficulty turning over in bed (including adjusting bedclothes, sheets and blankets)?: Total Difficulty moving from lying on back to sitting on the side of the bed? : Total Difficulty sitting down on and standing up from a chair with arms (e.g., wheelchair, bedside commode, etc,.)?: Total Help needed moving to and from a bed to chair (including a wheelchair)?: A Little Help needed walking in hospital room?: A Lot Help needed climbing 3-5 steps with a railing? : Total 6 Click Score: 9    End of Session Equipment Utilized During Treatment: Gait belt;Right knee immobilizer Activity Tolerance: Patient limited by pain Patient left: in bed;with call bell/phone within reach Nurse Communication: Mobility status PT Visit Diagnosis: Other abnormalities of gait and mobility (R26.89);Pain Pain - Right/Left: Right Pain - part of body: Knee    Time: [id number] PT Time Calculation (min) (ACUTE ONLY): 38 min   Charges:   PT Evaluation $PT Eval Moderate Complexity: 1 Procedure PT Treatments $Therapeutic Activity: 8-22 mins   PT G Codes:        Gladys DammeBrittany Irving Bloor, PT, DPT  Acute Rehabilitation Services  Pager: [phone]  Lehman Prom 12/19/2016, 6:40 PM

## 2016-12-20 LAB — BASIC METABOLIC PANEL
ANION GAP: 5 (ref 5–15)
BUN: 8 mg/dL (ref 6–20)
CALCIUM: 8.3 mg/dL — AB (ref 8.9–10.3)
CHLORIDE: 102 mmol/L (ref 101–111)
CO2: 26 mmol/L (ref 22–32)
CREATININE: 0.87 mg/dL (ref 0.61–1.24)
GFR calc non Af Amer: >60 mL/min (ref >60)
Glucose, Bld: 124 mg/dL — ABNORMAL HIGH (ref 65–99)
Potassium: 4.1 mmol/L (ref 3.5–5.1)
SODIUM: 133 mmol/L — AB (ref 135–145)

## 2016-12-20 LAB — CBC
HEMATOCRIT: 27.9 % — AB (ref 39.0–52.0)
HEMOGLOBIN: 9.2 g/dL — AB (ref 13.0–17.0)
MCH: 28.8 pg (ref 26.0–34.0)
MCHC: 33 g/dL (ref 30.0–36.0)
MCV: 87.5 fL (ref 78.0–100.0)
Platelets: 276 10*3/uL (ref 150–400)
RBC: 3.19 MIL/uL — ABNORMAL LOW (ref 4.22–5.81)
RDW: 13.2 % (ref 11.5–15.5)
WBC: 14.1 10*3/uL — AB (ref 4.0–10.5)

## 2016-12-20 NOTE — Progress Notes (Signed)
Placed patient on CPAP for the night via auto-mode with minimum pressure set at 6cm and maximum pressure set at 20cm  

## 2016-12-20 NOTE — Progress Notes (Signed)
Orthopedic Tech Progress Note Patient Details:  Mark [name] [birth year] [id number]  CPM Right Knee CPM Right Knee: On Right Knee Flexion (Degrees): 90 Right Knee Extension (Degrees): 0 Additional Comments: pt educated on bone foam/refused at this time   Saul FordyceJennifer C Shloime Keilman 12/20/2016, 4:27 PM

## 2016-12-20 NOTE — Progress Notes (Signed)
PT Cancellation Note  Patient Details Name: Mark BankerJames R Stepanek MRN: [id number] DOB: [birth year]   Cancelled Treatment:    Reason Eval/Treat Not Completed: Patient declined, no reason specified;Pain limiting ability to participate Pt reports he just got up to walk with OT, and is in too much pain to participate this morning. Requested to come after lunch. Will reattempt as schedule allows.   Gladys DammeBrittany Tharon Kitch, PT, DPT  Acute Rehabilitation Services  Pager: [phone]    Lehman PromBrittany S Emsley Custer 12/20/2016, 9:16 AM

## 2016-12-20 NOTE — Progress Notes (Signed)
Physical Therapy Treatment Patient Details Name: Mark [name] MRN: [id number] DOB: [birth year] Today's Date: 12/20/2016    History of Present Illness Pt is 51 y/o male s/p elective R TKA secondary to R knee OA. PMH includes chronic pain with history of opiod addiction, HTN, anxiety/depression, sleep apnea, and s/p L TKA, R THA, and gastric bypass.     PT Comments    Pt making very slow progress towards goals. Tolerated some gait training this session, however, limited to within the room. Requires multiple rest breaks during mobility, and very tearful throughout. Educated about importance of moving RLE to decrease pain. Educated about importance of getting up to chair, however, pt refusing this session. Notified RN to encourage mobility to chair later this evening. Pt currently unsafe to go home given limited activity tolerance, and if he is unable to progress mobility, may need to consider SNF prior to d/c home to ensure safety with mobility. Will continue to follow acutely to maximize functional mobility independence.   Follow Up Recommendations  DC plan and follow up therapy as arranged by surgeon;Supervision/Assistance - 24 hour     Equipment Recommendations  None recommended by PT    Recommendations for Other Services       Precautions / Restrictions Precautions Precautions: Knee Precaution Booklet Issued: Yes (comment) Precaution Comments: Reviewed supine ther ex. Continues to be limited secondary to pain.  Restrictions Weight Bearing Restrictions: Yes RLE Weight Bearing: Weight bearing as tolerated    Mobility  Bed Mobility Overal bed mobility: Needs Assistance Bed Mobility: Supine to Sit;Sit to Supine     Supine to sit: Min assist;Supervision Sit to supine: Supervision   General bed mobility comments: Min A initially to get LLE hooked under RLE, however, supervision after for bed mobility. Pt tearful throughout transfer and required extended time.    Transfers Overall transfer level: Needs assistance Equipment used: Rolling walker (2 wheeled) Transfers: Sit to/from Stand Sit to Stand: Min guard         General transfer comment: Min guard for safety, no physical assist required. Good hand placement. Increased pain upon standing. Required standing rest prior to ambulation.   Ambulation/Gait Ambulation/Gait assistance: Min guard Ambulation Distance (Feet): 15 Feet Assistive device: Rolling walker (2 wheeled) Gait Pattern/deviations: Step-to pattern;Decreased step length - right;Decreased weight shift to right;Antalgic Gait velocity: decreased Gait velocity interpretation: Below normal speed for age/gender General Gait Details: Slow, antalgic gait. Limited distance to within the room, as pt refusing further gait training secondary to pain. Pt tearful and required multiple standing rests. Verbal cues to increase WB on RLE to increase L step length, however, pt unable. Educated about importance of movement.    Stairs            Wheelchair Mobility    Modified Rankin (Stroke Patients Only)       Balance Overall balance assessment: Needs assistance Sitting-balance support: No upper extremity supported;Feet supported Sitting balance-Leahy Scale: Good     Standing balance support: No upper extremity supported;During functional activity Standing balance-Leahy Scale: Poor Standing balance comment: Reliant on UE support                             Cognition Arousal/Alertness: Awake/alert Behavior During Therapy: WFL for tasks assessed/performed Overall Cognitive Status: Within Functional Limits for tasks assessed  Exercises Total Joint Exercises Ankle Circles/Pumps: AROM;Both;10 reps;Supine Quad Sets: AROM;Right;10 reps;Supine Towel Squeeze: AROM;Both;10 reps;Supine Heel Slides: AAROM;Right;10 reps;Supine Hip ABduction/ADduction: AAROM;Right;10  reps;Supine    General Comments General comments (skin integrity, edema, etc.): Educated pt about importance of getting out of bed. Educated to get up with RN to get up to chair later in the day, as pt currently refusing. Notified RN to encourage mobility to chair this afternoon. Pt limited in ther ex this session and very tearful.       Pertinent Vitals/Pain Pain Assessment: Faces Faces Pain Scale: Hurts whole lot Pain Location: R knee Pain Descriptors / Indicators: Aching;Grimacing;Guarding;Crying Pain Intervention(s): Limited activity within patient's tolerance;Monitored during session;Repositioned    Home Living                      Prior Function            PT Goals (current goals can now be found in the care plan section) Acute Rehab PT Goals Patient Stated Goal: decrease pain PT Goal Formulation: With patient Time For Goal Achievement: 12/26/16 Potential to Achieve Goals: Good Progress towards PT goals: Progressing toward goals    Frequency    7X/week      PT Plan Current plan remains appropriate    Co-evaluation              AM-PAC PT "6 Clicks" Daily Activity  Outcome Measure  Difficulty turning over in bed (including adjusting bedclothes, sheets and blankets)?: A Little Difficulty moving from lying on back to sitting on the side of the bed? : Total Difficulty sitting down on and standing up from a chair with arms (e.g., wheelchair, bedside commode, etc,.)?: Total Help needed moving to and from a bed to chair (including a wheelchair)?: A Little Help needed walking in hospital room?: A Little Help needed climbing 3-5 steps with a railing? : A Lot 6 Click Score: 13    End of Session Equipment Utilized During Treatment: Gait belt Activity Tolerance: Patient limited by pain Patient left: in bed;with call bell/phone within reach Nurse Communication: Mobility status PT Visit Diagnosis: Other abnormalities of gait and mobility  (R26.89);Pain Pain - Right/Left: Right Pain - part of body: Knee     Time: [id number] PT Time Calculation (min) (ACUTE ONLY): 24 min  Charges:  $Gait Training: 8-22 mins $Therapeutic Exercise: 8-22 mins                    G Codes:       Gladys DammeBrittany Kaliana Albino, PT, DPT  Acute Rehabilitation Services  Pager: [phone]    Lehman PromBrittany S Makye Radle 12/20/2016, 3:23 PM

## 2016-12-20 NOTE — Care Management Note (Signed)
Case Management Note  Patient Details  Name: Mark [name] MRN: [id number] Date of Birth: September 30, 1965  Subjective/Objective:    Right TKA                Action/Plan: Discharge Planning: NCM spoke to pt via phone. Pt states he has RW and bedside commode at home. He had HH in the past. Reviewed chart and pt had [facility]. [facility] with [facility].   PCP Benita StabileHALL, JOHN Z   Expected Discharge Date:                  Expected Discharge Plan:  Home w Home Health Services  In-House Referral:  NA  Discharge planning Services  CM Consult  Post Acute Care Choice:  Home Health Choice offered to:  Patient  DME Arranged:  N/A DME Agency:  NA  HH Arranged:  NA HH Agency:  NA  Status of Service:  Completed, signed off  If discussed at Long Length of Stay Meetings, dates discussed:    Additional Comments:  Elliot CousinShavis, Aibhlinn Kalmar Ellen, RN 12/20/2016, 2:18 PM

## 2016-12-20 NOTE — Progress Notes (Signed)
Subjective: 1 Day Post-Op Procedure(s) (LRB): TOTAL KNEE ARTHROPLASTY (Right) Patient reports pain as moderate.    Objective: Vital signs in last 24 hours: Temp:  [97.5 F (36.4 C)-98.3 F (36.8 C)] 97.7 F (36.5 C) (06/30 0500) Pulse Rate:  [53-83] 75 (06/30 0500) Resp:  [12-20] 16 (06/30 0500) BP: (106-148)/(65-95) 124/70 (06/30 0500) SpO2:  [95 %-100 %] 97 % (06/30 0500)  Intake/Output from previous day: 06/29 0701 - 06/30 0700 In: 2665 [P.O.:480; I.V.:2075; IV Piggyback:110] Out: 3100 [Urine:3050; Blood:50] Intake/Output this shift: Total I/O In: 240 [P.O.:240] Out: -    Recent Labs  12/20/16 0318  HGB 9.2*    Recent Labs  12/20/16 0318  WBC 14.1*  RBC 3.19*  HCT 27.9*  PLT 276    Recent Labs  12/20/16 0318  NA 133*  K 4.1  CL 102  CO2 26  BUN 8  CREATININE 0.87  GLUCOSE 124*  CALCIUM 8.3*   No results for input(s): LABPT, INR in the last 72 hours.  Neurovascular intact Sensation intact distally Intact pulses distally Dorsiflexion/Plantar flexion intact Incision: dressing C/D/I  Assessment/Plan: 1 Day Post-Op Procedure(s) (LRB): TOTAL KNEE ARTHROPLASTY (Right) Up with therapy  WBAT RLE eliquis dvt proph Pain control as ordered CPM as ordered Plan for possible d/c home with hhpt tomorrow or Monday  Margart SicklesChadwell, Tyaira Heward 12/20/2016, 9:53 AM

## 2016-12-20 NOTE — Evaluation (Signed)
Occupational Therapy Evaluation and Discharge Patient Details Name: Mark [name] MRN: [id number] DOB: [birth year] Today's Date: 12/20/2016    History of Present Illness Pt is 51 y/o male s/p elective R TKA secondary to R knee OA. PMH includes chronic pain with history of opiod addiction, HTN, anxiety/depression, sleep apnea, and s/p L TKA, R THA, and gastric bypass.    Clinical Impression   Pt reports he was independent with ADL PTA. Currently pt min guard for functional mobility and mod assist for LB ADL. Pt able to recall proper use of equipment for safety with ADL at home, proper shower transfer technique from prior sxs and reports wife and children available to assist as needed upon return home. Pt limited by pain this session; educated on importance of OOB activity. Pt planning to d/c home with 24/7 supervision from family. No further acute OT needs identified; signing off at this time. Please re-consult if needs change. Thank you for this referral.    Follow Up Recommendations  No OT follow up;Supervision/Assistance - 24 hour (initially)    Equipment Recommendations  None recommended by OT    Recommendations for Other Services       Precautions / Restrictions Precautions Precautions: Knee Precaution Booklet Issued: No Precaution Comments: Educated pt on no pillow under knee Restrictions Weight Bearing Restrictions: Yes RLE Weight Bearing: Weight bearing as tolerated      Mobility Bed Mobility Overal bed mobility: Needs Assistance Bed Mobility: Supine to Sit;Sit to Supine     Supine to sit: Supervision;HOB elevated Sit to supine: Supervision;HOB elevated   General bed mobility comments: Pt using blanket as leg lifter to get RLE to EOB. Increased time required. HOB slightly elevated with minimal use of bed rail  Transfers Overall transfer level: Needs assistance Equipment used: Rolling walker (2 wheeled) Transfers: Sit to/from Stand Sit to Stand: Min guard          General transfer comment: Min guard for safety, no physical assist required. Good hand placement     Balance Overall balance assessment: Needs assistance Sitting-balance support: No upper extremity supported;Feet supported Sitting balance-Leahy Scale: Good     Standing balance support: Single extremity supported;During functional activity Standing balance-Leahy Scale: Fair                             ADL either performed or assessed with clinical judgement   ADL Overall ADL's : Needs assistance/impaired Eating/Feeding: Set up;Sitting   Grooming: Set up;Sitting   Upper Body Bathing: Set up;Sitting   Lower Body Bathing: Moderate assistance;Sit to/from stand   Upper Body Dressing : Set up;Sitting   Lower Body Dressing: Moderate assistance;Sit to/from stand Lower Body Dressing Details (indicate cue type and reason): Educated on compensatory strategies for LB ADL. Wife to assist as needed Toilet Transfer: Min guard;Ambulation;RW Toilet Transfer Details (indicate cue type and reason): Simulated by sit to stand from EOB with functional mobility in room. Pt able to stand at EOB with urinal to void.         Functional mobility during ADLs: Min guard;Rolling walker       Vision         Perception     Praxis      Pertinent Vitals/Pain Pain Assessment: Faces Faces Pain Scale: Hurts whole lot Pain Location: R knee Pain Descriptors / Indicators: Aching;Grimacing;Guarding Pain Intervention(s): Limited activity within patient's tolerance;Monitored during session;Patient requesting pain meds-RN notified     Hand Dominance  Right   Extremity/Trunk Assessment Upper Extremity Assessment Upper Extremity Assessment: Overall WFL for tasks assessed   Lower Extremity Assessment Lower Extremity Assessment: Defer to PT evaluation   Cervical / Trunk Assessment Cervical / Trunk Assessment: Normal   Communication Communication Communication: No difficulties    Cognition Arousal/Alertness: Awake/alert Behavior During Therapy: WFL for tasks assessed/performed Overall Cognitive Status: Within Functional Limits for tasks assessed                                     General Comments       Exercises     Shoulder Instructions      Home Living Family/patient expects to be discharged to:: Private residence Living Arrangements: Spouse/significant other;Children Available Help at Discharge: Family;Available 24 hours/day Type of Home: House Home Access: Stairs to enter Entergy CorporationEntrance Stairs-Number of Steps: 6 Entrance Stairs-Rails: Right;Left;Can reach both Home Layout: One level     Bathroom Shower/Tub: Producer, television/film/videoWalk-in shower   Bathroom Toilet: Standard     Home Equipment: Environmental consultantWalker - 2 wheels;Bedside commode;Shower seat;Adaptive equipment;Cane - single point Adaptive Equipment: Reacher        Prior Functioning/Environment Level of Independence: Independent        Comments: Mostly independent with occasional use of cane         OT Problem List:        OT Treatment/Interventions:      OT Goals(Current goals can be found in the care plan section) Acute Rehab OT Goals Patient Stated Goal: decrease pain OT Goal Formulation: All assessment and education complete, DC therapy  OT Frequency:     Barriers to D/C:            Co-evaluation              AM-PAC PT "6 Clicks" Daily Activity     Outcome Measure Help from another person eating meals?: None Help from another person taking care of personal grooming?: A Little Help from another person toileting, which includes using toliet, bedpan, or urinal?: A Little Help from another person bathing (including washing, rinsing, drying)?: A Lot Help from another person to put on and taking off regular upper body clothing?: None Help from another person to put on and taking off regular lower body clothing?: A Lot 6 Click Score: 18   End of Session Equipment Utilized During  Treatment: Rolling walker CPM Right Knee CPM Right Knee: Off Nurse Communication: Mobility status;Patient requests pain meds  Activity Tolerance: Patient tolerated treatment well;Patient limited by pain Patient left: in bed;with call bell/phone within reach  OT Visit Diagnosis: Other abnormalities of gait and mobility (R26.89);Pain Pain - Right/Left: Right Pain - part of body: Leg;Knee                Time: [id number] OT Time Calculation (min): 16 min Charges:  OT General Charges $OT Visit: 1 Procedure OT Evaluation $OT Eval Moderate Complexity: 1 Procedure G-Codes:     Sheron Tallman A. Brett Albinooffey, M.S., OTR/L Pager: [phone]  Gaye AlkenBailey A Dayle Mcnerney 12/20/2016, 9:14 AM

## 2016-12-21 LAB — CBC
HCT: 28.7 % — ABNORMAL LOW (ref 39.0–52.0)
Hemoglobin: 9.4 g/dL — ABNORMAL LOW (ref 13.0–17.0)
MCH: 28.7 pg (ref 26.0–34.0)
MCHC: 32.8 g/dL (ref 30.0–36.0)
MCV: 87.5 fL (ref 78.0–100.0)
PLATELETS: 250 10*3/uL (ref 150–400)
RBC: 3.28 MIL/uL — ABNORMAL LOW (ref 4.22–5.81)
RDW: 13.3 % (ref 11.5–15.5)
WBC: 9.8 10*3/uL (ref 4.0–10.5)

## 2016-12-21 MED ORDER — HYDROMORPHONE HCL 1 MG/ML IJ SOLN
2.0000 mg | Freq: Four times a day (QID) | INTRAMUSCULAR | Status: DC | PRN
Start: 2016-12-21 — End: 2016-12-22
  Administered 2016-12-21 – 2016-12-22 (×2): 2 mg via INTRAVENOUS
  Filled 2016-12-21 (×3): qty 2

## 2016-12-21 NOTE — Progress Notes (Signed)
Physical Therapy Treatment Patient Details Name: Mark BankerJames R Rosado MRN: [id number] DOB: [birth year] Today's Date: 12/21/2016    History of Present Illness Pt is 51 y/o male s/p elective R TKA secondary to R knee OA. PMH includes chronic pain with history of opiod addiction, HTN, anxiety/depression, sleep apnea, and s/p L TKA, R THA, and gastric bypass.     PT Comments    Pt performed increased gait and functional mobility during session.  Pt able to stand and void at commode with good static stance.  Pt increased gait distance and reviewed supine exercises in HEP.  Pt remains limited with ROM and guarded to knee flexion.  Plan for HHPT is appropriate.  Will f/u in pm if time permits to progress mobility and ROM.     Follow Up Recommendations  DC plan and follow up therapy as arranged by surgeon;Supervision/Assistance - 24 hour     Equipment Recommendations  None recommended by PT    Recommendations for Other Services       Precautions / Restrictions Precautions Precautions: Knee Precaution Booklet Issued: Yes (comment) Precaution Comments: Reviewed supine ther ex. Continues to be limited secondary to pain.  Required Braces or Orthoses: Knee Immobilizer - Right Knee Immobilizer - Right: Other (comment) (until d/c) Restrictions Weight Bearing Restrictions: Yes RLE Weight Bearing: Weight bearing as tolerated    Mobility  Bed Mobility Overal bed mobility: Needs Assistance Bed Mobility: Supine to Sit;Sit to Supine     Supine to sit: Min assist Sit to supine: Supervision   General bed mobility comments: Pt used hooking method to advance LEs into and out of the bed.  Pt required min assistance to scoot forward R hip to edge of bed.    Transfers Overall transfer level: Needs assistance Equipment used: Rolling walker (2 wheeled) Transfers: Sit to/from Stand Sit to Stand: Min guard         General transfer comment: Cues for hand placement to and from seated surface.  Cues for  forward advancement of R LE to reduce pain and improve ease of transfer.    Ambulation/Gait Ambulation/Gait assistance: Min guard Ambulation Distance (Feet):  (3115ft + 4360ft) Assistive device: Rolling walker (2 wheeled) Gait Pattern/deviations: Step-to pattern;Decreased step length - right;Decreased weight shift to right;Antalgic Gait velocity: decreased Gait velocity interpretation: Below normal speed for age/gender General Gait Details: Cues for L foot clearance and gait symmetry.  Pt slow and guarded and begins to drag L toe when fatigued.     Stairs            Wheelchair Mobility    Modified Rankin (Stroke Patients Only)       Balance Overall balance assessment: Needs assistance Sitting-balance support: No upper extremity supported;Feet supported Sitting balance-Leahy Scale: Good     Standing balance support: No upper extremity supported;During functional activity Standing balance-Leahy Scale: Poor Standing balance comment: Reliant on UE support                             Cognition Arousal/Alertness: Awake/alert Behavior During Therapy: WFL for tasks assessed/performed Overall Cognitive Status: Within Functional Limits for tasks assessed                                        Exercises Total Joint Exercises Ankle Circles/Pumps: AROM;Both;10 reps;Supine Quad Sets: AROM;Right;10 reps;Supine Towel Squeeze: AROM;Both;10 reps;Supine Short Arc  Quad: AROM;Right;10 reps;Supine Heel Slides: AAROM;Right;10 reps;Supine Hip ABduction/ADduction: AAROM;Right;10 reps;Supine Goniometric ROM: grossly 30 degrees flexion R knee    General Comments        Pertinent Vitals/Pain Pain Assessment: 0-10 Pain Score: 8  Pain Location: R knee Pain Descriptors / Indicators: Aching;Grimacing;Guarding;Moaning Pain Intervention(s): Monitored during session;Repositioned;Ice applied    Home Living                      Prior Function             PT Goals (current goals can now be found in the care plan section) Acute Rehab PT Goals Patient Stated Goal: decrease pain Potential to Achieve Goals: Good Progress towards PT goals: Progressing toward goals    Frequency    7X/week      PT Plan Current plan remains appropriate    Co-evaluation              AM-PAC PT "6 Clicks" Daily Activity  Outcome Measure  Difficulty turning over in bed (including adjusting bedclothes, sheets and blankets)?: A Little Difficulty moving from lying on back to sitting on the side of the bed? : Total Difficulty sitting down on and standing up from a chair with arms (e.g., wheelchair, bedside commode, etc,.)?: A Lot Help needed moving to and from a bed to chair (including a wheelchair)?: A Lot Help needed walking in hospital room?: A Little Help needed climbing 3-5 steps with a railing? : A Lot 6 Click Score: 13    End of Session Equipment Utilized During Treatment: Gait belt Activity Tolerance: Patient limited by pain Patient left: in bed;with call bell/phone within reach Nurse Communication: Mobility status PT Visit Diagnosis: Other abnormalities of gait and mobility (R26.89);Pain Pain - Right/Left: Right Pain - part of body: Knee     Time: 1610-9604 PT Time Calculation (min) (ACUTE ONLY): 27 min  Charges:  $Gait Training: 8-22 mins $Therapeutic Exercise: 8-22 mins                    G Codes:       Joycelyn Rua, PTA pager [phone]    Florestine Avers 12/21/2016, 10:13 AM

## 2016-12-21 NOTE — Progress Notes (Signed)
Subjective: 2 Days Post-Op Procedure(s) (LRB): TOTAL KNEE ARTHROPLASTY (Right) Patient reports pain as moderate.    Objective: Vital signs in last 24 hours: Temp:  [98.6 F (37 C)-99.2 F (37.3 C)] 98.6 F (37 C) (07/01 0522) Pulse Rate:  [77-87] 87 (07/01 0522) Resp:  [16-18] 16 (07/01 0522) BP: (171-178)/(87-88) 176/88 (07/01 0522) SpO2:  [90 %-98 %] 96 % (07/01 0522)  Intake/Output from previous day: 06/30 0701 - 07/01 0700 In: 240 [P.O.:240] Out: 800 [Urine:800] Intake/Output this shift: No intake/output data recorded.   Recent Labs  12/20/16 0318 12/21/16 0425  HGB 9.2* 9.4*    Recent Labs  12/20/16 0318 12/21/16 0425  WBC 14.1* 9.8  RBC 3.19* 3.28*  HCT 27.9* 28.7*  PLT 276 250    Recent Labs  12/20/16 0318  NA 133*  K 4.1  CL 102  CO2 26  BUN 8  CREATININE 0.87  GLUCOSE 124*  CALCIUM 8.3*   No results for input(s): LABPT, INR in the last 72 hours.  Neurovascular intact Sensation intact distally Intact pulses distally Dorsiflexion/Plantar flexion intact Incision: dressing C/D/I and scant drainage No cellulitis present Compartment soft  Assessment/Plan: 2 Days Post-Op Procedure(s) (LRB): TOTAL KNEE ARTHROPLASTY (Right) Up with therapy Plan for discharge tomorrow  Cut back on IV dilaudid use wbat  eliquis dvt proph cpm as ordered  Mark [name] 12/21/2016, 8:42 AM

## 2016-12-21 NOTE — Care Management Note (Signed)
Case Management Note  Patient Details  Name: Jennette BankerJames R Humiston MRN: [id number] Date of Birth: Apr 05, 1966  Subjective/Objective:                 [facility] Gerrit Friendsnna George clinical liaison. Lupita LeashDonna notified of DC order today. No further CM needs identified.    Action/Plan:  [facility] Expected Discharge Date:  12/22/16               Expected Discharge Plan:  Home w Home Health Services  In-House Referral:  NA  Discharge planning Services  CM Consult  Post Acute Care Choice:  Home Health Choice offered to:  Patient  DME Arranged:  N/A DME Agency:  NA  HH Arranged:  PT [facility]  Status of Service:  Completed, signed off  If discussed at [employer] Length of Stay Meetings, dates discussed:    Additional Comments:  Lawerance SabalDebbie Deloise Marchant, RN 12/21/2016, 9:10 AM

## 2016-12-21 NOTE — Progress Notes (Signed)
Orthopedic Tech Progress Note Patient Details:  Mark BankerJames R Digeronimo 1965-07-08 [id number]  CPM Right Knee CPM Right Knee: On Right Knee Flexion (Degrees): 90 Right Knee Extension (Degrees): 0 Additional Comments: pt educated on bone foam/refused at this time, placed folder pillow under his ankle to encourage knee extension   Saul FordyceJennifer C Jakhia Buxton 12/21/2016, 2:28 PM

## 2016-12-21 NOTE — Progress Notes (Signed)
Pt's wife requesting information regarding substance abuse and addiction resources. Message left on weekend CSW's voicemail. Will pass information along to night shift RN so week-day CSW can provide information if weekend CSW is unable to provide today. Will continue to monitor

## 2016-12-21 NOTE — Discharge Instructions (Signed)
Information on my medicine - ELIQUIS® (apixaban) ° °This medication education was reviewed with me or my healthcare representative as part of my discharge preparation.  The pharmacist that spoke with me during my hospital stay was:  Sian Rockers P, RPH ° °Why was Eliquis® prescribed for you? °Eliquis® was prescribed for you to reduce the risk of blood clots forming after orthopedic surgery.   ° °What do You need to know about Eliquis®? °Take your Eliquis® TWICE DAILY - one tablet in the morning and one tablet in the evening with or without food.  It would be best to take the dose about the same time each day. ° °If you have difficulty swallowing the tablet whole please discuss with your pharmacist how to take the medication safely. ° °Take Eliquis® exactly as prescribed by your doctor and DO NOT stop taking Eliquis® without talking to the doctor who prescribed the medication.  Stopping without other medication to take the place of Eliquis® may increase your risk of developing a clot. ° °After discharge, you should have regular check-up appointments with your healthcare provider that is prescribing your Eliquis®. ° °What do you do if you miss a dose? °If a dose of ELIQUIS® is not taken at the scheduled time, take it as soon as possible on the same day and twice-daily administration should be resumed.  The dose should not be doubled to make up for a missed dose.  Do not take more than one tablet of ELIQUIS at the same time. ° °Important Safety Information °A possible side effect of Eliquis® is bleeding. You should call your healthcare provider right away if you experience any of the following: °? Bleeding from an injury or your nose that does not stop. °? Unusual colored urine (red or dark brown) or unusual colored stools (red or black). °? Unusual bruising for unknown reasons. °? A serious fall or if you hit your head (even if there is no bleeding). ° °Some medicines may interact with Eliquis® and might increase  your risk of bleeding or clotting while on Eliquis®. To help avoid this, consult your healthcare provider or pharmacist prior to using any new prescription or non-prescription medications, including herbals, vitamins, non-steroidal anti-inflammatory drugs (NSAIDs) and supplements. ° °This website has more information on Eliquis® (apixaban): http://www.eliquis.com/eliquis/home ° °INSTRUCTIONS AFTER JOINT REPLACEMENT  ° °o Remove items at home which could result in a fall. This includes throw rugs or furniture in walking pathways °o ICE to the affected joint every three hours while awake for 30 minutes at a time, for at least the first 3-5 days, and then as needed for pain and swelling.  Continue to use ice for pain and swelling. You may notice swelling that will progress down to the foot and ankle.  This is normal after surgery.  Elevate your leg when you are not up walking on it.   °o Continue to use the breathing machine you got in the hospital (incentive spirometer) which will help keep your temperature down.  It is common for your temperature to cycle up and down following surgery, especially at night when you are not up moving around and exerting yourself.  The breathing machine keeps your lungs expanded and your temperature down. ° ° °DIET:  As you were doing prior to hospitalization, we recommend a well-balanced diet. ° °DRESSING / WOUND CARE / SHOWERING ° °You may change your dressing 3-5 days after surgery.  Then change the dressing every day with sterile gauze.  Please use   good hand washing techniques before changing the dressing.  Do not use any lotions or creams on the incision until instructed by your surgeon. ° °ACTIVITY ° °o Increase activity slowly as tolerated, but follow the weight bearing instructions below.   °o No driving for 6 weeks or until further direction given by your physician.  You cannot drive while taking narcotics.  °o No lifting or carrying greater than 10 lbs. until further directed  by your surgeon. °o Avoid periods of inactivity such as sitting longer than an hour when not asleep. This helps prevent blood clots.  °o You may return to work once you are authorized by your doctor.  ° ° ° °WEIGHT BEARING  ° °Weight bearing as tolerated with assist device (walker, cane, etc) as directed, use it as long as suggested by your surgeon or therapist, typically at least 4-6 weeks. ° ° °EXERCISES ° °Results after joint replacement surgery are often greatly improved when you follow the exercise, range of motion and muscle strengthening exercises prescribed by your doctor. Safety measures are also important to protect the joint from further injury. Any time any of these exercises cause you to have increased pain or swelling, decrease what you are doing until you are comfortable again and then slowly increase them. If you have problems or questions, call your caregiver or physical therapist for advice.  ° °Rehabilitation is important following a joint replacement. After just a few days of immobilization, the muscles of the leg can become weakened and shrink (atrophy).  These exercises are designed to build up the tone and strength of the thigh and leg muscles and to improve motion. Often times heat used for twenty to thirty minutes before working out will loosen up your tissues and help with improving the range of motion but do not use heat for the first two weeks following surgery (sometimes heat can increase post-operative swelling).  ° °These exercises can be done on a training (exercise) mat, on the floor, on a table or on a bed. Use whatever works the best and is most comfortable for you.    Use music or television while you are exercising so that the exercises are a pleasant break in your day. This will make your life better with the exercises acting as a break in your routine that you can look forward to.   Perform all exercises about fifteen times, three times per day or as directed.  You should  exercise both the operative leg and the other leg as well. ° °Exercises include: °  °• Quad Sets - Tighten up the muscle on the front of the thigh (Quad) and hold for 5-10 seconds.   °• Straight Leg Raises - With your knee straight (if you were given a brace, keep it on), lift the leg to 60 degrees, hold for 3 seconds, and slowly lower the leg.  Perform this exercise against resistance later as your leg gets stronger.  °• Leg Slides: Lying on your back, slowly slide your foot toward your buttocks, bending your knee up off the floor (only go as far as is comfortable). Then slowly slide your foot back down until your leg is flat on the floor again.  °• Angel Wings: Lying on your back spread your legs to the side as far apart as you can without causing discomfort.  °• Hamstring Strength:  Lying on your back, push your heel against the floor with your leg straight by tightening up the muscles of your buttocks.    Repeat, but this time bend your knee to a comfortable angle, and push your heel against the floor.  You may put a pillow under the heel to make it more comfortable if necessary.  ° °A rehabilitation program following joint replacement surgery can speed recovery and prevent re-injury in the future due to weakened muscles. Contact your doctor or a physical therapist for more information on knee rehabilitation.  ° ° °CONSTIPATION ° °Constipation is defined medically as fewer than three stools per week and severe constipation as less than one stool per week.  Even if you have a regular bowel pattern at home, your normal regimen is likely to be disrupted due to multiple reasons following surgery.  Combination of anesthesia, postoperative narcotics, change in appetite and fluid intake all can affect your bowels.  ° °YOU MUST use at least one of the following options; they are listed in order of increasing strength to get the job done.  They are all available over the counter, and you may need to use some, POSSIBLY even  all of these options:   ° °Drink plenty of fluids (prune juice may be helpful) and high fiber foods °Colace 100 mg by mouth twice a day  °Senokot for constipation as directed and as needed Dulcolax (bisacodyl), take with full glass of water  °Miralax (polyethylene glycol) once or twice a day as needed. ° °If you have tried all these things and are unable to have a bowel movement in the first 3-4 days after surgery call either your surgeon or your primary doctor.   ° °If you experience loose stools or diarrhea, hold the medications until you stool forms back up.  If your symptoms do not get better within 1 week or if they get worse, check with your doctor.  If you experience "the worst abdominal pain ever" or develop nausea or vomiting, please contact the office immediately for further recommendations for treatment. ° ° °ITCHING:  If you experience itching with your medications, try taking only a single pain pill, or even half a pain pill at a time.  You can also use Benadryl over the counter for itching or also to help with sleep.  ° °TED HOSE STOCKINGS:  Use stockings on both legs until for at least 2 weeks or as directed by physician office. They may be removed at night for sleeping. ° °MEDICATIONS:  See your medication summary on the “After Visit Summary” that nursing will review with you.  You may have some home medications which will be placed on hold until you complete the course of blood thinner medication.  It is important for you to complete the blood thinner medication as prescribed. ° °PRECAUTIONS:  If you experience chest pain or shortness of breath - call 911 immediately for transfer to the hospital emergency department.  ° °If you develop a fever greater that 101 F, purulent drainage from wound, increased redness or drainage from wound, foul odor from the wound/dressing, or calf pain - CONTACT YOUR SURGEON.   °                                                °FOLLOW-UP APPOINTMENTS:  If you do not  already have a post-op appointment, please call the office for an appointment to be seen by your surgeon.  Guidelines for how soon to be seen are   listed in your “After Visit Summary”, but are typically between 1-4 weeks after surgery. ° °OTHER INSTRUCTIONS:  ° °Knee Replacement:  Do not place pillow under knee, focus on keeping the knee straight while resting. CPM instructions: 0-90 degrees, 2 hours in the morning, 2 hours in the afternoon, and 2 hours in the evening. Place foam block, curve side up under heel at all times except when in CPM or when walking.  DO NOT modify, tear, cut, or change the foam block in any way. ° °MAKE SURE YOU:  °• Understand these instructions.  °• Get help right away if you are not doing well or get worse.  ° ° °Thank you for letting us be a part of your medical care team.  It is a privilege we respect greatly.  We hope these instructions will help you stay on track for a fast and full recovery!  ° °

## 2016-12-21 NOTE — Discharge Summary (Signed)
PATIENT ID: Mark [name]        MRN:  [id number]          DOB/AGE: 09-18-1965 / 51 y.o.    DISCHARGE SUMMARY  ADMISSION DATE:    12/19/2016 DISCHARGE DATE:   12/21/2016   ADMISSION DIAGNOSIS: Primary localized osteoarthritis of right knee [M17.11]    DISCHARGE DIAGNOSIS:  OA RIGHT KNEE    ADDITIONAL DIAGNOSIS: Active Problems:   Primary localized osteoarthritis of right knee  Past Medical History:  Diagnosis Date  . Anemia    low iron  . Anxiety   . Astigmatism of left eye   . Depression   . Fatigue   . GERD (gastroesophageal reflux disease)    heartburn occ - better since having gastric bypass  . Headache    migraines - none for years, sinus headaches at times  . Hyperlipemia   . Hypertension    no meds  . Knee pain   . Obesity, unspecified   . Osteoarthritis   . Pneumonia    as child   . Shoulder pain   . Trigger finger (acquired)   . Unspecified sleep apnea    uses cpap    PROCEDURE: Procedure(s): TOTAL KNEE ARTHROPLASTY Right on 12/19/2016  CONSULTS: PT/OT    HISTORY:  See H&P in chart  HOSPITAL COURSE:  Mark [name] is a 51 y.o. admitted on 12/19/2016 and found to have a diagnosis of OA RIGHT KNEE.  After appropriate laboratory studies were obtained  they were taken to the operating room on 12/19/2016 and underwent  Procedure(s): TOTAL KNEE ARTHROPLASTY  Right.   They were given perioperative antibiotics:  Anti-infectives    Start     Dose/Rate Route Frequency Ordered Stop   12/19/16 2000  vancomycin (VANCOCIN) IVPB 1000 mg/200 mL premix     1,000 mg 200 mL/hr over 60 Minutes Intravenous Every 12 hours 12/19/16 1449 12/19/16 2025   12/19/16 0630  vancomycin (VANCOCIN) 1,500 mg in sodium chloride 0.9 % 500 mL IVPB     1,500 mg 250 mL/hr over 120 Minutes Intravenous To [facility] 12/18/16 1241 12/19/16 0750    .  Tolerated the procedure well. Straight cath post op.  POD #1, allowed out of bed to a chair.  PT for ambulation and exercise  program.  IV saline locked.  O2 discontionued.  POD #2, continued PT and ambulation. . Still c/o high pain levels through the night.  The remainder of the hospital course was dedicated to ambulation and strengthening.   The patient was discharged on3 days post op in  Stable condition.  Blood products given:none  DIAGNOSTIC STUDIES: Recent vital signs:  Patient Vitals for the past 24 hrs:  BP Temp Temp src Pulse Resp SpO2  12/21/16 0522 (!) 176/88 98.6 F (37 C) Oral 87 16 96 %  12/20/16 2235 - - - 77 18 98 %  12/20/16 2025 (!) 171/88 98.8 F (37.1 C) Oral 81 16 97 %  12/20/16 1706 (!) 178/87 99.2 F (37.3 C) Oral 86 16 90 %       Recent laboratory studies:  Recent Labs  12/20/16 0318 12/21/16 0425  WBC 14.1* 9.8  HGB 9.2* 9.4*  HCT 27.9* 28.7*  PLT 276 250    Recent Labs  12/20/16 0318  NA 133*  K 4.1  CL 102  CO2 26  BUN 8  CREATININE 0.87  GLUCOSE 124*  CALCIUM 8.3*   Lab Results  Component Value Date  INR 1.01 12/08/2016   INR 1.02 06/27/2016   INR 1.08 07/16/2015     Recent Radiographic Studies :  No results found.  DISCHARGE INSTRUCTIONS:   DISCHARGE MEDICATIONS:   Allergies as of 12/21/2016      Reactions   Asa [aspirin] Other (See Comments)   gastric bypass   Ibuprofen Other (See Comments)   gastric bypass   Penicillins Hives, Other (See Comments)   Has patient had a PCN reaction causing immediate rash, facial/tongue/throat swelling, SOB or lightheadedness with hypotension: Yes Has patient had a PCN reaction causing severe rash involving mucus membranes or skin necrosis: No Has patient had a PCN reaction that required hospitalization unknown "probably not" Has patient had a PCN reaction occurring within the last 10 years: No If all of the above answers are "NO", then may proceed with Cephalosporin use.   Adhesive [tape] Rash, Other (See Comments)   Please use paper tape, can use cloth tape States plastic bandaids irritate   Morphine And  Related Nausea And Vomiting, Other (See Comments)   headaches      Medication List    STOP taking these medications   HYDROmorphone 2 MG tablet Commonly known as:  DILAUDID   ibuprofen 200 MG tablet Commonly known as:  ADVIL,MOTRIN   rivaroxaban 10 MG Tabs tablet Commonly known as:  XARELTO     TAKE these medications   acetaminophen 500 MG tablet Commonly known as:  TYLENOL Take 1,000 mg by mouth every 8 (eight) hours as needed for headache.   apixaban 2.5 MG Tabs tablet Commonly known as:  ELIQUIS Take 1 tablet (2.5 mg total) by mouth 2 (two) times daily.   buPROPion 150 MG 24 hr tablet Commonly known as:  WELLBUTRIN XL Take 150 mg by mouth daily.   cyclobenzaprine 5 MG tablet Commonly known as:  FLEXERIL Take 5 mg by mouth at bedtime.   diclofenac sodium 1 % Gel Commonly known as:  VOLTAREN Apply 1 application topically 4 (four) times daily as needed (pain).   docusate sodium 100 MG capsule Commonly known as:  COLACE Take 1 capsule (100 mg total) by mouth 2 (two) times daily. To prevent constipation while taking pain medication. What changed:  when to take this  reasons to take this  additional instructions   gabapentin 100 MG capsule Commonly known as:  NEURONTIN Take 100 mg by mouth 3 (three) times daily.   guaiFENesin 600 MG 12 hr tablet Commonly known as:  MUCINEX Take 600 mg by mouth 2 (two) times daily as needed for cough or to loosen phlegm.   lamoTRIgine 100 MG tablet Commonly known as:  LAMICTAL Take 100 mg by mouth daily.   levocetirizine 5 MG tablet Commonly known as:  XYZAL Take 5 mg by mouth every evening.   MYRBETRIQ 25 MG Tb24 tablet Generic drug:  mirabegron ER Take 25 mg by mouth daily.   ondansetron 4 MG tablet Commonly known as:  ZOFRAN Take 1 tablet (4 mg total) by mouth every 8 (eight) hours as needed for nausea or vomiting.   oxyCODONE 40 mg 12 hr tablet Commonly known as:  OXYCONTIN Take 1 tablet (40 mg total) by  mouth 2 (two) times daily.   oxycodone 30 MG immediate release tablet Commonly known as:  ROXICODONE Take 1 tablet (30 mg total) by mouth every 4 (four) hours as needed for pain.   oxymetazoline 0.05 % nasal spray Commonly known as:  AFRIN Place 1 spray into both nostrils 4 (four)  times daily as needed (nasal congestion).   PROAIR HFA 108 (90 Base) MCG/ACT inhaler Generic drug:  albuterol Inhale 1-2 puffs into the lungs every 4 (four) hours as needed for wheezing or shortness of breath.   tetrahydrozoline 0.05 % ophthalmic solution Place 1-2 drops into both eyes daily as needed (for dry/irritated eyes).   TRINTELLIX 20 MG Tabs Generic drug:  vortioxetine HBr Take 20 mg by mouth daily.       FOLLOW UP VISIT:   Follow-up Information    Frederico Hamman, MD. Schedule an appointment as soon as possible for a visit in 2 week(s).   Specialty:  Orthopedic Surgery Contact information: [address] [place] [address] [phone]        [facility] Follow up.   Specialty:  Home Health Services Why:  Home Health Physical Therapy Contact information: [address] [place] [address] [phone]           DISPOSITION:   Home  CONDITION:  Stable  Margart Sickles, PA-C  12/21/2016 8:47 AM

## 2016-12-21 NOTE — Progress Notes (Addendum)
Physical Therapy Treatment Patient Details Name: Mark [name] MRN: [id number] DOB: 1966-02-09 Today's Date: 12/21/2016    History of Present Illness Pt is 51 y/o male s/p elective R TKA secondary to R knee OA. PMH includes chronic pain with history of opiod addiction, HTN, anxiety/depression, sleep apnea, and s/p L TKA, R THA, and gastric bypass.     PT Comments    Pt performed increased gait during session.  Pt reports increase in pain during session and complaints of dizziness.  Pt will continue to benefit from review of stair negotiation for safe entry into his home.  Wife and kids present during session.  Plan for review of HEP and stairs next session.     Follow Up Recommendations  DC plan and follow up therapy as arranged by surgeon;Supervision/Assistance - 24 hour     Equipment Recommendations  None recommended by PT    Recommendations for Other Services       Precautions / Restrictions Precautions Precautions: Knee Precaution Booklet Issued: Yes (comment) Precaution Comments: reviewed no objects under knee and placing pillow under ankle to promote knee extension.   Required Braces or Orthoses: Knee Immobilizer - Right Knee Immobilizer - Right: Other (comment) (until d/c.  ) Restrictions Weight Bearing Restrictions: Yes RLE Weight Bearing: Weight bearing as tolerated    Mobility  Bed Mobility Overal bed mobility: Needs Assistance Bed Mobility: Sit to Supine       Sit to supine: Min assist   General bed mobility comments: Pt used hooking method to advance LEs into the bed.  Assist for repositioning in supine.     Transfers Overall transfer level: Needs assistance Equipment used: Rolling walker (2 wheeled) Transfers: Sit to/from Stand Sit to Stand: Min guard         General transfer comment: Cues for hand placement to and from seated surface.  Cues for forward advancement of R LE to reduce pain and improve ease of transfer.     Ambulation/Gait Ambulation/Gait assistance: Min guard Ambulation Distance (Feet): 100 Feet (+20 ft then became dizzy and required chair )   Gait Pattern/deviations: Step-to pattern;Decreased step length - right;Decreased weight shift to right;Antalgic Gait velocity: decreased Gait velocity interpretation: Below normal speed for age/gender General Gait Details: Cues for L foot clearance and gait symmetry.  Pt slow and guarded and begins to drag L toe when fatigued.     Stairs Stairs: Yes   Stair Management: One rail Right;Sideways;Step to pattern Number of Stairs: 2 General stair comments: Cues for sequencing and foot placement.    Wheelchair Mobility    Modified Rankin (Stroke Patients Only)       Balance Overall balance assessment: Needs assistance   Sitting balance-Leahy Scale: Good       Standing balance-Leahy Scale: Poor Standing balance comment: Reliant on UE support                             Cognition Arousal/Alertness: Awake/alert Behavior During Therapy: WFL for tasks assessed/performed Overall Cognitive Status: Within Functional Limits for tasks assessed                                        Exercises      General Comments        Pertinent Vitals/Pain Pain Assessment: 0-10 Pain Score: 8  Pain Location: R knee Pain Descriptors /  Indicators: Aching;Grimacing;Guarding;Moaning Pain Intervention(s): Monitored during session;Repositioned    Home Living                      Prior Function            PT Goals (current goals can now be found in the care plan section) Acute Rehab PT Goals Patient Stated Goal: decrease pain Potential to Achieve Goals: Good Progress towards PT goals: Progressing toward goals    Frequency    7X/week      PT Plan Current plan remains appropriate    Co-evaluation              AM-PAC PT "6 Clicks" Daily Activity  Outcome Measure  Difficulty turning over in bed  (including adjusting bedclothes, sheets and blankets)?: A Little Difficulty moving from lying on back to sitting on the side of the bed? : Total Difficulty sitting down on and standing up from a chair with arms (e.g., wheelchair, bedside commode, etc,.)?: A Lot Help needed moving to and from a bed to chair (including a wheelchair)?: A Lot Help needed walking in hospital room?: A Little Help needed climbing 3-5 steps with a railing? : A Lot 6 Click Score: 13    End of Session Equipment Utilized During Treatment: Gait belt Activity Tolerance: Patient limited by pain Patient left: in bed;with call bell/phone within reach Nurse Communication: Mobility status PT Visit Diagnosis: Other abnormalities of gait and mobility (R26.89);Pain Pain - Right/Left: Right Pain - part of body: Knee     Time: [id number] PT Time Calculation (min) (ACUTE ONLY): 35 min  Charges:  $Gait Training: 8-22 mins $Therapeutic Activity: 8-22 mins                    G Codes:       Joycelyn RuaAimee Thurmond Hildebran, PTA pager [phone]    Florestine Aversimee J Kathlene Yano 12/21/2016, 4:57 PM

## 2016-12-22 ENCOUNTER — Encounter (HOSPITAL_COMMUNITY): Payer: Self-pay | Admitting: Orthopedic Surgery

## 2016-12-22 LAB — CBC
HEMATOCRIT: 29 % — AB (ref 39.0–52.0)
HEMOGLOBIN: 9.3 g/dL — AB (ref 13.0–17.0)
MCH: 28.4 pg (ref 26.0–34.0)
MCHC: 32.1 g/dL (ref 30.0–36.0)
MCV: 88.4 fL (ref 78.0–100.0)
Platelets: 280 10*3/uL (ref 150–400)
RBC: 3.28 MIL/uL — AB (ref 4.22–5.81)
RDW: 13.5 % (ref 11.5–15.5)
WBC: 9.6 10*3/uL (ref 4.0–10.5)

## 2016-12-22 NOTE — Progress Notes (Signed)
Orthopedic Tech Progress Note Patient Details:  Mark [name] 12-23-1965 [id number]  Patient ID: Mark [name], male   DOB: 12-23-1965, 51 y.o.   MRN: [id number] Pt will call when ready for cpm  Trinna PostMartinez, [name] 12/22/2016, 5:51 AM

## 2016-12-22 NOTE — Care Management (Signed)
Case manager spoke with patient and wife concerning discharge plan. Patient and wife had concerns and are very emotional  about his dependency issues. CM referred them to his addiction counselor and provided emotional support and prayer.

## 2016-12-22 NOTE — Progress Notes (Signed)
Placed patient on CPAP for the night.  

## 2016-12-22 NOTE — Progress Notes (Signed)
Physical Therapy Treatment Patient Details Name: Mark [name] MRN: [id number] DOB: [birth year] Today's Date: 12/22/2016    History of Present Illness Pt is 51 y/o male s/p elective R TKA secondary to R knee OA. PMH includes chronic pain with history of opiod addiction, HTN, anxiety/depression, sleep apnea, and s/p L TKA, R THA, and gastric bypass.     PT Comments    Pt performed increased gait, reviewed stairs and reviewed HEP in prep for d/c home.  Plan to d/c home today and informed nurse that he is ready to d/c home from a mobility standpoint.  Pt will continue to benefit from HHPT and support from family at d/c.     Follow Up Recommendations  DC plan and follow up therapy as arranged by surgeon;Supervision/Assistance - 24 hour     Equipment Recommendations  None recommended by PT    Recommendations for Other Services       Precautions / Restrictions Precautions Precautions: Knee Precaution Booklet Issued: Yes (comment) Precaution Comments: reviewed no objects under knee and placing pillow under ankle to promote knee extension.   Required Braces or Orthoses: Knee Immobilizer - Right Knee Immobilizer - Right: Other (comment) (until d/c) Restrictions Weight Bearing Restrictions: Yes RLE Weight Bearing: Weight bearing as tolerated    Mobility  Bed Mobility Overal bed mobility: Needs Assistance Bed Mobility: Supine to Sit;Sit to Supine     Supine to sit: Supervision Sit to supine: Supervision   General bed mobility comments: Pt used hooking method to advance LEs into the bed.  no assistance needed this morning.  Pt with increased time due to pain.    Transfers Overall transfer level: Needs assistance Equipment used: Rolling walker (2 wheeled) Transfers: Sit to/from Stand Sit to Stand: Supervision         General transfer comment: Cues for hand placement to and from seated surface.  Cues for forward advancement of R LE to reduce pain and improve ease of transfer.     Ambulation/Gait Ambulation/Gait assistance: Supervision Ambulation Distance (Feet): 200 Feet Assistive device: Rolling walker (2 wheeled) Gait Pattern/deviations: Step-to pattern;Decreased step length - right;Decreased weight shift to right;Antalgic Gait velocity: decreased Gait velocity interpretation: Below normal speed for age/gender General Gait Details: Cues for L foot clearance and gait symmetry.  Pt slow and guarded and begins to drag L toe when fatigued.     Stairs Stairs: Yes   Stair Management: One rail Right;Sideways;Step to pattern Number of Stairs: 6 General stair comments: Cues for sequencing and foot placement.  Better carryover during session   Wheelchair Mobility    Modified Rankin (Stroke Patients Only)       Balance Overall balance assessment: Needs assistance Sitting-balance support: No upper extremity supported;Feet supported Sitting balance-Leahy Scale: Good     Standing balance support: No upper extremity supported;During functional activity Standing balance-Leahy Scale: Fair Standing balance comment: Reliant on UE support                             Cognition Arousal/Alertness: Awake/alert Behavior During Therapy: WFL for tasks assessed/performed Overall Cognitive Status: Within Functional Limits for tasks assessed                                        Exercises Total Joint Exercises Ankle Circles/Pumps: AROM;Both;10 reps;Supine Quad Sets: AROM;Right;10 reps;Supine Towel Squeeze: AROM;Both;10 reps;Supine Short  Arc Quad: AROM;Right;10 reps;Supine Heel Slides: AAROM;Right;10 reps;Supine Hip ABduction/ADduction: AAROM;Right;10 reps;Supine Straight Leg Raises: AAROM;Right;10 reps;Supine Goniometric ROM: 58 degrees flexion in R knee    General Comments        Pertinent Vitals/Pain Pain Assessment: 0-10 Pain Score: 8  Pain Location: R knee Pain Descriptors / Indicators:  Aching;Grimacing;Guarding;Moaning Pain Intervention(s): Monitored during session;Repositioned;Ice applied    Home Living                      Prior Function            PT Goals (current goals can now be found in the care plan section) Acute Rehab PT Goals Patient Stated Goal: decrease pain Potential to Achieve Goals: Good Progress towards PT goals: Progressing toward goals    Frequency    7X/week      PT Plan Current plan remains appropriate    Co-evaluation              AM-PAC PT "6 Clicks" Daily Activity  Outcome Measure  Difficulty turning over in bed (including adjusting bedclothes, sheets and blankets)?: None Difficulty moving from lying on back to sitting on the side of the bed? : None Difficulty sitting down on and standing up from a chair with arms (e.g., wheelchair, bedside commode, etc,.)?: A Little Help needed moving to and from a bed to chair (including a wheelchair)?: A Little Help needed walking in hospital room?: A Little Help needed climbing 3-5 steps with a railing? : A Little 6 Click Score: 20    End of Session Equipment Utilized During Treatment: Gait belt Activity Tolerance: Patient limited by pain Patient left: in bed;with call bell/phone within reach Nurse Communication: Mobility status PT Visit Diagnosis: Other abnormalities of gait and mobility (R26.89);Pain Pain - Right/Left: Right Pain - part of body: Knee     Time: [id number] PT Time Calculation (min) (ACUTE ONLY): 41 min  Charges:  $Gait Training: 8-22 mins $Therapeutic Exercise: 8-22 mins $Therapeutic Activity: 8-22 mins                    G Codes:       Mark [name], PTA pager [phone]    Mark [name] 12/22/2016, 11:16 AM

## 2017-01-01 ENCOUNTER — Other Ambulatory Visit (HOSPITAL_COMMUNITY): Payer: Self-pay | Admitting: Orthopedic Surgery

## 2017-01-01 ENCOUNTER — Ambulatory Visit (HOSPITAL_COMMUNITY)
Admission: RE | Admit: 2017-01-01 | Discharge: 2017-01-01 | Disposition: A | Discharge status: Home / Self Care | Payer: BLUE CROSS/BLUE SHIELD | Source: Ambulatory Visit | Attending: Internal Medicine | Admitting: Internal Medicine

## 2017-01-01 DIAGNOSIS — M79604 Pain in right leg: Secondary | ICD-10-CM | POA: Diagnosis not present

## 2017-01-01 DIAGNOSIS — R590 Localized enlarged lymph nodes: Secondary | ICD-10-CM | POA: Diagnosis not present

## 2017-01-01 DIAGNOSIS — M7989 Other specified soft tissue disorders: Secondary | ICD-10-CM | POA: Diagnosis present

## 2017-01-01 NOTE — Progress Notes (Signed)
*  PRELIMINARY RESULTS* Vascular Ultrasound Right lower extremity venous duplex has been completed.  Preliminary findings: No evidence of deep vein thrombosis in the visualized vein s of the right lower extremity.  Somewhat difficult exam due to patient body habitus and swelling.  Negative for baker's cyst in the right lower extremity.  Preliminary results called to Dr. Candise Bowensaffrey's office, given to [facility] @ 11:00.   Chauncey FischerCharlotte C Renita Brocks 01/01/2017, 11:03 AM

## 2017-06-19 ENCOUNTER — Other Ambulatory Visit: Payer: Self-pay | Admitting: Orthopedic Surgery

## 2017-06-19 DIAGNOSIS — M545 Low back pain: Secondary | ICD-10-CM

## 2017-07-02 ENCOUNTER — Other Ambulatory Visit: Payer: Self-pay

## 2017-12-14 ENCOUNTER — Other Ambulatory Visit: Payer: Self-pay | Admitting: Orthopedic Surgery

## 2017-12-14 DIAGNOSIS — M5416 Radiculopathy, lumbar region: Secondary | ICD-10-CM

## 2017-12-23 ENCOUNTER — Inpatient Hospital Stay: Admission: RE | Admit: 2017-12-23 | Payer: Self-pay | Source: Ambulatory Visit

## 2017-12-27 ENCOUNTER — Ambulatory Visit
Admission: RE | Admit: 2017-12-27 | Discharge: 2017-12-27 | Disposition: A | Discharge status: Home / Self Care | Payer: BLUE CROSS/BLUE SHIELD | Source: Ambulatory Visit | Attending: Orthopedic Surgery | Admitting: Orthopedic Surgery

## 2017-12-27 DIAGNOSIS — M5416 Radiculopathy, lumbar region: Secondary | ICD-10-CM

## 2018-02-09 DIAGNOSIS — M545 Low back pain: Secondary | ICD-10-CM | POA: Diagnosis not present

## 2018-02-09 DIAGNOSIS — M48061 Spinal stenosis, lumbar region without neurogenic claudication: Secondary | ICD-10-CM | POA: Diagnosis not present

## 2018-02-17 DIAGNOSIS — M545 Low back pain: Secondary | ICD-10-CM | POA: Diagnosis not present

## 2018-02-17 DIAGNOSIS — M48061 Spinal stenosis, lumbar region without neurogenic claudication: Secondary | ICD-10-CM | POA: Diagnosis not present

## 2018-04-22 DIAGNOSIS — M48061 Spinal stenosis, lumbar region without neurogenic claudication: Secondary | ICD-10-CM | POA: Diagnosis not present

## 2018-04-22 DIAGNOSIS — M545 Low back pain: Secondary | ICD-10-CM | POA: Diagnosis not present

## 2018-04-23 DIAGNOSIS — E291 Testicular hypofunction: Secondary | ICD-10-CM | POA: Diagnosis not present

## 2018-04-28 DIAGNOSIS — R7301 Impaired fasting glucose: Secondary | ICD-10-CM | POA: Diagnosis not present

## 2018-04-28 DIAGNOSIS — R600 Localized edema: Secondary | ICD-10-CM | POA: Diagnosis not present

## 2018-04-28 DIAGNOSIS — G894 Chronic pain syndrome: Secondary | ICD-10-CM | POA: Diagnosis not present

## 2018-04-28 DIAGNOSIS — Z23 Encounter for immunization: Secondary | ICD-10-CM | POA: Diagnosis not present

## 2018-04-28 DIAGNOSIS — Z96651 Presence of right artificial knee joint: Secondary | ICD-10-CM | POA: Diagnosis not present

## 2018-04-28 DIAGNOSIS — F1120 Opioid dependence, uncomplicated: Secondary | ICD-10-CM | POA: Diagnosis not present

## 2018-04-28 DIAGNOSIS — E291 Testicular hypofunction: Secondary | ICD-10-CM | POA: Diagnosis not present

## 2018-04-28 DIAGNOSIS — F411 Generalized anxiety disorder: Secondary | ICD-10-CM | POA: Diagnosis not present

## 2018-04-28 DIAGNOSIS — M545 Low back pain: Secondary | ICD-10-CM | POA: Diagnosis not present

## 2018-04-28 DIAGNOSIS — Z7151 Drug abuse counseling and surveillance of drug abuser: Secondary | ICD-10-CM | POA: Diagnosis not present

## 2018-04-28 DIAGNOSIS — M79603 Pain in arm, unspecified: Secondary | ICD-10-CM | POA: Diagnosis not present

## 2018-05-03 DIAGNOSIS — Z7151 Drug abuse counseling and surveillance of drug abuser: Secondary | ICD-10-CM | POA: Diagnosis not present

## 2018-05-03 DIAGNOSIS — F1120 Opioid dependence, uncomplicated: Secondary | ICD-10-CM | POA: Diagnosis not present

## 2018-05-05 DIAGNOSIS — M545 Low back pain: Secondary | ICD-10-CM | POA: Diagnosis not present

## 2018-05-05 DIAGNOSIS — F419 Anxiety disorder, unspecified: Secondary | ICD-10-CM | POA: Diagnosis not present

## 2018-05-05 DIAGNOSIS — M25551 Pain in right hip: Secondary | ICD-10-CM | POA: Diagnosis not present

## 2018-05-05 DIAGNOSIS — F39 Unspecified mood [affective] disorder: Secondary | ICD-10-CM | POA: Diagnosis not present

## 2018-05-05 DIAGNOSIS — Z23 Encounter for immunization: Secondary | ICD-10-CM | POA: Diagnosis not present

## 2018-05-05 DIAGNOSIS — G4730 Sleep apnea, unspecified: Secondary | ICD-10-CM | POA: Diagnosis not present

## 2018-05-05 DIAGNOSIS — Z96659 Presence of unspecified artificial knee joint: Secondary | ICD-10-CM | POA: Diagnosis not present

## 2018-05-05 DIAGNOSIS — E291 Testicular hypofunction: Secondary | ICD-10-CM | POA: Diagnosis not present

## 2018-05-05 DIAGNOSIS — G894 Chronic pain syndrome: Secondary | ICD-10-CM | POA: Diagnosis not present

## 2018-05-10 DIAGNOSIS — F1120 Opioid dependence, uncomplicated: Secondary | ICD-10-CM | POA: Diagnosis not present

## 2018-05-10 DIAGNOSIS — Z7151 Drug abuse counseling and surveillance of drug abuser: Secondary | ICD-10-CM | POA: Diagnosis not present

## 2018-05-17 DIAGNOSIS — Z7151 Drug abuse counseling and surveillance of drug abuser: Secondary | ICD-10-CM | POA: Diagnosis not present

## 2018-05-17 DIAGNOSIS — F1120 Opioid dependence, uncomplicated: Secondary | ICD-10-CM | POA: Diagnosis not present

## 2018-05-19 DIAGNOSIS — Z7151 Drug abuse counseling and surveillance of drug abuser: Secondary | ICD-10-CM | POA: Diagnosis not present

## 2018-05-19 DIAGNOSIS — F1120 Opioid dependence, uncomplicated: Secondary | ICD-10-CM | POA: Diagnosis not present

## 2018-05-24 DIAGNOSIS — F1120 Opioid dependence, uncomplicated: Secondary | ICD-10-CM | POA: Diagnosis not present

## 2018-05-24 DIAGNOSIS — Z7151 Drug abuse counseling and surveillance of drug abuser: Secondary | ICD-10-CM | POA: Diagnosis not present

## 2018-05-26 DIAGNOSIS — E291 Testicular hypofunction: Secondary | ICD-10-CM | POA: Diagnosis not present

## 2018-06-07 DIAGNOSIS — F1120 Opioid dependence, uncomplicated: Secondary | ICD-10-CM | POA: Diagnosis not present

## 2018-06-07 DIAGNOSIS — Z7151 Drug abuse counseling and surveillance of drug abuser: Secondary | ICD-10-CM | POA: Diagnosis not present

## 2018-06-09 DIAGNOSIS — E291 Testicular hypofunction: Secondary | ICD-10-CM | POA: Diagnosis not present

## 2018-06-09 DIAGNOSIS — F1120 Opioid dependence, uncomplicated: Secondary | ICD-10-CM | POA: Diagnosis not present

## 2018-06-09 DIAGNOSIS — Z7151 Drug abuse counseling and surveillance of drug abuser: Secondary | ICD-10-CM | POA: Diagnosis not present

## 2018-06-14 DIAGNOSIS — Z7151 Drug abuse counseling and surveillance of drug abuser: Secondary | ICD-10-CM | POA: Diagnosis not present

## 2018-06-14 DIAGNOSIS — F1120 Opioid dependence, uncomplicated: Secondary | ICD-10-CM | POA: Diagnosis not present

## 2018-06-23 DIAGNOSIS — M545 Low back pain: Secondary | ICD-10-CM | POA: Diagnosis not present

## 2018-06-23 DIAGNOSIS — Z7151 Drug abuse counseling and surveillance of drug abuser: Secondary | ICD-10-CM | POA: Diagnosis not present

## 2018-06-23 DIAGNOSIS — F1120 Opioid dependence, uncomplicated: Secondary | ICD-10-CM | POA: Diagnosis not present

## 2018-06-28 DIAGNOSIS — Z7151 Drug abuse counseling and surveillance of drug abuser: Secondary | ICD-10-CM | POA: Diagnosis not present

## 2018-06-28 DIAGNOSIS — F1120 Opioid dependence, uncomplicated: Secondary | ICD-10-CM | POA: Diagnosis not present

## 2018-06-30 DIAGNOSIS — Z7151 Drug abuse counseling and surveillance of drug abuser: Secondary | ICD-10-CM | POA: Diagnosis not present

## 2018-06-30 DIAGNOSIS — F1120 Opioid dependence, uncomplicated: Secondary | ICD-10-CM | POA: Diagnosis not present

## 2018-07-07 DIAGNOSIS — Z7151 Drug abuse counseling and surveillance of drug abuser: Secondary | ICD-10-CM | POA: Diagnosis not present

## 2018-07-07 DIAGNOSIS — E291 Testicular hypofunction: Secondary | ICD-10-CM | POA: Diagnosis not present

## 2018-07-07 DIAGNOSIS — F1120 Opioid dependence, uncomplicated: Secondary | ICD-10-CM | POA: Diagnosis not present

## 2018-07-12 DIAGNOSIS — M545 Low back pain: Secondary | ICD-10-CM | POA: Diagnosis not present

## 2018-07-12 DIAGNOSIS — M48061 Spinal stenosis, lumbar region without neurogenic claudication: Secondary | ICD-10-CM | POA: Diagnosis not present

## 2018-07-12 DIAGNOSIS — F1120 Opioid dependence, uncomplicated: Secondary | ICD-10-CM | POA: Diagnosis not present

## 2018-07-12 DIAGNOSIS — Z7151 Drug abuse counseling and surveillance of drug abuser: Secondary | ICD-10-CM | POA: Diagnosis not present

## 2018-07-14 DIAGNOSIS — Z7151 Drug abuse counseling and surveillance of drug abuser: Secondary | ICD-10-CM | POA: Diagnosis not present

## 2018-07-14 DIAGNOSIS — F1120 Opioid dependence, uncomplicated: Secondary | ICD-10-CM | POA: Diagnosis not present

## 2018-07-19 DIAGNOSIS — F1120 Opioid dependence, uncomplicated: Secondary | ICD-10-CM | POA: Diagnosis not present

## 2018-07-19 DIAGNOSIS — Z7151 Drug abuse counseling and surveillance of drug abuser: Secondary | ICD-10-CM | POA: Diagnosis not present

## 2018-07-21 DIAGNOSIS — Z7151 Drug abuse counseling and surveillance of drug abuser: Secondary | ICD-10-CM | POA: Diagnosis not present

## 2018-07-21 DIAGNOSIS — F1120 Opioid dependence, uncomplicated: Secondary | ICD-10-CM | POA: Diagnosis not present

## 2018-07-23 DIAGNOSIS — E291 Testicular hypofunction: Secondary | ICD-10-CM | POA: Diagnosis not present

## 2018-07-26 DIAGNOSIS — F1120 Opioid dependence, uncomplicated: Secondary | ICD-10-CM | POA: Diagnosis not present

## 2018-07-26 DIAGNOSIS — Z7151 Drug abuse counseling and surveillance of drug abuser: Secondary | ICD-10-CM | POA: Diagnosis not present

## 2018-07-28 DIAGNOSIS — F1120 Opioid dependence, uncomplicated: Secondary | ICD-10-CM | POA: Diagnosis not present

## 2018-07-28 DIAGNOSIS — Z7151 Drug abuse counseling and surveillance of drug abuser: Secondary | ICD-10-CM | POA: Diagnosis not present

## 2018-08-03 DIAGNOSIS — M48061 Spinal stenosis, lumbar region without neurogenic claudication: Secondary | ICD-10-CM | POA: Diagnosis not present

## 2018-08-03 DIAGNOSIS — M545 Low back pain: Secondary | ICD-10-CM | POA: Diagnosis not present

## 2018-08-04 DIAGNOSIS — Z7151 Drug abuse counseling and surveillance of drug abuser: Secondary | ICD-10-CM | POA: Diagnosis not present

## 2018-08-04 DIAGNOSIS — F1120 Opioid dependence, uncomplicated: Secondary | ICD-10-CM | POA: Diagnosis not present

## 2018-08-09 DIAGNOSIS — F1120 Opioid dependence, uncomplicated: Secondary | ICD-10-CM | POA: Diagnosis not present

## 2018-08-09 DIAGNOSIS — Z7151 Drug abuse counseling and surveillance of drug abuser: Secondary | ICD-10-CM | POA: Diagnosis not present

## 2018-08-11 DIAGNOSIS — F1120 Opioid dependence, uncomplicated: Secondary | ICD-10-CM | POA: Diagnosis not present

## 2018-08-11 DIAGNOSIS — Z7151 Drug abuse counseling and surveillance of drug abuser: Secondary | ICD-10-CM | POA: Diagnosis not present

## 2018-08-12 DIAGNOSIS — E291 Testicular hypofunction: Secondary | ICD-10-CM | POA: Diagnosis not present

## 2018-08-16 DIAGNOSIS — F1120 Opioid dependence, uncomplicated: Secondary | ICD-10-CM | POA: Diagnosis not present

## 2018-08-16 DIAGNOSIS — Z7151 Drug abuse counseling and surveillance of drug abuser: Secondary | ICD-10-CM | POA: Diagnosis not present

## 2018-08-23 DIAGNOSIS — F1120 Opioid dependence, uncomplicated: Secondary | ICD-10-CM | POA: Diagnosis not present

## 2018-08-23 DIAGNOSIS — Z7151 Drug abuse counseling and surveillance of drug abuser: Secondary | ICD-10-CM | POA: Diagnosis not present

## 2018-08-25 DIAGNOSIS — F1120 Opioid dependence, uncomplicated: Secondary | ICD-10-CM | POA: Diagnosis not present

## 2018-08-25 DIAGNOSIS — Z7151 Drug abuse counseling and surveillance of drug abuser: Secondary | ICD-10-CM | POA: Diagnosis not present

## 2018-08-30 DIAGNOSIS — Z7151 Drug abuse counseling and surveillance of drug abuser: Secondary | ICD-10-CM | POA: Diagnosis not present

## 2018-08-30 DIAGNOSIS — F1120 Opioid dependence, uncomplicated: Secondary | ICD-10-CM | POA: Diagnosis not present

## 2018-08-31 DIAGNOSIS — M4716 Other spondylosis with myelopathy, lumbar region: Secondary | ICD-10-CM | POA: Diagnosis not present

## 2018-08-31 DIAGNOSIS — M5416 Radiculopathy, lumbar region: Secondary | ICD-10-CM | POA: Diagnosis not present

## 2018-08-31 DIAGNOSIS — M5136 Other intervertebral disc degeneration, lumbar region: Secondary | ICD-10-CM | POA: Diagnosis not present

## 2018-08-31 DIAGNOSIS — M545 Low back pain: Secondary | ICD-10-CM | POA: Diagnosis not present

## 2018-09-01 DIAGNOSIS — Z7151 Drug abuse counseling and surveillance of drug abuser: Secondary | ICD-10-CM | POA: Diagnosis not present

## 2018-09-01 DIAGNOSIS — F1120 Opioid dependence, uncomplicated: Secondary | ICD-10-CM | POA: Diagnosis not present

## 2018-09-05 DIAGNOSIS — Z7151 Drug abuse counseling and surveillance of drug abuser: Secondary | ICD-10-CM | POA: Diagnosis not present

## 2018-09-05 DIAGNOSIS — F1120 Opioid dependence, uncomplicated: Secondary | ICD-10-CM | POA: Diagnosis not present

## 2018-09-07 ENCOUNTER — Other Ambulatory Visit: Payer: Self-pay | Admitting: Rehabilitation

## 2018-09-07 DIAGNOSIS — E291 Testicular hypofunction: Secondary | ICD-10-CM | POA: Diagnosis not present

## 2018-09-07 DIAGNOSIS — M542 Cervicalgia: Secondary | ICD-10-CM

## 2018-09-08 DIAGNOSIS — G894 Chronic pain syndrome: Secondary | ICD-10-CM | POA: Diagnosis not present

## 2018-09-08 DIAGNOSIS — Z7151 Drug abuse counseling and surveillance of drug abuser: Secondary | ICD-10-CM | POA: Diagnosis not present

## 2018-09-08 DIAGNOSIS — F1120 Opioid dependence, uncomplicated: Secondary | ICD-10-CM | POA: Diagnosis not present

## 2018-09-15 ENCOUNTER — Other Ambulatory Visit: Payer: Self-pay

## 2018-09-15 DIAGNOSIS — Z7151 Drug abuse counseling and surveillance of drug abuser: Secondary | ICD-10-CM | POA: Diagnosis not present

## 2018-09-15 DIAGNOSIS — F1120 Opioid dependence, uncomplicated: Secondary | ICD-10-CM | POA: Diagnosis not present

## 2018-09-18 DIAGNOSIS — G894 Chronic pain syndrome: Secondary | ICD-10-CM | POA: Diagnosis not present

## 2018-09-22 DIAGNOSIS — F1120 Opioid dependence, uncomplicated: Secondary | ICD-10-CM | POA: Diagnosis not present

## 2018-09-22 DIAGNOSIS — E291 Testicular hypofunction: Secondary | ICD-10-CM | POA: Diagnosis not present

## 2018-09-22 DIAGNOSIS — Z7151 Drug abuse counseling and surveillance of drug abuser: Secondary | ICD-10-CM | POA: Diagnosis not present

## 2018-09-25 DIAGNOSIS — F1120 Opioid dependence, uncomplicated: Secondary | ICD-10-CM | POA: Diagnosis not present

## 2018-09-29 DIAGNOSIS — Z7151 Drug abuse counseling and surveillance of drug abuser: Secondary | ICD-10-CM | POA: Diagnosis not present

## 2018-09-29 DIAGNOSIS — F1120 Opioid dependence, uncomplicated: Secondary | ICD-10-CM | POA: Diagnosis not present

## 2018-10-02 DIAGNOSIS — F1120 Opioid dependence, uncomplicated: Secondary | ICD-10-CM | POA: Diagnosis not present

## 2018-10-02 DIAGNOSIS — M545 Low back pain: Secondary | ICD-10-CM | POA: Diagnosis not present

## 2018-10-02 DIAGNOSIS — G894 Chronic pain syndrome: Secondary | ICD-10-CM | POA: Diagnosis not present

## 2018-10-05 ENCOUNTER — Other Ambulatory Visit: Payer: Self-pay

## 2018-10-05 ENCOUNTER — Ambulatory Visit
Admission: RE | Admit: 2018-10-05 | Discharge: 2018-10-05 | Disposition: A | Discharge status: Home / Self Care | Payer: Medicare HMO | Source: Ambulatory Visit | Attending: Rehabilitation | Admitting: Rehabilitation

## 2018-10-05 DIAGNOSIS — M4802 Spinal stenosis, cervical region: Secondary | ICD-10-CM | POA: Diagnosis not present

## 2018-10-05 DIAGNOSIS — M542 Cervicalgia: Secondary | ICD-10-CM

## 2018-10-06 DIAGNOSIS — F1120 Opioid dependence, uncomplicated: Secondary | ICD-10-CM | POA: Diagnosis not present

## 2018-10-06 DIAGNOSIS — E291 Testicular hypofunction: Secondary | ICD-10-CM | POA: Diagnosis not present

## 2018-10-06 DIAGNOSIS — Z7151 Drug abuse counseling and surveillance of drug abuser: Secondary | ICD-10-CM | POA: Diagnosis not present

## 2018-10-06 DIAGNOSIS — M545 Low back pain: Secondary | ICD-10-CM | POA: Diagnosis not present

## 2018-10-06 DIAGNOSIS — G894 Chronic pain syndrome: Secondary | ICD-10-CM | POA: Diagnosis not present

## 2018-10-11 DIAGNOSIS — Z7151 Drug abuse counseling and surveillance of drug abuser: Secondary | ICD-10-CM | POA: Diagnosis not present

## 2018-10-11 DIAGNOSIS — F1120 Opioid dependence, uncomplicated: Secondary | ICD-10-CM | POA: Diagnosis not present

## 2018-10-13 DIAGNOSIS — F1120 Opioid dependence, uncomplicated: Secondary | ICD-10-CM | POA: Diagnosis not present

## 2018-10-13 DIAGNOSIS — Z7151 Drug abuse counseling and surveillance of drug abuser: Secondary | ICD-10-CM | POA: Diagnosis not present

## 2018-10-14 DIAGNOSIS — M4010 Other secondary kyphosis, site unspecified: Secondary | ICD-10-CM | POA: Diagnosis not present

## 2018-10-14 DIAGNOSIS — M4316 Spondylolisthesis, lumbar region: Secondary | ICD-10-CM | POA: Diagnosis not present

## 2018-10-14 DIAGNOSIS — M48062 Spinal stenosis, lumbar region with neurogenic claudication: Secondary | ICD-10-CM | POA: Diagnosis not present

## 2018-10-14 DIAGNOSIS — M4150 Other secondary scoliosis, site unspecified: Secondary | ICD-10-CM | POA: Diagnosis not present

## 2018-10-18 DIAGNOSIS — Z0000 Encounter for general adult medical examination without abnormal findings: Secondary | ICD-10-CM | POA: Diagnosis not present

## 2018-10-19 ENCOUNTER — Other Ambulatory Visit: Payer: Self-pay | Admitting: Orthopaedic Surgery

## 2018-10-19 DIAGNOSIS — M4010 Other secondary kyphosis, site unspecified: Secondary | ICD-10-CM

## 2018-10-19 DIAGNOSIS — M48062 Spinal stenosis, lumbar region with neurogenic claudication: Secondary | ICD-10-CM

## 2018-10-20 DIAGNOSIS — F1120 Opioid dependence, uncomplicated: Secondary | ICD-10-CM | POA: Diagnosis not present

## 2018-10-20 DIAGNOSIS — Z7151 Drug abuse counseling and surveillance of drug abuser: Secondary | ICD-10-CM | POA: Diagnosis not present

## 2018-10-23 DIAGNOSIS — F1120 Opioid dependence, uncomplicated: Secondary | ICD-10-CM | POA: Diagnosis not present

## 2018-10-25 DIAGNOSIS — F1120 Opioid dependence, uncomplicated: Secondary | ICD-10-CM | POA: Diagnosis not present

## 2018-10-25 DIAGNOSIS — Z7151 Drug abuse counseling and surveillance of drug abuser: Secondary | ICD-10-CM | POA: Diagnosis not present

## 2018-10-27 DIAGNOSIS — F1120 Opioid dependence, uncomplicated: Secondary | ICD-10-CM | POA: Diagnosis not present

## 2018-10-27 DIAGNOSIS — Z7151 Drug abuse counseling and surveillance of drug abuser: Secondary | ICD-10-CM | POA: Diagnosis not present

## 2018-10-30 DIAGNOSIS — F1120 Opioid dependence, uncomplicated: Secondary | ICD-10-CM | POA: Diagnosis not present

## 2018-11-02 ENCOUNTER — Other Ambulatory Visit: Payer: Self-pay | Admitting: Orthopaedic Surgery

## 2018-11-02 DIAGNOSIS — M4010 Other secondary kyphosis, site unspecified: Secondary | ICD-10-CM

## 2018-11-02 DIAGNOSIS — M48062 Spinal stenosis, lumbar region with neurogenic claudication: Secondary | ICD-10-CM

## 2018-11-03 DIAGNOSIS — F1120 Opioid dependence, uncomplicated: Secondary | ICD-10-CM | POA: Diagnosis not present

## 2018-11-03 DIAGNOSIS — Z7151 Drug abuse counseling and surveillance of drug abuser: Secondary | ICD-10-CM | POA: Diagnosis not present

## 2018-11-04 ENCOUNTER — Other Ambulatory Visit: Payer: Self-pay

## 2018-11-04 ENCOUNTER — Ambulatory Visit
Admission: RE | Admit: 2018-11-04 | Discharge: 2018-11-04 | Disposition: A | Discharge status: Home / Self Care | Payer: Medicare HMO | Source: Ambulatory Visit | Attending: Orthopaedic Surgery | Admitting: Orthopaedic Surgery

## 2018-11-04 DIAGNOSIS — M546 Pain in thoracic spine: Secondary | ICD-10-CM | POA: Diagnosis not present

## 2018-11-04 DIAGNOSIS — M545 Low back pain: Secondary | ICD-10-CM | POA: Diagnosis not present

## 2018-11-04 DIAGNOSIS — M48062 Spinal stenosis, lumbar region with neurogenic claudication: Secondary | ICD-10-CM

## 2018-11-04 DIAGNOSIS — M4010 Other secondary kyphosis, site unspecified: Secondary | ICD-10-CM

## 2018-11-04 DIAGNOSIS — E291 Testicular hypofunction: Secondary | ICD-10-CM | POA: Diagnosis not present

## 2018-11-06 DIAGNOSIS — G894 Chronic pain syndrome: Secondary | ICD-10-CM | POA: Diagnosis not present

## 2018-11-06 DIAGNOSIS — M545 Low back pain: Secondary | ICD-10-CM | POA: Diagnosis not present

## 2018-11-08 ENCOUNTER — Other Ambulatory Visit: Payer: Self-pay

## 2018-11-10 DIAGNOSIS — G894 Chronic pain syndrome: Secondary | ICD-10-CM | POA: Diagnosis not present

## 2018-11-10 DIAGNOSIS — D638 Anemia in other chronic diseases classified elsewhere: Secondary | ICD-10-CM | POA: Diagnosis not present

## 2018-11-10 DIAGNOSIS — N529 Male erectile dysfunction, unspecified: Secondary | ICD-10-CM | POA: Diagnosis not present

## 2018-11-10 DIAGNOSIS — R7301 Impaired fasting glucose: Secondary | ICD-10-CM | POA: Diagnosis not present

## 2018-11-10 DIAGNOSIS — Z7151 Drug abuse counseling and surveillance of drug abuser: Secondary | ICD-10-CM | POA: Diagnosis not present

## 2018-11-10 DIAGNOSIS — D509 Iron deficiency anemia, unspecified: Secondary | ICD-10-CM | POA: Diagnosis not present

## 2018-11-10 DIAGNOSIS — M545 Low back pain: Secondary | ICD-10-CM | POA: Diagnosis not present

## 2018-11-10 DIAGNOSIS — F1120 Opioid dependence, uncomplicated: Secondary | ICD-10-CM | POA: Diagnosis not present

## 2018-11-10 DIAGNOSIS — E291 Testicular hypofunction: Secondary | ICD-10-CM | POA: Diagnosis not present

## 2018-11-11 DIAGNOSIS — M5136 Other intervertebral disc degeneration, lumbar region: Secondary | ICD-10-CM | POA: Diagnosis not present

## 2018-11-11 DIAGNOSIS — M4150 Other secondary scoliosis, site unspecified: Secondary | ICD-10-CM | POA: Diagnosis not present

## 2018-11-11 DIAGNOSIS — M545 Low back pain: Secondary | ICD-10-CM | POA: Diagnosis not present

## 2018-11-11 DIAGNOSIS — M4010 Other secondary kyphosis, site unspecified: Secondary | ICD-10-CM | POA: Diagnosis not present

## 2018-11-13 DIAGNOSIS — F1120 Opioid dependence, uncomplicated: Secondary | ICD-10-CM | POA: Diagnosis not present

## 2018-11-13 DIAGNOSIS — M545 Low back pain: Secondary | ICD-10-CM | POA: Diagnosis not present

## 2018-11-13 DIAGNOSIS — G894 Chronic pain syndrome: Secondary | ICD-10-CM | POA: Diagnosis not present

## 2018-11-17 DIAGNOSIS — F1120 Opioid dependence, uncomplicated: Secondary | ICD-10-CM | POA: Diagnosis not present

## 2018-11-17 DIAGNOSIS — G894 Chronic pain syndrome: Secondary | ICD-10-CM | POA: Diagnosis not present

## 2018-11-17 DIAGNOSIS — Z7151 Drug abuse counseling and surveillance of drug abuser: Secondary | ICD-10-CM | POA: Diagnosis not present

## 2018-11-17 DIAGNOSIS — M545 Low back pain: Secondary | ICD-10-CM | POA: Diagnosis not present

## 2018-11-18 DIAGNOSIS — Z96659 Presence of unspecified artificial knee joint: Secondary | ICD-10-CM | POA: Diagnosis not present

## 2018-11-18 DIAGNOSIS — G894 Chronic pain syndrome: Secondary | ICD-10-CM | POA: Diagnosis not present

## 2018-11-18 DIAGNOSIS — M25551 Pain in right hip: Secondary | ICD-10-CM | POA: Diagnosis not present

## 2018-11-18 DIAGNOSIS — E291 Testicular hypofunction: Secondary | ICD-10-CM | POA: Diagnosis not present

## 2018-11-18 DIAGNOSIS — F39 Unspecified mood [affective] disorder: Secondary | ICD-10-CM | POA: Diagnosis not present

## 2018-11-18 DIAGNOSIS — M545 Low back pain: Secondary | ICD-10-CM | POA: Diagnosis not present

## 2018-11-18 DIAGNOSIS — G4730 Sleep apnea, unspecified: Secondary | ICD-10-CM | POA: Diagnosis not present

## 2018-11-18 DIAGNOSIS — F419 Anxiety disorder, unspecified: Secondary | ICD-10-CM | POA: Diagnosis not present

## 2018-11-18 DIAGNOSIS — R7301 Impaired fasting glucose: Secondary | ICD-10-CM | POA: Diagnosis not present

## 2018-11-22 DIAGNOSIS — F1120 Opioid dependence, uncomplicated: Secondary | ICD-10-CM | POA: Diagnosis not present

## 2018-11-22 DIAGNOSIS — G894 Chronic pain syndrome: Secondary | ICD-10-CM | POA: Diagnosis not present

## 2018-11-24 DIAGNOSIS — F1120 Opioid dependence, uncomplicated: Secondary | ICD-10-CM | POA: Diagnosis not present

## 2018-11-24 DIAGNOSIS — G894 Chronic pain syndrome: Secondary | ICD-10-CM | POA: Diagnosis not present

## 2018-11-27 DIAGNOSIS — F1120 Opioid dependence, uncomplicated: Secondary | ICD-10-CM | POA: Diagnosis not present

## 2018-11-27 DIAGNOSIS — G894 Chronic pain syndrome: Secondary | ICD-10-CM | POA: Diagnosis not present

## 2018-11-29 DIAGNOSIS — F1120 Opioid dependence, uncomplicated: Secondary | ICD-10-CM | POA: Diagnosis not present

## 2018-11-29 DIAGNOSIS — Z7151 Drug abuse counseling and surveillance of drug abuser: Secondary | ICD-10-CM | POA: Diagnosis not present

## 2018-12-01 DIAGNOSIS — F1120 Opioid dependence, uncomplicated: Secondary | ICD-10-CM | POA: Diagnosis not present

## 2018-12-01 DIAGNOSIS — G894 Chronic pain syndrome: Secondary | ICD-10-CM | POA: Diagnosis not present

## 2018-12-04 DIAGNOSIS — F1120 Opioid dependence, uncomplicated: Secondary | ICD-10-CM | POA: Diagnosis not present

## 2018-12-04 DIAGNOSIS — G894 Chronic pain syndrome: Secondary | ICD-10-CM | POA: Diagnosis not present

## 2018-12-08 DIAGNOSIS — F1120 Opioid dependence, uncomplicated: Secondary | ICD-10-CM | POA: Diagnosis not present

## 2018-12-08 DIAGNOSIS — G894 Chronic pain syndrome: Secondary | ICD-10-CM | POA: Diagnosis not present

## 2018-12-13 DIAGNOSIS — F1120 Opioid dependence, uncomplicated: Secondary | ICD-10-CM | POA: Diagnosis not present

## 2018-12-13 DIAGNOSIS — Z7151 Drug abuse counseling and surveillance of drug abuser: Secondary | ICD-10-CM | POA: Diagnosis not present

## 2018-12-15 DIAGNOSIS — G894 Chronic pain syndrome: Secondary | ICD-10-CM | POA: Diagnosis not present

## 2018-12-15 DIAGNOSIS — E291 Testicular hypofunction: Secondary | ICD-10-CM | POA: Diagnosis not present

## 2018-12-15 DIAGNOSIS — F1120 Opioid dependence, uncomplicated: Secondary | ICD-10-CM | POA: Diagnosis not present

## 2018-12-18 DIAGNOSIS — G894 Chronic pain syndrome: Secondary | ICD-10-CM | POA: Diagnosis not present

## 2018-12-18 DIAGNOSIS — F1120 Opioid dependence, uncomplicated: Secondary | ICD-10-CM | POA: Diagnosis not present

## 2018-12-20 DIAGNOSIS — F1120 Opioid dependence, uncomplicated: Secondary | ICD-10-CM | POA: Diagnosis not present

## 2018-12-22 DIAGNOSIS — G894 Chronic pain syndrome: Secondary | ICD-10-CM | POA: Diagnosis not present

## 2018-12-22 DIAGNOSIS — F1120 Opioid dependence, uncomplicated: Secondary | ICD-10-CM | POA: Diagnosis not present

## 2018-12-25 DIAGNOSIS — F1120 Opioid dependence, uncomplicated: Secondary | ICD-10-CM | POA: Diagnosis not present

## 2018-12-25 DIAGNOSIS — M545 Low back pain: Secondary | ICD-10-CM | POA: Diagnosis not present

## 2018-12-27 DIAGNOSIS — F1120 Opioid dependence, uncomplicated: Secondary | ICD-10-CM | POA: Diagnosis not present

## 2018-12-29 DIAGNOSIS — F1120 Opioid dependence, uncomplicated: Secondary | ICD-10-CM | POA: Diagnosis not present

## 2018-12-29 DIAGNOSIS — G894 Chronic pain syndrome: Secondary | ICD-10-CM | POA: Diagnosis not present

## 2018-12-29 DIAGNOSIS — M545 Low back pain: Secondary | ICD-10-CM | POA: Diagnosis not present

## 2019-01-03 DIAGNOSIS — M4316 Spondylolisthesis, lumbar region: Secondary | ICD-10-CM | POA: Diagnosis not present

## 2019-01-03 DIAGNOSIS — Z1159 Encounter for screening for other viral diseases: Secondary | ICD-10-CM | POA: Diagnosis not present

## 2019-01-03 DIAGNOSIS — Z01812 Encounter for preprocedural laboratory examination: Secondary | ICD-10-CM | POA: Diagnosis not present

## 2019-01-03 DIAGNOSIS — F1120 Opioid dependence, uncomplicated: Secondary | ICD-10-CM | POA: Diagnosis not present

## 2019-01-03 DIAGNOSIS — G992 Myelopathy in diseases classified elsewhere: Secondary | ICD-10-CM | POA: Diagnosis not present

## 2019-01-04 DIAGNOSIS — G4730 Sleep apnea, unspecified: Secondary | ICD-10-CM | POA: Diagnosis not present

## 2019-01-04 DIAGNOSIS — Z01810 Encounter for preprocedural cardiovascular examination: Secondary | ICD-10-CM | POA: Diagnosis not present

## 2019-01-04 DIAGNOSIS — M48062 Spinal stenosis, lumbar region with neurogenic claudication: Secondary | ICD-10-CM | POA: Diagnosis not present

## 2019-01-04 DIAGNOSIS — M5136 Other intervertebral disc degeneration, lumbar region: Secondary | ICD-10-CM | POA: Diagnosis not present

## 2019-01-04 DIAGNOSIS — M545 Low back pain: Secondary | ICD-10-CM | POA: Diagnosis not present

## 2019-01-04 DIAGNOSIS — Z79899 Other long term (current) drug therapy: Secondary | ICD-10-CM | POA: Diagnosis not present

## 2019-01-04 DIAGNOSIS — M4010 Other secondary kyphosis, site unspecified: Secondary | ICD-10-CM | POA: Diagnosis not present

## 2019-01-04 DIAGNOSIS — M4716 Other spondylosis with myelopathy, lumbar region: Secondary | ICD-10-CM | POA: Diagnosis not present

## 2019-01-04 DIAGNOSIS — M4150 Other secondary scoliosis, site unspecified: Secondary | ICD-10-CM | POA: Diagnosis not present

## 2019-01-04 DIAGNOSIS — M5416 Radiculopathy, lumbar region: Secondary | ICD-10-CM | POA: Diagnosis not present

## 2019-01-05 DIAGNOSIS — F1120 Opioid dependence, uncomplicated: Secondary | ICD-10-CM | POA: Diagnosis not present

## 2019-01-05 DIAGNOSIS — G894 Chronic pain syndrome: Secondary | ICD-10-CM | POA: Diagnosis not present

## 2019-01-08 DIAGNOSIS — G894 Chronic pain syndrome: Secondary | ICD-10-CM | POA: Diagnosis not present

## 2019-01-08 DIAGNOSIS — F1120 Opioid dependence, uncomplicated: Secondary | ICD-10-CM | POA: Diagnosis not present

## 2019-01-10 DIAGNOSIS — M4716 Other spondylosis with myelopathy, lumbar region: Secondary | ICD-10-CM | POA: Diagnosis not present

## 2019-01-10 DIAGNOSIS — M4010 Other secondary kyphosis, site unspecified: Secondary | ICD-10-CM | POA: Diagnosis not present

## 2019-01-10 DIAGNOSIS — M4150 Other secondary scoliosis, site unspecified: Secondary | ICD-10-CM | POA: Diagnosis not present

## 2019-01-10 DIAGNOSIS — M4316 Spondylolisthesis, lumbar region: Secondary | ICD-10-CM | POA: Diagnosis not present

## 2019-01-10 DIAGNOSIS — M4726 Other spondylosis with radiculopathy, lumbar region: Secondary | ICD-10-CM | POA: Diagnosis not present

## 2019-01-10 DIAGNOSIS — Z981 Arthrodesis status: Secondary | ICD-10-CM | POA: Diagnosis not present

## 2019-01-10 DIAGNOSIS — Z9884 Bariatric surgery status: Secondary | ICD-10-CM | POA: Diagnosis not present

## 2019-01-10 DIAGNOSIS — M5135 Other intervertebral disc degeneration, thoracolumbar region: Secondary | ICD-10-CM | POA: Diagnosis not present

## 2019-01-10 DIAGNOSIS — G4733 Obstructive sleep apnea (adult) (pediatric): Secondary | ICD-10-CM | POA: Diagnosis not present

## 2019-01-10 DIAGNOSIS — M48062 Spinal stenosis, lumbar region with neurogenic claudication: Secondary | ICD-10-CM | POA: Diagnosis not present

## 2019-01-10 DIAGNOSIS — M4015 Other secondary kyphosis, thoracolumbar region: Secondary | ICD-10-CM | POA: Diagnosis not present

## 2019-01-10 DIAGNOSIS — M47816 Spondylosis without myelopathy or radiculopathy, lumbar region: Secondary | ICD-10-CM | POA: Diagnosis not present

## 2019-01-10 DIAGNOSIS — M5106 Intervertebral disc disorders with myelopathy, lumbar region: Secondary | ICD-10-CM | POA: Diagnosis not present

## 2019-01-10 DIAGNOSIS — Z6841 Body Mass Index (BMI) 40.0 and over, adult: Secondary | ICD-10-CM | POA: Diagnosis not present

## 2019-01-10 DIAGNOSIS — M48061 Spinal stenosis, lumbar region without neurogenic claudication: Secondary | ICD-10-CM | POA: Diagnosis not present

## 2019-01-10 DIAGNOSIS — M5116 Intervertebral disc disorders with radiculopathy, lumbar region: Secondary | ICD-10-CM | POA: Diagnosis not present

## 2019-01-10 DIAGNOSIS — M4310 Spondylolisthesis, site unspecified: Secondary | ICD-10-CM | POA: Diagnosis not present

## 2019-01-11 DIAGNOSIS — Z981 Arthrodesis status: Secondary | ICD-10-CM | POA: Diagnosis not present

## 2019-01-15 DIAGNOSIS — F1120 Opioid dependence, uncomplicated: Secondary | ICD-10-CM | POA: Diagnosis not present

## 2019-01-15 DIAGNOSIS — G894 Chronic pain syndrome: Secondary | ICD-10-CM | POA: Diagnosis not present

## 2019-01-19 DIAGNOSIS — G894 Chronic pain syndrome: Secondary | ICD-10-CM | POA: Diagnosis not present

## 2019-01-19 DIAGNOSIS — F1120 Opioid dependence, uncomplicated: Secondary | ICD-10-CM | POA: Diagnosis not present

## 2019-01-22 DIAGNOSIS — F1120 Opioid dependence, uncomplicated: Secondary | ICD-10-CM | POA: Diagnosis not present

## 2019-01-22 DIAGNOSIS — G894 Chronic pain syndrome: Secondary | ICD-10-CM | POA: Diagnosis not present

## 2019-01-25 DIAGNOSIS — Z1159 Encounter for screening for other viral diseases: Secondary | ICD-10-CM | POA: Diagnosis not present

## 2019-01-25 DIAGNOSIS — Z20828 Contact with and (suspected) exposure to other viral communicable diseases: Secondary | ICD-10-CM | POA: Diagnosis not present

## 2019-01-25 DIAGNOSIS — M5136 Other intervertebral disc degeneration, lumbar region: Secondary | ICD-10-CM | POA: Diagnosis not present

## 2019-01-25 DIAGNOSIS — Z01812 Encounter for preprocedural laboratory examination: Secondary | ICD-10-CM | POA: Diagnosis not present

## 2019-01-26 DIAGNOSIS — G894 Chronic pain syndrome: Secondary | ICD-10-CM | POA: Diagnosis not present

## 2019-01-26 DIAGNOSIS — F1120 Opioid dependence, uncomplicated: Secondary | ICD-10-CM | POA: Diagnosis not present

## 2019-01-28 DIAGNOSIS — E291 Testicular hypofunction: Secondary | ICD-10-CM | POA: Diagnosis not present

## 2019-01-29 DIAGNOSIS — F1120 Opioid dependence, uncomplicated: Secondary | ICD-10-CM | POA: Diagnosis not present

## 2019-01-29 DIAGNOSIS — G894 Chronic pain syndrome: Secondary | ICD-10-CM | POA: Diagnosis not present

## 2019-01-31 DIAGNOSIS — M48061 Spinal stenosis, lumbar region without neurogenic claudication: Secondary | ICD-10-CM | POA: Diagnosis not present

## 2019-01-31 DIAGNOSIS — M4716 Other spondylosis with myelopathy, lumbar region: Secondary | ICD-10-CM | POA: Diagnosis not present

## 2019-01-31 DIAGNOSIS — M5116 Intervertebral disc disorders with radiculopathy, lumbar region: Secondary | ICD-10-CM | POA: Diagnosis not present

## 2019-01-31 DIAGNOSIS — M4184 Other forms of scoliosis, thoracic region: Secondary | ICD-10-CM | POA: Diagnosis not present

## 2019-01-31 DIAGNOSIS — M4185 Other forms of scoliosis, thoracolumbar region: Secondary | ICD-10-CM | POA: Diagnosis not present

## 2019-01-31 DIAGNOSIS — M4150 Other secondary scoliosis, site unspecified: Secondary | ICD-10-CM | POA: Diagnosis not present

## 2019-01-31 DIAGNOSIS — M4010 Other secondary kyphosis, site unspecified: Secondary | ICD-10-CM | POA: Diagnosis not present

## 2019-01-31 DIAGNOSIS — M5416 Radiculopathy, lumbar region: Secondary | ICD-10-CM | POA: Diagnosis not present

## 2019-01-31 DIAGNOSIS — M47896 Other spondylosis, lumbar region: Secondary | ICD-10-CM | POA: Diagnosis not present

## 2019-01-31 DIAGNOSIS — M48062 Spinal stenosis, lumbar region with neurogenic claudication: Secondary | ICD-10-CM | POA: Diagnosis not present

## 2019-01-31 DIAGNOSIS — Z981 Arthrodesis status: Secondary | ICD-10-CM | POA: Diagnosis not present

## 2019-01-31 DIAGNOSIS — M4316 Spondylolisthesis, lumbar region: Secondary | ICD-10-CM | POA: Diagnosis not present

## 2019-02-05 DIAGNOSIS — F1120 Opioid dependence, uncomplicated: Secondary | ICD-10-CM | POA: Diagnosis not present

## 2019-02-05 DIAGNOSIS — G894 Chronic pain syndrome: Secondary | ICD-10-CM | POA: Diagnosis not present

## 2019-02-09 DIAGNOSIS — G894 Chronic pain syndrome: Secondary | ICD-10-CM | POA: Diagnosis not present

## 2019-02-09 DIAGNOSIS — F1120 Opioid dependence, uncomplicated: Secondary | ICD-10-CM | POA: Diagnosis not present

## 2019-02-09 DIAGNOSIS — M545 Low back pain: Secondary | ICD-10-CM | POA: Diagnosis not present

## 2019-02-10 DIAGNOSIS — M545 Low back pain: Secondary | ICD-10-CM | POA: Diagnosis not present

## 2019-02-12 DIAGNOSIS — F1120 Opioid dependence, uncomplicated: Secondary | ICD-10-CM | POA: Diagnosis not present

## 2019-02-12 DIAGNOSIS — G894 Chronic pain syndrome: Secondary | ICD-10-CM | POA: Diagnosis not present

## 2019-02-14 DIAGNOSIS — E785 Hyperlipidemia, unspecified: Secondary | ICD-10-CM | POA: Diagnosis not present

## 2019-02-14 DIAGNOSIS — Z79899 Other long term (current) drug therapy: Secondary | ICD-10-CM | POA: Diagnosis not present

## 2019-02-14 DIAGNOSIS — Z981 Arthrodesis status: Secondary | ICD-10-CM | POA: Diagnosis not present

## 2019-02-14 DIAGNOSIS — M4010 Other secondary kyphosis, site unspecified: Secondary | ICD-10-CM | POA: Diagnosis not present

## 2019-02-14 DIAGNOSIS — T8130XA Disruption of wound, unspecified, initial encounter: Secondary | ICD-10-CM | POA: Diagnosis not present

## 2019-02-14 DIAGNOSIS — T8140XA Infection following a procedure, unspecified, initial encounter: Secondary | ICD-10-CM | POA: Diagnosis not present

## 2019-02-14 DIAGNOSIS — M1990 Unspecified osteoarthritis, unspecified site: Secondary | ICD-10-CM | POA: Diagnosis not present

## 2019-02-14 DIAGNOSIS — K219 Gastro-esophageal reflux disease without esophagitis: Secondary | ICD-10-CM | POA: Diagnosis not present

## 2019-02-14 DIAGNOSIS — F1110 Opioid abuse, uncomplicated: Secondary | ICD-10-CM | POA: Diagnosis not present

## 2019-02-14 DIAGNOSIS — Z20828 Contact with and (suspected) exposure to other viral communicable diseases: Secondary | ICD-10-CM | POA: Diagnosis not present

## 2019-02-14 DIAGNOSIS — G4730 Sleep apnea, unspecified: Secondary | ICD-10-CM | POA: Diagnosis not present

## 2019-02-14 DIAGNOSIS — T8132XA Disruption of internal operation (surgical) wound, not elsewhere classified, initial encounter: Secondary | ICD-10-CM | POA: Diagnosis not present

## 2019-02-14 DIAGNOSIS — I10 Essential (primary) hypertension: Secondary | ICD-10-CM | POA: Diagnosis not present

## 2019-02-19 DIAGNOSIS — G894 Chronic pain syndrome: Secondary | ICD-10-CM | POA: Diagnosis not present

## 2019-02-19 DIAGNOSIS — M545 Low back pain: Secondary | ICD-10-CM | POA: Diagnosis not present

## 2019-02-19 DIAGNOSIS — F1120 Opioid dependence, uncomplicated: Secondary | ICD-10-CM | POA: Diagnosis not present

## 2019-02-23 DIAGNOSIS — G894 Chronic pain syndrome: Secondary | ICD-10-CM | POA: Diagnosis not present

## 2019-02-23 DIAGNOSIS — M545 Low back pain: Secondary | ICD-10-CM | POA: Diagnosis not present

## 2019-02-23 DIAGNOSIS — F1120 Opioid dependence, uncomplicated: Secondary | ICD-10-CM | POA: Diagnosis not present

## 2019-02-24 DIAGNOSIS — M4010 Other secondary kyphosis, site unspecified: Secondary | ICD-10-CM | POA: Diagnosis not present

## 2019-02-24 DIAGNOSIS — Z981 Arthrodesis status: Secondary | ICD-10-CM | POA: Diagnosis not present

## 2019-02-26 DIAGNOSIS — F1120 Opioid dependence, uncomplicated: Secondary | ICD-10-CM | POA: Diagnosis not present

## 2019-03-02 DIAGNOSIS — G894 Chronic pain syndrome: Secondary | ICD-10-CM | POA: Diagnosis not present

## 2019-03-02 DIAGNOSIS — F1120 Opioid dependence, uncomplicated: Secondary | ICD-10-CM | POA: Diagnosis not present

## 2019-03-02 DIAGNOSIS — M545 Low back pain: Secondary | ICD-10-CM | POA: Diagnosis not present

## 2019-03-05 DIAGNOSIS — F1120 Opioid dependence, uncomplicated: Secondary | ICD-10-CM | POA: Diagnosis not present

## 2019-03-05 DIAGNOSIS — G894 Chronic pain syndrome: Secondary | ICD-10-CM | POA: Diagnosis not present

## 2019-03-09 DIAGNOSIS — F1120 Opioid dependence, uncomplicated: Secondary | ICD-10-CM | POA: Diagnosis not present

## 2019-03-09 DIAGNOSIS — G894 Chronic pain syndrome: Secondary | ICD-10-CM | POA: Diagnosis not present

## 2019-03-10 DIAGNOSIS — E291 Testicular hypofunction: Secondary | ICD-10-CM | POA: Diagnosis not present

## 2019-03-12 DIAGNOSIS — M545 Low back pain: Secondary | ICD-10-CM | POA: Diagnosis not present

## 2019-03-12 DIAGNOSIS — F1120 Opioid dependence, uncomplicated: Secondary | ICD-10-CM | POA: Diagnosis not present

## 2019-03-12 DIAGNOSIS — G894 Chronic pain syndrome: Secondary | ICD-10-CM | POA: Diagnosis not present

## 2019-03-16 DIAGNOSIS — M545 Low back pain: Secondary | ICD-10-CM | POA: Diagnosis not present

## 2019-03-16 DIAGNOSIS — G894 Chronic pain syndrome: Secondary | ICD-10-CM | POA: Diagnosis not present

## 2019-03-16 DIAGNOSIS — F1120 Opioid dependence, uncomplicated: Secondary | ICD-10-CM | POA: Diagnosis not present

## 2019-03-19 DIAGNOSIS — F1120 Opioid dependence, uncomplicated: Secondary | ICD-10-CM | POA: Diagnosis not present

## 2019-03-19 DIAGNOSIS — G894 Chronic pain syndrome: Secondary | ICD-10-CM | POA: Diagnosis not present

## 2019-03-19 DIAGNOSIS — M545 Low back pain: Secondary | ICD-10-CM | POA: Diagnosis not present

## 2019-03-23 DIAGNOSIS — F1120 Opioid dependence, uncomplicated: Secondary | ICD-10-CM | POA: Diagnosis not present

## 2019-03-23 DIAGNOSIS — G894 Chronic pain syndrome: Secondary | ICD-10-CM | POA: Diagnosis not present

## 2019-03-23 DIAGNOSIS — M545 Low back pain: Secondary | ICD-10-CM | POA: Diagnosis not present

## 2019-03-24 DIAGNOSIS — R7301 Impaired fasting glucose: Secondary | ICD-10-CM | POA: Diagnosis not present

## 2019-03-24 DIAGNOSIS — R601 Generalized edema: Secondary | ICD-10-CM | POA: Diagnosis not present

## 2019-03-24 DIAGNOSIS — F411 Generalized anxiety disorder: Secondary | ICD-10-CM | POA: Diagnosis not present

## 2019-03-24 DIAGNOSIS — R600 Localized edema: Secondary | ICD-10-CM | POA: Diagnosis not present

## 2019-03-24 DIAGNOSIS — D509 Iron deficiency anemia, unspecified: Secondary | ICD-10-CM | POA: Diagnosis not present

## 2019-03-24 DIAGNOSIS — E785 Hyperlipidemia, unspecified: Secondary | ICD-10-CM | POA: Diagnosis not present

## 2019-03-24 DIAGNOSIS — G894 Chronic pain syndrome: Secondary | ICD-10-CM | POA: Diagnosis not present

## 2019-03-24 DIAGNOSIS — E291 Testicular hypofunction: Secondary | ICD-10-CM | POA: Diagnosis not present

## 2019-03-24 DIAGNOSIS — N529 Male erectile dysfunction, unspecified: Secondary | ICD-10-CM | POA: Diagnosis not present

## 2019-03-24 DIAGNOSIS — M25561 Pain in right knee: Secondary | ICD-10-CM | POA: Diagnosis not present

## 2019-03-24 DIAGNOSIS — D638 Anemia in other chronic diseases classified elsewhere: Secondary | ICD-10-CM | POA: Diagnosis not present

## 2019-03-26 DIAGNOSIS — G894 Chronic pain syndrome: Secondary | ICD-10-CM | POA: Diagnosis not present

## 2019-03-26 DIAGNOSIS — F1120 Opioid dependence, uncomplicated: Secondary | ICD-10-CM | POA: Diagnosis not present

## 2019-03-28 DIAGNOSIS — G4730 Sleep apnea, unspecified: Secondary | ICD-10-CM | POA: Diagnosis not present

## 2019-03-28 DIAGNOSIS — R7301 Impaired fasting glucose: Secondary | ICD-10-CM | POA: Diagnosis not present

## 2019-03-28 DIAGNOSIS — Z23 Encounter for immunization: Secondary | ICD-10-CM | POA: Diagnosis not present

## 2019-03-28 DIAGNOSIS — G894 Chronic pain syndrome: Secondary | ICD-10-CM | POA: Diagnosis not present

## 2019-03-28 DIAGNOSIS — F419 Anxiety disorder, unspecified: Secondary | ICD-10-CM | POA: Diagnosis not present

## 2019-03-28 DIAGNOSIS — Z96659 Presence of unspecified artificial knee joint: Secondary | ICD-10-CM | POA: Diagnosis not present

## 2019-03-28 DIAGNOSIS — E291 Testicular hypofunction: Secondary | ICD-10-CM | POA: Diagnosis not present

## 2019-03-28 DIAGNOSIS — M25551 Pain in right hip: Secondary | ICD-10-CM | POA: Diagnosis not present

## 2019-03-28 DIAGNOSIS — F39 Unspecified mood [affective] disorder: Secondary | ICD-10-CM | POA: Diagnosis not present

## 2019-03-30 DIAGNOSIS — G894 Chronic pain syndrome: Secondary | ICD-10-CM | POA: Diagnosis not present

## 2019-03-30 DIAGNOSIS — F1120 Opioid dependence, uncomplicated: Secondary | ICD-10-CM | POA: Diagnosis not present

## 2019-03-30 DIAGNOSIS — M545 Low back pain: Secondary | ICD-10-CM | POA: Diagnosis not present

## 2019-04-02 DIAGNOSIS — F1120 Opioid dependence, uncomplicated: Secondary | ICD-10-CM | POA: Diagnosis not present

## 2019-04-02 DIAGNOSIS — G894 Chronic pain syndrome: Secondary | ICD-10-CM | POA: Diagnosis not present

## 2019-04-06 DIAGNOSIS — G894 Chronic pain syndrome: Secondary | ICD-10-CM | POA: Diagnosis not present

## 2019-04-06 DIAGNOSIS — F1120 Opioid dependence, uncomplicated: Secondary | ICD-10-CM | POA: Diagnosis not present

## 2019-04-13 DIAGNOSIS — F1120 Opioid dependence, uncomplicated: Secondary | ICD-10-CM | POA: Diagnosis not present

## 2019-04-13 DIAGNOSIS — G894 Chronic pain syndrome: Secondary | ICD-10-CM | POA: Diagnosis not present

## 2019-04-13 DIAGNOSIS — M545 Low back pain: Secondary | ICD-10-CM | POA: Diagnosis not present

## 2019-04-16 DIAGNOSIS — G894 Chronic pain syndrome: Secondary | ICD-10-CM | POA: Diagnosis not present

## 2019-04-16 DIAGNOSIS — F1120 Opioid dependence, uncomplicated: Secondary | ICD-10-CM | POA: Diagnosis not present

## 2019-04-20 DIAGNOSIS — G894 Chronic pain syndrome: Secondary | ICD-10-CM | POA: Diagnosis not present

## 2019-04-20 DIAGNOSIS — F1120 Opioid dependence, uncomplicated: Secondary | ICD-10-CM | POA: Diagnosis not present

## 2019-04-20 DIAGNOSIS — M545 Low back pain: Secondary | ICD-10-CM | POA: Diagnosis not present

## 2019-04-20 DIAGNOSIS — E291 Testicular hypofunction: Secondary | ICD-10-CM | POA: Diagnosis not present

## 2019-04-23 DIAGNOSIS — M545 Low back pain: Secondary | ICD-10-CM | POA: Diagnosis not present

## 2019-04-23 DIAGNOSIS — F1120 Opioid dependence, uncomplicated: Secondary | ICD-10-CM | POA: Diagnosis not present

## 2019-04-27 DIAGNOSIS — G894 Chronic pain syndrome: Secondary | ICD-10-CM | POA: Diagnosis not present

## 2019-04-27 DIAGNOSIS — F1120 Opioid dependence, uncomplicated: Secondary | ICD-10-CM | POA: Diagnosis not present

## 2019-04-30 DIAGNOSIS — F1120 Opioid dependence, uncomplicated: Secondary | ICD-10-CM | POA: Diagnosis not present

## 2019-04-30 DIAGNOSIS — M545 Low back pain: Secondary | ICD-10-CM | POA: Diagnosis not present

## 2019-05-04 DIAGNOSIS — G894 Chronic pain syndrome: Secondary | ICD-10-CM | POA: Diagnosis not present

## 2019-05-04 DIAGNOSIS — F1120 Opioid dependence, uncomplicated: Secondary | ICD-10-CM | POA: Diagnosis not present

## 2019-05-07 DIAGNOSIS — G894 Chronic pain syndrome: Secondary | ICD-10-CM | POA: Diagnosis not present

## 2019-05-07 DIAGNOSIS — M545 Low back pain: Secondary | ICD-10-CM | POA: Diagnosis not present

## 2019-05-07 DIAGNOSIS — F1120 Opioid dependence, uncomplicated: Secondary | ICD-10-CM | POA: Diagnosis not present

## 2019-05-11 DIAGNOSIS — F1120 Opioid dependence, uncomplicated: Secondary | ICD-10-CM | POA: Diagnosis not present

## 2019-05-11 DIAGNOSIS — G894 Chronic pain syndrome: Secondary | ICD-10-CM | POA: Diagnosis not present

## 2019-05-14 DIAGNOSIS — G894 Chronic pain syndrome: Secondary | ICD-10-CM | POA: Diagnosis not present

## 2019-05-14 DIAGNOSIS — F1120 Opioid dependence, uncomplicated: Secondary | ICD-10-CM | POA: Diagnosis not present

## 2019-05-17 DIAGNOSIS — M7918 Myalgia, other site: Secondary | ICD-10-CM | POA: Diagnosis not present

## 2019-05-17 DIAGNOSIS — E291 Testicular hypofunction: Secondary | ICD-10-CM | POA: Diagnosis not present

## 2019-05-17 DIAGNOSIS — M4010 Other secondary kyphosis, site unspecified: Secondary | ICD-10-CM | POA: Diagnosis not present

## 2019-05-17 DIAGNOSIS — F1120 Opioid dependence, uncomplicated: Secondary | ICD-10-CM | POA: Diagnosis not present

## 2019-05-17 DIAGNOSIS — M4325 Fusion of spine, thoracolumbar region: Secondary | ICD-10-CM | POA: Diagnosis not present

## 2019-05-17 DIAGNOSIS — G894 Chronic pain syndrome: Secondary | ICD-10-CM | POA: Diagnosis not present

## 2019-05-21 DIAGNOSIS — G894 Chronic pain syndrome: Secondary | ICD-10-CM | POA: Diagnosis not present

## 2019-05-21 DIAGNOSIS — F1120 Opioid dependence, uncomplicated: Secondary | ICD-10-CM | POA: Diagnosis not present

## 2019-05-23 DIAGNOSIS — E291 Testicular hypofunction: Secondary | ICD-10-CM | POA: Diagnosis not present

## 2019-05-25 DIAGNOSIS — G894 Chronic pain syndrome: Secondary | ICD-10-CM | POA: Diagnosis not present

## 2019-05-25 DIAGNOSIS — F1120 Opioid dependence, uncomplicated: Secondary | ICD-10-CM | POA: Diagnosis not present

## 2019-05-28 DIAGNOSIS — F1120 Opioid dependence, uncomplicated: Secondary | ICD-10-CM | POA: Diagnosis not present

## 2019-05-28 DIAGNOSIS — G894 Chronic pain syndrome: Secondary | ICD-10-CM | POA: Diagnosis not present

## 2019-06-01 DIAGNOSIS — G894 Chronic pain syndrome: Secondary | ICD-10-CM | POA: Diagnosis not present

## 2019-06-01 DIAGNOSIS — F1120 Opioid dependence, uncomplicated: Secondary | ICD-10-CM | POA: Diagnosis not present

## 2019-06-02 DIAGNOSIS — F1120 Opioid dependence, uncomplicated: Secondary | ICD-10-CM | POA: Diagnosis not present

## 2019-06-04 DIAGNOSIS — F1120 Opioid dependence, uncomplicated: Secondary | ICD-10-CM | POA: Diagnosis not present

## 2019-06-04 DIAGNOSIS — G894 Chronic pain syndrome: Secondary | ICD-10-CM | POA: Diagnosis not present

## 2019-06-08 DIAGNOSIS — G894 Chronic pain syndrome: Secondary | ICD-10-CM | POA: Diagnosis not present

## 2019-06-08 DIAGNOSIS — F1120 Opioid dependence, uncomplicated: Secondary | ICD-10-CM | POA: Diagnosis not present

## 2019-06-11 DIAGNOSIS — F1120 Opioid dependence, uncomplicated: Secondary | ICD-10-CM | POA: Diagnosis not present

## 2019-06-11 DIAGNOSIS — G894 Chronic pain syndrome: Secondary | ICD-10-CM | POA: Diagnosis not present

## 2019-06-14 DIAGNOSIS — G894 Chronic pain syndrome: Secondary | ICD-10-CM | POA: Diagnosis not present

## 2019-06-14 DIAGNOSIS — F1120 Opioid dependence, uncomplicated: Secondary | ICD-10-CM | POA: Diagnosis not present

## 2019-06-15 DIAGNOSIS — F1120 Opioid dependence, uncomplicated: Secondary | ICD-10-CM | POA: Diagnosis not present

## 2019-06-18 DIAGNOSIS — F1120 Opioid dependence, uncomplicated: Secondary | ICD-10-CM | POA: Diagnosis not present

## 2019-06-21 DIAGNOSIS — F1120 Opioid dependence, uncomplicated: Secondary | ICD-10-CM | POA: Diagnosis not present

## 2019-06-21 DIAGNOSIS — G894 Chronic pain syndrome: Secondary | ICD-10-CM | POA: Diagnosis not present

## 2019-06-22 DIAGNOSIS — F1120 Opioid dependence, uncomplicated: Secondary | ICD-10-CM | POA: Diagnosis not present

## 2019-06-25 DIAGNOSIS — G894 Chronic pain syndrome: Secondary | ICD-10-CM | POA: Diagnosis not present

## 2019-06-25 DIAGNOSIS — F1120 Opioid dependence, uncomplicated: Secondary | ICD-10-CM | POA: Diagnosis not present

## 2019-06-25 DIAGNOSIS — Z7151 Drug abuse counseling and surveillance of drug abuser: Secondary | ICD-10-CM | POA: Diagnosis not present

## 2019-06-29 DIAGNOSIS — F1120 Opioid dependence, uncomplicated: Secondary | ICD-10-CM | POA: Diagnosis not present

## 2019-07-02 DIAGNOSIS — G894 Chronic pain syndrome: Secondary | ICD-10-CM | POA: Diagnosis not present

## 2019-07-02 DIAGNOSIS — F1120 Opioid dependence, uncomplicated: Secondary | ICD-10-CM | POA: Diagnosis not present

## 2019-07-06 DIAGNOSIS — G894 Chronic pain syndrome: Secondary | ICD-10-CM | POA: Diagnosis not present

## 2019-07-06 DIAGNOSIS — F1120 Opioid dependence, uncomplicated: Secondary | ICD-10-CM | POA: Diagnosis not present

## 2019-07-09 DIAGNOSIS — F1120 Opioid dependence, uncomplicated: Secondary | ICD-10-CM | POA: Diagnosis not present

## 2019-07-09 DIAGNOSIS — G894 Chronic pain syndrome: Secondary | ICD-10-CM | POA: Diagnosis not present

## 2019-07-13 DIAGNOSIS — M545 Low back pain: Secondary | ICD-10-CM | POA: Diagnosis not present

## 2019-07-13 DIAGNOSIS — G894 Chronic pain syndrome: Secondary | ICD-10-CM | POA: Diagnosis not present

## 2019-07-13 DIAGNOSIS — F1120 Opioid dependence, uncomplicated: Secondary | ICD-10-CM | POA: Diagnosis not present

## 2019-07-16 DIAGNOSIS — Z7151 Drug abuse counseling and surveillance of drug abuser: Secondary | ICD-10-CM | POA: Diagnosis not present

## 2019-07-16 DIAGNOSIS — G894 Chronic pain syndrome: Secondary | ICD-10-CM | POA: Diagnosis not present

## 2019-07-16 DIAGNOSIS — F1120 Opioid dependence, uncomplicated: Secondary | ICD-10-CM | POA: Diagnosis not present

## 2019-07-20 DIAGNOSIS — G894 Chronic pain syndrome: Secondary | ICD-10-CM | POA: Diagnosis not present

## 2019-07-20 DIAGNOSIS — F1120 Opioid dependence, uncomplicated: Secondary | ICD-10-CM | POA: Diagnosis not present

## 2019-07-20 DIAGNOSIS — E291 Testicular hypofunction: Secondary | ICD-10-CM | POA: Diagnosis not present

## 2019-07-23 DIAGNOSIS — G894 Chronic pain syndrome: Secondary | ICD-10-CM | POA: Diagnosis not present

## 2019-07-23 DIAGNOSIS — F1120 Opioid dependence, uncomplicated: Secondary | ICD-10-CM | POA: Diagnosis not present

## 2019-07-23 DIAGNOSIS — Z7151 Drug abuse counseling and surveillance of drug abuser: Secondary | ICD-10-CM | POA: Diagnosis not present

## 2019-07-26 DIAGNOSIS — G894 Chronic pain syndrome: Secondary | ICD-10-CM | POA: Diagnosis not present

## 2019-07-26 DIAGNOSIS — F1120 Opioid dependence, uncomplicated: Secondary | ICD-10-CM | POA: Diagnosis not present

## 2019-07-27 DIAGNOSIS — F1120 Opioid dependence, uncomplicated: Secondary | ICD-10-CM | POA: Diagnosis not present

## 2019-07-30 DIAGNOSIS — G894 Chronic pain syndrome: Secondary | ICD-10-CM | POA: Diagnosis not present

## 2019-07-30 DIAGNOSIS — Z7151 Drug abuse counseling and surveillance of drug abuser: Secondary | ICD-10-CM | POA: Diagnosis not present

## 2019-07-30 DIAGNOSIS — F1120 Opioid dependence, uncomplicated: Secondary | ICD-10-CM | POA: Diagnosis not present

## 2019-08-02 DIAGNOSIS — G894 Chronic pain syndrome: Secondary | ICD-10-CM | POA: Diagnosis not present

## 2019-08-02 DIAGNOSIS — F1120 Opioid dependence, uncomplicated: Secondary | ICD-10-CM | POA: Diagnosis not present

## 2019-08-03 DIAGNOSIS — E291 Testicular hypofunction: Secondary | ICD-10-CM | POA: Diagnosis not present

## 2019-08-03 DIAGNOSIS — F1120 Opioid dependence, uncomplicated: Secondary | ICD-10-CM | POA: Diagnosis not present

## 2019-08-06 DIAGNOSIS — G894 Chronic pain syndrome: Secondary | ICD-10-CM | POA: Diagnosis not present

## 2019-08-06 DIAGNOSIS — F1120 Opioid dependence, uncomplicated: Secondary | ICD-10-CM | POA: Diagnosis not present

## 2019-08-08 DIAGNOSIS — R7301 Impaired fasting glucose: Secondary | ICD-10-CM | POA: Diagnosis not present

## 2019-08-08 DIAGNOSIS — F411 Generalized anxiety disorder: Secondary | ICD-10-CM | POA: Diagnosis not present

## 2019-08-08 DIAGNOSIS — R601 Generalized edema: Secondary | ICD-10-CM | POA: Diagnosis not present

## 2019-08-08 DIAGNOSIS — D638 Anemia in other chronic diseases classified elsewhere: Secondary | ICD-10-CM | POA: Diagnosis not present

## 2019-08-08 DIAGNOSIS — G894 Chronic pain syndrome: Secondary | ICD-10-CM | POA: Diagnosis not present

## 2019-08-08 DIAGNOSIS — N529 Male erectile dysfunction, unspecified: Secondary | ICD-10-CM | POA: Diagnosis not present

## 2019-08-08 DIAGNOSIS — R600 Localized edema: Secondary | ICD-10-CM | POA: Diagnosis not present

## 2019-08-08 DIAGNOSIS — D509 Iron deficiency anemia, unspecified: Secondary | ICD-10-CM | POA: Diagnosis not present

## 2019-08-08 DIAGNOSIS — E291 Testicular hypofunction: Secondary | ICD-10-CM | POA: Diagnosis not present

## 2019-08-09 DIAGNOSIS — F1120 Opioid dependence, uncomplicated: Secondary | ICD-10-CM | POA: Diagnosis not present

## 2019-08-09 DIAGNOSIS — G894 Chronic pain syndrome: Secondary | ICD-10-CM | POA: Diagnosis not present

## 2019-08-10 DIAGNOSIS — F1120 Opioid dependence, uncomplicated: Secondary | ICD-10-CM | POA: Diagnosis not present

## 2019-08-13 DIAGNOSIS — F1120 Opioid dependence, uncomplicated: Secondary | ICD-10-CM | POA: Diagnosis not present

## 2019-08-13 DIAGNOSIS — G894 Chronic pain syndrome: Secondary | ICD-10-CM | POA: Diagnosis not present

## 2019-08-16 DIAGNOSIS — F1120 Opioid dependence, uncomplicated: Secondary | ICD-10-CM | POA: Diagnosis not present

## 2019-08-16 DIAGNOSIS — G894 Chronic pain syndrome: Secondary | ICD-10-CM | POA: Diagnosis not present

## 2019-08-17 DIAGNOSIS — M7918 Myalgia, other site: Secondary | ICD-10-CM | POA: Diagnosis not present

## 2019-08-17 DIAGNOSIS — F1120 Opioid dependence, uncomplicated: Secondary | ICD-10-CM | POA: Diagnosis not present

## 2019-08-17 DIAGNOSIS — M4010 Other secondary kyphosis, site unspecified: Secondary | ICD-10-CM | POA: Diagnosis not present

## 2019-08-17 DIAGNOSIS — Z6839 Body mass index (BMI) 39.0-39.9, adult: Secondary | ICD-10-CM | POA: Diagnosis not present

## 2019-08-17 DIAGNOSIS — M4325 Fusion of spine, thoracolumbar region: Secondary | ICD-10-CM | POA: Diagnosis not present

## 2019-08-20 DIAGNOSIS — G894 Chronic pain syndrome: Secondary | ICD-10-CM | POA: Diagnosis not present

## 2019-08-20 DIAGNOSIS — F1120 Opioid dependence, uncomplicated: Secondary | ICD-10-CM | POA: Diagnosis not present

## 2019-08-23 DIAGNOSIS — F1120 Opioid dependence, uncomplicated: Secondary | ICD-10-CM | POA: Diagnosis not present

## 2019-08-23 DIAGNOSIS — G894 Chronic pain syndrome: Secondary | ICD-10-CM | POA: Diagnosis not present

## 2019-08-24 DIAGNOSIS — E291 Testicular hypofunction: Secondary | ICD-10-CM | POA: Diagnosis not present

## 2019-08-24 DIAGNOSIS — F1120 Opioid dependence, uncomplicated: Secondary | ICD-10-CM | POA: Diagnosis not present

## 2019-08-27 DIAGNOSIS — G894 Chronic pain syndrome: Secondary | ICD-10-CM | POA: Diagnosis not present

## 2019-08-27 DIAGNOSIS — F1120 Opioid dependence, uncomplicated: Secondary | ICD-10-CM | POA: Diagnosis not present

## 2019-08-30 DIAGNOSIS — G894 Chronic pain syndrome: Secondary | ICD-10-CM | POA: Diagnosis not present

## 2019-08-30 DIAGNOSIS — F1120 Opioid dependence, uncomplicated: Secondary | ICD-10-CM | POA: Diagnosis not present

## 2019-08-31 DIAGNOSIS — F1120 Opioid dependence, uncomplicated: Secondary | ICD-10-CM | POA: Diagnosis not present

## 2019-09-06 DIAGNOSIS — Z0100 Encounter for examination of eyes and vision without abnormal findings: Secondary | ICD-10-CM | POA: Diagnosis not present

## 2019-09-07 DIAGNOSIS — F1120 Opioid dependence, uncomplicated: Secondary | ICD-10-CM | POA: Diagnosis not present

## 2019-09-07 DIAGNOSIS — E291 Testicular hypofunction: Secondary | ICD-10-CM | POA: Diagnosis not present

## 2019-09-07 DIAGNOSIS — G894 Chronic pain syndrome: Secondary | ICD-10-CM | POA: Diagnosis not present

## 2019-09-13 DIAGNOSIS — G894 Chronic pain syndrome: Secondary | ICD-10-CM | POA: Diagnosis not present

## 2019-09-13 DIAGNOSIS — F1120 Opioid dependence, uncomplicated: Secondary | ICD-10-CM | POA: Diagnosis not present

## 2019-09-17 DIAGNOSIS — F1120 Opioid dependence, uncomplicated: Secondary | ICD-10-CM | POA: Diagnosis not present

## 2019-09-17 DIAGNOSIS — G894 Chronic pain syndrome: Secondary | ICD-10-CM | POA: Diagnosis not present

## 2019-09-20 DIAGNOSIS — G894 Chronic pain syndrome: Secondary | ICD-10-CM | POA: Diagnosis not present

## 2019-09-20 DIAGNOSIS — F1120 Opioid dependence, uncomplicated: Secondary | ICD-10-CM | POA: Diagnosis not present

## 2019-09-21 DIAGNOSIS — F1120 Opioid dependence, uncomplicated: Secondary | ICD-10-CM | POA: Diagnosis not present

## 2019-09-24 DIAGNOSIS — F1120 Opioid dependence, uncomplicated: Secondary | ICD-10-CM | POA: Diagnosis not present

## 2019-09-24 DIAGNOSIS — G894 Chronic pain syndrome: Secondary | ICD-10-CM | POA: Diagnosis not present

## 2019-09-27 DIAGNOSIS — G894 Chronic pain syndrome: Secondary | ICD-10-CM | POA: Diagnosis not present

## 2019-09-27 DIAGNOSIS — F1120 Opioid dependence, uncomplicated: Secondary | ICD-10-CM | POA: Diagnosis not present

## 2019-10-01 DIAGNOSIS — F1120 Opioid dependence, uncomplicated: Secondary | ICD-10-CM | POA: Diagnosis not present

## 2019-10-01 DIAGNOSIS — G894 Chronic pain syndrome: Secondary | ICD-10-CM | POA: Diagnosis not present

## 2019-10-04 DIAGNOSIS — G894 Chronic pain syndrome: Secondary | ICD-10-CM | POA: Diagnosis not present

## 2019-10-04 DIAGNOSIS — F1120 Opioid dependence, uncomplicated: Secondary | ICD-10-CM | POA: Diagnosis not present

## 2019-10-05 DIAGNOSIS — F1120 Opioid dependence, uncomplicated: Secondary | ICD-10-CM | POA: Diagnosis not present

## 2019-10-05 DIAGNOSIS — E291 Testicular hypofunction: Secondary | ICD-10-CM | POA: Diagnosis not present

## 2019-10-08 DIAGNOSIS — Z7151 Drug abuse counseling and surveillance of drug abuser: Secondary | ICD-10-CM | POA: Diagnosis not present

## 2019-10-08 DIAGNOSIS — G894 Chronic pain syndrome: Secondary | ICD-10-CM | POA: Diagnosis not present

## 2019-10-08 DIAGNOSIS — F1120 Opioid dependence, uncomplicated: Secondary | ICD-10-CM | POA: Diagnosis not present

## 2019-10-11 DIAGNOSIS — Z6841 Body Mass Index (BMI) 40.0 and over, adult: Secondary | ICD-10-CM | POA: Diagnosis not present

## 2019-10-11 DIAGNOSIS — F1120 Opioid dependence, uncomplicated: Secondary | ICD-10-CM | POA: Diagnosis not present

## 2019-10-11 DIAGNOSIS — G894 Chronic pain syndrome: Secondary | ICD-10-CM | POA: Diagnosis not present

## 2019-10-11 DIAGNOSIS — M25572 Pain in left ankle and joints of left foot: Secondary | ICD-10-CM | POA: Diagnosis not present

## 2019-10-12 DIAGNOSIS — F1120 Opioid dependence, uncomplicated: Secondary | ICD-10-CM | POA: Diagnosis not present

## 2019-10-15 DIAGNOSIS — G894 Chronic pain syndrome: Secondary | ICD-10-CM | POA: Diagnosis not present

## 2019-10-15 DIAGNOSIS — F1120 Opioid dependence, uncomplicated: Secondary | ICD-10-CM | POA: Diagnosis not present

## 2019-10-17 DIAGNOSIS — M25572 Pain in left ankle and joints of left foot: Secondary | ICD-10-CM | POA: Diagnosis not present

## 2019-10-18 DIAGNOSIS — F1120 Opioid dependence, uncomplicated: Secondary | ICD-10-CM | POA: Diagnosis not present

## 2019-10-18 DIAGNOSIS — G894 Chronic pain syndrome: Secondary | ICD-10-CM | POA: Diagnosis not present

## 2019-10-20 DIAGNOSIS — E291 Testicular hypofunction: Secondary | ICD-10-CM | POA: Diagnosis not present

## 2019-10-20 DIAGNOSIS — F1120 Opioid dependence, uncomplicated: Secondary | ICD-10-CM | POA: Diagnosis not present

## 2019-10-22 DIAGNOSIS — G894 Chronic pain syndrome: Secondary | ICD-10-CM | POA: Diagnosis not present

## 2019-10-22 DIAGNOSIS — F1120 Opioid dependence, uncomplicated: Secondary | ICD-10-CM | POA: Diagnosis not present

## 2019-10-24 DIAGNOSIS — S61210A Laceration without foreign body of right index finger without damage to nail, initial encounter: Secondary | ICD-10-CM | POA: Diagnosis not present

## 2019-10-25 DIAGNOSIS — G894 Chronic pain syndrome: Secondary | ICD-10-CM | POA: Diagnosis not present

## 2019-10-25 DIAGNOSIS — F1120 Opioid dependence, uncomplicated: Secondary | ICD-10-CM | POA: Diagnosis not present

## 2019-10-27 ENCOUNTER — Other Ambulatory Visit: Payer: Self-pay | Admitting: Orthopaedic Surgery

## 2019-10-27 DIAGNOSIS — M25572 Pain in left ankle and joints of left foot: Secondary | ICD-10-CM

## 2019-10-29 DIAGNOSIS — F1120 Opioid dependence, uncomplicated: Secondary | ICD-10-CM | POA: Diagnosis not present

## 2019-10-29 DIAGNOSIS — G894 Chronic pain syndrome: Secondary | ICD-10-CM | POA: Diagnosis not present

## 2019-11-01 DIAGNOSIS — M4010 Other secondary kyphosis, site unspecified: Secondary | ICD-10-CM | POA: Diagnosis not present

## 2019-11-01 DIAGNOSIS — F1120 Opioid dependence, uncomplicated: Secondary | ICD-10-CM | POA: Diagnosis not present

## 2019-11-01 DIAGNOSIS — M4325 Fusion of spine, thoracolumbar region: Secondary | ICD-10-CM | POA: Diagnosis not present

## 2019-11-01 DIAGNOSIS — G894 Chronic pain syndrome: Secondary | ICD-10-CM | POA: Diagnosis not present

## 2019-11-01 DIAGNOSIS — M542 Cervicalgia: Secondary | ICD-10-CM | POA: Diagnosis not present

## 2019-11-01 DIAGNOSIS — M4150 Other secondary scoliosis, site unspecified: Secondary | ICD-10-CM | POA: Diagnosis not present

## 2019-11-02 DIAGNOSIS — F1120 Opioid dependence, uncomplicated: Secondary | ICD-10-CM | POA: Diagnosis not present

## 2019-11-08 ENCOUNTER — Other Ambulatory Visit: Payer: Self-pay

## 2019-11-08 ENCOUNTER — Other Ambulatory Visit: Payer: Medicare HMO

## 2019-11-08 ENCOUNTER — Ambulatory Visit
Admission: RE | Admit: 2019-11-08 | Discharge: 2019-11-08 | Disposition: A | Discharge status: Home / Self Care | Payer: Medicare HMO | Source: Ambulatory Visit | Attending: Orthopaedic Surgery | Admitting: Orthopaedic Surgery

## 2019-11-08 DIAGNOSIS — M19072 Primary osteoarthritis, left ankle and foot: Secondary | ICD-10-CM | POA: Diagnosis not present

## 2019-11-08 DIAGNOSIS — M25572 Pain in left ankle and joints of left foot: Secondary | ICD-10-CM

## 2019-11-08 DIAGNOSIS — M7989 Other specified soft tissue disorders: Secondary | ICD-10-CM | POA: Diagnosis not present

## 2019-11-08 DIAGNOSIS — G894 Chronic pain syndrome: Secondary | ICD-10-CM | POA: Diagnosis not present

## 2019-11-08 DIAGNOSIS — F1120 Opioid dependence, uncomplicated: Secondary | ICD-10-CM | POA: Diagnosis not present

## 2019-11-09 DIAGNOSIS — F1120 Opioid dependence, uncomplicated: Secondary | ICD-10-CM | POA: Diagnosis not present

## 2019-11-12 DIAGNOSIS — G894 Chronic pain syndrome: Secondary | ICD-10-CM | POA: Diagnosis not present

## 2019-11-12 DIAGNOSIS — F1120 Opioid dependence, uncomplicated: Secondary | ICD-10-CM | POA: Diagnosis not present

## 2019-11-15 DIAGNOSIS — G894 Chronic pain syndrome: Secondary | ICD-10-CM | POA: Diagnosis not present

## 2019-11-15 DIAGNOSIS — F1120 Opioid dependence, uncomplicated: Secondary | ICD-10-CM | POA: Diagnosis not present

## 2019-11-16 DIAGNOSIS — E291 Testicular hypofunction: Secondary | ICD-10-CM | POA: Diagnosis not present

## 2019-11-16 DIAGNOSIS — M19072 Primary osteoarthritis, left ankle and foot: Secondary | ICD-10-CM | POA: Diagnosis not present

## 2019-11-16 DIAGNOSIS — F1120 Opioid dependence, uncomplicated: Secondary | ICD-10-CM | POA: Diagnosis not present

## 2019-11-16 DIAGNOSIS — M25572 Pain in left ankle and joints of left foot: Secondary | ICD-10-CM | POA: Diagnosis not present

## 2019-11-23 ENCOUNTER — Other Ambulatory Visit: Payer: Self-pay | Admitting: Orthopaedic Surgery

## 2019-11-29 DIAGNOSIS — M5030 Other cervical disc degeneration, unspecified cervical region: Secondary | ICD-10-CM | POA: Diagnosis not present

## 2019-11-29 DIAGNOSIS — M4325 Fusion of spine, thoracolumbar region: Secondary | ICD-10-CM | POA: Diagnosis not present

## 2019-11-29 DIAGNOSIS — M542 Cervicalgia: Secondary | ICD-10-CM | POA: Diagnosis not present

## 2019-11-29 DIAGNOSIS — M4010 Other secondary kyphosis, site unspecified: Secondary | ICD-10-CM | POA: Diagnosis not present

## 2019-11-29 DIAGNOSIS — M47892 Other spondylosis, cervical region: Secondary | ICD-10-CM | POA: Diagnosis not present

## 2019-11-30 DIAGNOSIS — G894 Chronic pain syndrome: Secondary | ICD-10-CM | POA: Diagnosis not present

## 2019-11-30 DIAGNOSIS — G4730 Sleep apnea, unspecified: Secondary | ICD-10-CM | POA: Diagnosis not present

## 2019-11-30 DIAGNOSIS — R7301 Impaired fasting glucose: Secondary | ICD-10-CM | POA: Diagnosis not present

## 2019-12-03 DIAGNOSIS — G894 Chronic pain syndrome: Secondary | ICD-10-CM | POA: Diagnosis not present

## 2019-12-06 DIAGNOSIS — G894 Chronic pain syndrome: Secondary | ICD-10-CM | POA: Diagnosis not present

## 2019-12-09 DIAGNOSIS — H8110 Benign paroxysmal vertigo, unspecified ear: Secondary | ICD-10-CM | POA: Diagnosis not present

## 2019-12-09 NOTE — Pre-Procedure Instructions (Signed)
Mark [name]  12/09/2019      [facility] - [place], [place] - [address] [place] [address] Phone: [phone] Fax: [phone]  [facility] - [place], [place] - [address] [place] [address] Phone: [phone] Fax: [phone]    Your procedure is scheduled on July 6.  Report to [facility] Entrance A at 5:30 A.M.  Call this number if you have problems the morning of surgery:  [phone]   Remember:  Do not eat or drink after midnight.  You may drink clear liquids until 4:30 A.M. .  Clear liquids allowed are:                    Water, Juice (non-citric and without pulp - diabetics please choose diet or no sugar options), Carbonated beverages - (diabetics please choose diet or no sugar options), Clear Tea, Black Coffee only (no creamer, milk or cream including half and half), Plain Jell-O only (diabetics please choose diet or no sugar options), Gatorade (diabetics please choose diet or no sugar options) and Plain Popsicles only                    Enhanced Recovery after Surgery for Orthopedics Enhanced Recovery after Surgery is a protocol used to improve the stress on your body and your recovery after surgery.  Patient Instructions  . The night before surgery:  o No food after midnight. ONLY clear liquids after midnight  .  Marland Kitchen The day of surgery (if you do NOT have diabetes):  o Drink ONE (1) Pre-Surgery Clear Ensure as directed.   o This drink was given to you during your hospital  pre-op appointment visit. o The pre-op nurse will instruct you on the time to drink the  Pre-Surgery Ensure depending on your surgery time. o Finish the drink at the designated time by the pre-op nurse.  o Nothing else to drink after completing the  Pre-Surgery Clear Ensure.  . The day of surgery (if you have diabetes): o  o Drink ONE (1) Gatorade 2 (G2) as directed. o This drink was given to you during your hospital   pre-op appointment visit.  o The pre-op nurse will instruct you on the time to drink the   Gatorade 2 (G2) depending on your surgery time. o Color of the Gatorade may vary. Red is not allowed. o Nothing else to drink after completing the  Gatorade 2 (G2).         If you have questions, please contact your surgeon's office.     Take these medicines the morning of surgery with A SIP OF WATER :              Tylenol if needed             Bupropion (wellbutrin)             Methocarbamol (robaxin)             nucynta             Afrin if needed             Sertraline (zoloft)              Eye drops if needed              rexulti              Buprenorphine (  subutex)                  7 days prior to surgery STOP taking any Aspirin/ Eliquis (unless otherwise instructed by your surgeon), Aleve, Naproxen, Ibuprofen, Motrin, Advil, Goody's, BC's, all herbal medications, fish oil, and all vitamins.   Follow your surgeon's instructions on when to stop Eliquis.  If no instructions were given by your surgeon then you will need to call the office to get those instructions.                                   Do not wear jewelry.  Do not wear lotions, powders, or perfumes, or deodorant.  Do not shave 48 hours prior to surgery.  Men may shave face and neck.  Do not bring valuables to the hospital.  [facility] is not responsible for any belongings or valuables.  Contacts, dentures or bridgework may not be worn into surgery.  Leave your suitcase in the car.  After surgery it may be brought to your room.  For patients admitted to the hospital, discharge time will be determined by your treatment team.  Patients discharged the day of surgery will not be allowed to drive home.    Special instructions:  - Preparing For Surgery  Before surgery, you can play an important role. Because skin is not sterile, your skin needs to be as free of germs as possible. You can reduce the number of  germs on your skin by washing with CHG (chlorahexidine gluconate) Soap before surgery.  CHG is an antiseptic cleaner which kills germs and bonds with the skin to continue killing germs even after washing.    Oral Hygiene is also important to reduce your risk of infection.  Remember - BRUSH YOUR TEETH THE MORNING OF SURGERY WITH YOUR REGULAR TOOTHPASTE  Please do not use if you have an allergy to CHG or antibacterial soaps. If your skin becomes reddened/irritated stop using the CHG.  Do not shave (including legs and underarms) for at least 48 hours prior to first CHG shower. It is OK to shave your face.  Please follow these instructions carefully.   1. Shower the NIGHT BEFORE SURGERY and the MORNING OF SURGERY with CHG.   2. If you chose to wash your hair, wash your hair first as usual with your normal shampoo.  3. After you shampoo, rinse your hair and body thoroughly to remove the shampoo.  4. Use CHG as you would any other liquid soap. You can apply CHG directly to the skin and wash gently with a scrungie or a clean washcloth.   5. Apply the CHG Soap to your body ONLY FROM THE NECK DOWN.  Do not use on open wounds or open sores. Avoid contact with your eyes, ears, mouth and genitals (private parts). Wash Face and genitals (private parts)  with your normal soap.  6. Wash thoroughly, paying special attention to the area where your surgery will be performed.  7. Thoroughly rinse your body with warm water from the neck down.  8. DO NOT shower/wash with your normal soap after using and rinsing off the CHG Soap.  9. Pat yourself dry with a CLEAN TOWEL.  10. Wear CLEAN PAJAMAS to bed the night before surgery, wear comfortable clothes the morning of surgery  11. Place CLEAN SHEETS on your bed the night of your first shower and DO NOT SLEEP WITH  PETS.    Day of Surgery:  Do not apply any deodorants/lotions.  Please wear clean clothes to the hospital/surgery center.   Remember to brush  your teeth WITH YOUR REGULAR TOOTHPASTE.    Please read over the following fact sheets that you were given. Coughing and Deep Breathing and Surgical Site Infection Prevention

## 2019-12-10 DIAGNOSIS — G894 Chronic pain syndrome: Secondary | ICD-10-CM | POA: Diagnosis not present

## 2019-12-10 DIAGNOSIS — M545 Low back pain: Secondary | ICD-10-CM | POA: Diagnosis not present

## 2019-12-12 ENCOUNTER — Inpatient Hospital Stay (HOSPITAL_COMMUNITY)
Admission: RE | Admit: 2019-12-12 | Discharge: 2019-12-12 | Disposition: A | Discharge status: Home / Self Care | Payer: Medicare HMO | Source: Ambulatory Visit

## 2019-12-12 DIAGNOSIS — M19072 Primary osteoarthritis, left ankle and foot: Secondary | ICD-10-CM | POA: Diagnosis not present

## 2019-12-13 DIAGNOSIS — M545 Low back pain: Secondary | ICD-10-CM | POA: Diagnosis not present

## 2019-12-13 DIAGNOSIS — G894 Chronic pain syndrome: Secondary | ICD-10-CM | POA: Diagnosis not present

## 2019-12-14 DIAGNOSIS — D638 Anemia in other chronic diseases classified elsewhere: Secondary | ICD-10-CM | POA: Diagnosis not present

## 2019-12-14 DIAGNOSIS — D649 Anemia, unspecified: Secondary | ICD-10-CM | POA: Diagnosis not present

## 2019-12-14 DIAGNOSIS — E039 Hypothyroidism, unspecified: Secondary | ICD-10-CM | POA: Diagnosis not present

## 2019-12-14 DIAGNOSIS — E782 Mixed hyperlipidemia: Secondary | ICD-10-CM | POA: Diagnosis not present

## 2019-12-14 DIAGNOSIS — D509 Iron deficiency anemia, unspecified: Secondary | ICD-10-CM | POA: Diagnosis not present

## 2019-12-14 DIAGNOSIS — R7301 Impaired fasting glucose: Secondary | ICD-10-CM | POA: Diagnosis not present

## 2019-12-17 DIAGNOSIS — M545 Low back pain: Secondary | ICD-10-CM | POA: Diagnosis not present

## 2019-12-19 ENCOUNTER — Other Ambulatory Visit (HOSPITAL_COMMUNITY): Payer: Medicare HMO

## 2019-12-20 DIAGNOSIS — G894 Chronic pain syndrome: Secondary | ICD-10-CM | POA: Diagnosis not present

## 2019-12-22 ENCOUNTER — Encounter (HOSPITAL_COMMUNITY): Admission: RE | Admit: 2019-12-22 | Payer: Medicare Other | Source: Ambulatory Visit

## 2019-12-23 ENCOUNTER — Inpatient Hospital Stay (HOSPITAL_COMMUNITY): Admission: RE | Admit: 2019-12-23 | Payer: Medicare Other | Source: Ambulatory Visit

## 2019-12-27 ENCOUNTER — Encounter (HOSPITAL_COMMUNITY): Admission: RE | Payer: Self-pay | Source: Home / Self Care

## 2019-12-27 ENCOUNTER — Ambulatory Visit (HOSPITAL_COMMUNITY): Admission: RE | Admit: 2019-12-27 | Payer: Medicare Other | Source: Home / Self Care | Admitting: Orthopaedic Surgery

## 2019-12-27 SURGERY — ARTHRODESIS ANKLE
Anesthesia: General | Site: Ankle | Laterality: Left

## 2019-12-28 DIAGNOSIS — G894 Chronic pain syndrome: Secondary | ICD-10-CM | POA: Diagnosis not present

## 2019-12-28 DIAGNOSIS — G4730 Sleep apnea, unspecified: Secondary | ICD-10-CM | POA: Diagnosis not present

## 2019-12-28 DIAGNOSIS — R7301 Impaired fasting glucose: Secondary | ICD-10-CM | POA: Diagnosis not present

## 2020-01-14 DIAGNOSIS — G894 Chronic pain syndrome: Secondary | ICD-10-CM | POA: Diagnosis not present

## 2020-01-17 DIAGNOSIS — G894 Chronic pain syndrome: Secondary | ICD-10-CM | POA: Diagnosis not present

## 2020-01-23 DIAGNOSIS — J309 Allergic rhinitis, unspecified: Secondary | ICD-10-CM | POA: Diagnosis not present

## 2020-01-23 DIAGNOSIS — G4730 Sleep apnea, unspecified: Secondary | ICD-10-CM | POA: Diagnosis not present

## 2020-01-23 DIAGNOSIS — H8113 Benign paroxysmal vertigo, bilateral: Secondary | ICD-10-CM | POA: Diagnosis not present

## 2020-01-23 DIAGNOSIS — R519 Headache, unspecified: Secondary | ICD-10-CM | POA: Diagnosis not present

## 2020-02-14 DIAGNOSIS — M5030 Other cervical disc degeneration, unspecified cervical region: Secondary | ICD-10-CM | POA: Diagnosis not present

## 2020-02-14 DIAGNOSIS — M4010 Other secondary kyphosis, site unspecified: Secondary | ICD-10-CM | POA: Diagnosis not present

## 2020-02-14 DIAGNOSIS — M47892 Other spondylosis, cervical region: Secondary | ICD-10-CM | POA: Diagnosis not present

## 2020-02-14 DIAGNOSIS — M4150 Other secondary scoliosis, site unspecified: Secondary | ICD-10-CM | POA: Diagnosis not present

## 2020-02-14 DIAGNOSIS — M4325 Fusion of spine, thoracolumbar region: Secondary | ICD-10-CM | POA: Diagnosis not present

## 2020-03-09 DIAGNOSIS — D638 Anemia in other chronic diseases classified elsewhere: Secondary | ICD-10-CM | POA: Diagnosis not present

## 2020-03-09 DIAGNOSIS — D509 Iron deficiency anemia, unspecified: Secondary | ICD-10-CM | POA: Diagnosis not present

## 2020-03-09 DIAGNOSIS — G894 Chronic pain syndrome: Secondary | ICD-10-CM | POA: Diagnosis not present

## 2020-03-10 DIAGNOSIS — Z03818 Encounter for observation for suspected exposure to other biological agents ruled out: Secondary | ICD-10-CM | POA: Diagnosis not present

## 2020-03-13 DIAGNOSIS — Z03818 Encounter for observation for suspected exposure to other biological agents ruled out: Secondary | ICD-10-CM | POA: Diagnosis not present

## 2020-03-14 ENCOUNTER — Encounter: Payer: Self-pay | Admitting: Gastroenterology

## 2020-03-17 DIAGNOSIS — Z03818 Encounter for observation for suspected exposure to other biological agents ruled out: Secondary | ICD-10-CM | POA: Diagnosis not present

## 2020-03-20 ENCOUNTER — Ambulatory Visit (INDEPENDENT_AMBULATORY_CARE_PROVIDER_SITE_OTHER): Payer: No Typology Code available for payment source | Admitting: Gastroenterology

## 2020-03-20 ENCOUNTER — Encounter: Payer: Self-pay | Admitting: Gastroenterology

## 2020-03-20 VITALS — BP 132/80 | HR 94 | Ht 70.0 in | Wt 307.0 lb

## 2020-03-20 DIAGNOSIS — D509 Iron deficiency anemia, unspecified: Secondary | ICD-10-CM

## 2020-03-20 DIAGNOSIS — Z03818 Encounter for observation for suspected exposure to other biological agents ruled out: Secondary | ICD-10-CM | POA: Diagnosis not present

## 2020-03-20 MED ORDER — NA SULFATE-K SULFATE-MG SULF 17.5-3.13-1.6 GM/177ML PO SOLN
1.0000 | Freq: Once | ORAL | 0 refills | Status: AC
Start: 2020-03-20 — End: 2020-03-20

## 2020-03-20 NOTE — Patient Instructions (Signed)
If you are age 55 or older, your body mass index should be between 23-30. Your Body mass index is 44.05 kg/m. If this is out of the aforementioned range listed, please consider follow up with your Primary Care Provider.  If you are age 73 or younger, your body mass index should be between 19-25. Your Body mass index is 44.05 kg/m. If this is out of the aformentioned range listed, please consider follow up with your Primary Care Provider.   You have been scheduled for an endoscopy and colonoscopy. Please follow the written instructions given to you at your visit today. Please pick up your prep supplies at the pharmacy within the next 1-3 days. If you use inhalers (even only as needed), please bring them with you on the day of your procedure.  Please stop iron 5 days before your procedure   Thank you for choosing me and [facility].  Venita Lick. Pleas Koch., MD., Clementeen Graham

## 2020-03-20 NOTE — Progress Notes (Signed)
History of Present Illness: This is 54 year old male referred by Benita Stabile, MD for the evaluation of iron deficiency anemia.  He underwent a gastric bypass in 2011 at [facility].  He states he was maintained on iron for several years. He discontinued iron around the time of his back surgery and became slightly anemic so iron was resumed.  Most recent hemoglobin was 12.8 with a normal MCV.  He has not previously had colonoscopy or EGD. Denies weight loss, abdominal pain, constipation, diarrhea, change in stool caliber, melena, hematochezia, nausea, vomiting, dysphagia, reflux symptoms, chest pain.     Allergies  Allergen Reactions   Asa [Aspirin] Other (See Comments)    gastric bypass    Ibuprofen Other (See Comments)    gastric bypass   Penicillins Hives and Other (See Comments)    Has patient had a PCN reaction causing immediate rash, facial/tongue/throat swelling, SOB or lightheadedness with hypotension: Yes Has patient had a PCN reaction causing severe rash involving mucus membranes or skin necrosis: No Has patient had a PCN reaction that required hospitalization unknown "probably not" Has patient had a PCN reaction occurring within the last 10 years: No If all of the above answers are "NO", then may proceed with Cephalosporin use.   Adhesive [Tape] Rash and Other (See Comments)    Please use paper tape, can use cloth tape  States plastic bandaids irritate   Morphine And Related Nausea And Vomiting and Other (See Comments)    headaches   Outpatient Medications Prior to Visit  Medication Sig Dispense Refill   acetaminophen (TYLENOL) 500 MG tablet Take 1,000 mg by mouth every 8 (eight) hours as needed for moderate pain or headache.      amphetamine-dextroamphetamine (ADDERALL) 20 MG tablet Take 20 mg by mouth in the morning, at noon, in the evening, and at bedtime.     buprenorphine (SUBUTEX) 8 MG SUBL SL tablet Place 8 mg under the tongue in the morning, at noon, in the  evening, and at bedtime.     ferrous sulfate 325 (65 FE) MG tablet Take 325 mg by mouth 2 (two) times daily with a meal.     lamoTRIgine (LAMICTAL) 100 MG tablet Take 100 mg by mouth at bedtime.      levocetirizine (XYZAL) 5 MG tablet Take 5 mg by mouth every evening.     magnesium oxide (MAG-OX) 400 MG tablet Take 400 mg by mouth daily.     methocarbamol (ROBAXIN) 750 MG tablet Take 750 mg by mouth in the morning, at noon, in the evening, and at bedtime.     NUCYNTA ER 200 MG TB12 Take 200 mg by mouth in the morning and at bedtime.     oxymetazoline (AFRIN) 0.05 % nasal spray Place 1 spray into both nostrils 4 (four) times daily as needed (nasal congestion).      potassium chloride (KLOR-CON) 10 MEQ tablet Take 10 mEq by mouth daily.     REXULTI 4 MG TABS Take 4 mg by mouth daily.     sertraline (ZOLOFT) 100 MG tablet Take 100 mg by mouth daily.     Tapentadol HCl (NUCYNTA) 100 MG TABS Take 100 mg by mouth in the morning, at noon, in the evening, and at bedtime.     testosterone cypionate (DEPOTESTOSTERONE CYPIONATE) 200 MG/ML injection Inject 200 mg into the muscle every 14 (fourteen) days.     apixaban (ELIQUIS) 2.5 MG TABS tablet Take 1 tablet (2.5 mg total) by  mouth 2 (two) times daily. (Patient not taking: Reported on 11/29/2019) 28 tablet 0   buPROPion (WELLBUTRIN XL) 300 MG 24 hr tablet Take 300 mg by mouth daily.   4   docusate sodium (COLACE) 100 MG capsule Take 1 capsule (100 mg total) by mouth 2 (two) times daily. To prevent constipation while taking pain medication. (Patient not taking: Reported on 11/29/2019) 60 capsule 0   ondansetron (ZOFRAN) 4 MG tablet Take 1 tablet (4 mg total) by mouth every 8 (eight) hours as needed for nausea or vomiting. (Patient not taking: Reported on 11/29/2019) 40 tablet 0   oxyCODONE (OXYCONTIN) 40 mg 12 hr tablet Take 1 tablet (40 mg total) by mouth 2 (two) times daily. (Patient not taking: Reported on 11/29/2019) 30 tablet 0   oxycodone  (ROXICODONE) 30 MG immediate release tablet Take 1 tablet (30 mg total) by mouth every 4 (four) hours as needed for pain. (Patient not taking: Reported on 11/29/2019) 90 tablet 0   tetrahydrozoline 0.05 % ophthalmic solution Place 1-2 drops into both eyes daily as needed (for dry/irritated eyes).      No facility-administered medications prior to visit.   Past Medical History:  Diagnosis Date   Anemia    low iron   Anxiety    Astigmatism of left eye    Depression    Fatigue    GERD (gastroesophageal reflux disease)    heartburn occ - better since having gastric bypass   Headache    migraines - none for years, sinus headaches at times   Hyperlipemia    Hypertension    no meds   Knee pain    Obesity, unspecified    Osteoarthritis    Pneumonia    as child    Shoulder pain    Trigger finger (acquired)    Unspecified sleep apnea    uses cpap   Past Surgical History:  Procedure Laterality Date   CARPAL TUNNEL RELEASE Bilateral    FRACTURE SURGERY     all fingers expect l thumb   GASTRIC BYPASS  2011   JOINT REPLACEMENT     KNEE ARTHROSCOPY Right    KNEE ARTHROSCOPY Left 11/15/14   SHOULDER SURGERY Left 2006   SHOULDER SURGERY Right 2012 and 2017   TOTAL HIP ARTHROPLASTY Right 08/05/2016   Procedure: TOTAL HIP ARTHROPLASTY ANTERIOR APPROACH;  Surgeon: Sheral Apley, MD;  Location: [facility] OR;  Service: Orthopedics;  Laterality: Right;   TOTAL KNEE ARTHROPLASTY Left 07/27/2015   Procedure: LEFT TOTAL KNEE ARTHROPLASTY;  Surgeon: Frederico Hamman, MD;  Location[facility];  Service: Orthopedics;  Laterality: Left;   TOTAL KNEE ARTHROPLASTY Right 12/19/2016   Procedure: TOTAL KNEE ARTHROPLASTY;  Surgeon: Frederico Hamman, MD;  Location: [facility] OR;  Service: Orthopedics;  Laterality: Right;   Social History   Socioeconomic History   Marital status: Married    Spouse name: Not on file   Number of children: 3   Years of education: HS   Highest education level:  Not on file  Occupational History   Occupation: [employer]  Tobacco Use   Smoking status: Never Smoker   Smokeless tobacco: Former Neurosurgeon    Types: Chew  Substance and Sexual Activity   Alcohol use: Yes    Alcohol/week: 0.0 standard drinks    Comment: One every 1-2 months   Drug use: No   Sexual activity: Yes    Birth control/protection: None  Other Topics Concern   Not on file  Social History Narrative  Drinks 10 cups of coffee    Social Determinants of Health   Financial Resource Strain:    Difficulty of Paying Living Expenses: Not on file  Food Insecurity:    Worried About Programme researcher, broadcasting/film/video in the Last Year: Not on file   [employer] of Food in the Last Year: Not on file  Transportation Needs:    Lack of Transportation (Medical): Not on file   Lack of Transportation (Non-Medical): Not on file  Physical Activity:    Days of Exercise per Week: Not on file   Minutes of Exercise per Session: Not on file  Stress:    Feeling of Stress : Not on file  Social Connections:    Frequency of Communication with Friends and Family: Not on file   Frequency of Social Gatherings with Friends and Family: Not on file   Attends Religious Services: Not on file   Active Member of Clubs or Organizations: Not on file   Attends Banker Meetings: Not on file   Marital Status: Not on file   Family History  Problem Relation Age of Onset   Heart failure Mother    Diabetes Mother    COPD Mother    Cancer Father    Arthritis Father    Colon cancer Neg Hx       Review of Systems: Pertinent positive and negative review of systems were noted in the above HPI section. All other review of systems were otherwise negative.   Physical Exam: General: Well developed, well nourished, no acute distress Head: Normocephalic and atraumatic Eyes:  sclerae anicteric, EOMI Ears: Normal auditory acuity Mouth: Not examined, mask on during Covid-19 pandemic Neck:  Supple, no masses or thyromegaly Lungs: Clear throughout to auscultation Heart: Regular rate and rhythm; no murmurs, rubs or bruits Abdomen: Soft, non tender and non distended. No masses, hepatosplenomegaly or hernias noted. Normal Bowel sounds Rectal: deferred to colonoscopy Musculoskeletal: Symmetrical with no gross deformities  Skin: No lesions on visible extremities Pulses:  Normal pulses noted Extremities: No clubbing, cyanosis, edema or deformities noted Neurological: Alert oriented x 4, grossly nonfocal Cervical Nodes:  No significant cervical adenopathy Inguinal Nodes: No significant inguinal adenopathy Psychological:  Alert and cooperative. Normal mood and affect   Assessment and Recommendations:  1. IDA likely related to poor iron absorption post bypass. R/O colorectal neoplasm, ulcer, AVM, etc. Continue FeSO4  325 mg po qd. Schedule colonoscopy and EGD. The risks (including bleeding, perforation, infection, missed lesions, medication reactions and possible hospitalization or surgery if complications occur), benefits, and alternatives to colonoscopy with possible biopsy and possible polypectomy were discussed with the patient and they consent to proceed. The risks (including bleeding, perforation, infection, missed lesions, medication reactions and possible hospitalization or surgery if complications occur), benefits, and alternatives to endoscopy with possible biopsy and possible dilation were discussed with the patient and they consent to proceed.   2. S/P gastric bypass in 2011.     cc: Benita Stabile, MD [address] [place],  [place] [address]

## 2020-03-27 DIAGNOSIS — Z03818 Encounter for observation for suspected exposure to other biological agents ruled out: Secondary | ICD-10-CM | POA: Diagnosis not present

## 2020-03-29 ENCOUNTER — Encounter: Payer: Medicare Other | Admitting: Gastroenterology

## 2020-04-03 DIAGNOSIS — Z03818 Encounter for observation for suspected exposure to other biological agents ruled out: Secondary | ICD-10-CM | POA: Diagnosis not present

## 2020-04-04 DIAGNOSIS — D509 Iron deficiency anemia, unspecified: Secondary | ICD-10-CM | POA: Diagnosis not present

## 2020-04-04 DIAGNOSIS — D638 Anemia in other chronic diseases classified elsewhere: Secondary | ICD-10-CM | POA: Diagnosis not present

## 2020-04-04 DIAGNOSIS — R7301 Impaired fasting glucose: Secondary | ICD-10-CM | POA: Diagnosis not present

## 2020-04-10 DIAGNOSIS — Z03818 Encounter for observation for suspected exposure to other biological agents ruled out: Secondary | ICD-10-CM | POA: Diagnosis not present

## 2020-04-17 DIAGNOSIS — Z03818 Encounter for observation for suspected exposure to other biological agents ruled out: Secondary | ICD-10-CM | POA: Diagnosis not present

## 2020-04-19 DIAGNOSIS — K148 Other diseases of tongue: Secondary | ICD-10-CM | POA: Diagnosis not present

## 2020-04-24 DIAGNOSIS — Z03818 Encounter for observation for suspected exposure to other biological agents ruled out: Secondary | ICD-10-CM | POA: Diagnosis not present

## 2020-05-01 DIAGNOSIS — Z03818 Encounter for observation for suspected exposure to other biological agents ruled out: Secondary | ICD-10-CM | POA: Diagnosis not present

## 2020-05-03 DIAGNOSIS — K148 Other diseases of tongue: Secondary | ICD-10-CM | POA: Diagnosis not present

## 2020-05-08 DIAGNOSIS — Z03818 Encounter for observation for suspected exposure to other biological agents ruled out: Secondary | ICD-10-CM | POA: Diagnosis not present

## 2020-05-09 ENCOUNTER — Encounter: Payer: Self-pay | Admitting: Gastroenterology

## 2020-05-09 ENCOUNTER — Other Ambulatory Visit: Payer: Self-pay

## 2020-05-09 ENCOUNTER — Ambulatory Visit (AMBULATORY_SURGERY_CENTER): Payer: No Typology Code available for payment source | Admitting: Gastroenterology

## 2020-05-09 VITALS — BP 108/58 | HR 76 | Temp 98.8°F | Resp 12 | Ht 70.0 in | Wt 307.0 lb

## 2020-05-09 DIAGNOSIS — D509 Iron deficiency anemia, unspecified: Secondary | ICD-10-CM

## 2020-05-09 DIAGNOSIS — K640 First degree hemorrhoids: Secondary | ICD-10-CM | POA: Diagnosis not present

## 2020-05-09 MED ORDER — SODIUM CHLORIDE 0.9 % IV SOLN: 500.0000 mL | Freq: Once | INTRAVENOUS | Status: DC

## 2020-05-09 NOTE — Progress Notes (Signed)
Vs-CW in adm 

## 2020-05-09 NOTE — Op Note (Signed)
[facility] Patient Name: Mark [name] Procedure Date: 05/09/2020 8:02 AM MRN: [id number] Endoscopist: Meryl Dare , MD Age: 54 Referring MD:  Date of Birth: [birth year] Gender: Male Account #: [id number] Procedure:                Colonoscopy Indications:              Unexplained iron deficiency anemia Medicines:                Monitored Anesthesia Care Procedure:                Pre-Anesthesia Assessment:                           - Prior to the procedure, a History and Physical                            was performed, and patient medications and                            allergies were reviewed. The patient's tolerance of                            previous anesthesia was also reviewed. The risks                            and benefits of the procedure and the sedation                            options and risks were discussed with the patient.                            All questions were answered, and informed consent                            was obtained. Prior Anticoagulants: The patient has                            taken no previous anticoagulant or antiplatelet                            agents. ASA Grade Assessment: III - A patient with                            severe systemic disease. After reviewing the risks                            and benefits, the patient was deemed in                            satisfactory condition to undergo the procedure.                           After obtaining informed consent, the colonoscope  was passed under direct vision. Throughout the                            procedure, the patient's blood pressure, pulse, and                            oxygen saturations were monitored continuously. The                            Colonoscope was introduced through the anus and                            advanced to the the terminal ileum, with                            identification of the appendiceal  orifice and IC                            valve. The ileocecal valve, appendiceal orifice,                            and rectum were photographed. The quality of the                            bowel preparation was adequate after extensive                            lavage, suction. The colonoscopy was performed                            without difficulty. The patient tolerated the                            procedure well. Scope In: 8:08:32 AM Scope Out: 8:31:23 AM Scope Withdrawal Time: 0 hours 17 minutes 12 seconds  Total Procedure Duration: 0 hours 22 minutes 51 seconds  Findings:                 The perianal and digital rectal examinations were                            normal.                           Internal hemorrhoids were found during                            retroflexion. The hemorrhoids were small and Grade                            I (internal hemorrhoids that do not prolapse).                           The exam was otherwise without abnormality on  direct and retroflexion views. Complications:            No immediate complications. Estimated blood loss:                            None. Estimated Blood Loss:     Estimated blood loss: none. Impression:               - Internal hemorrhoids.                           - The examination was otherwise normal on direct                            and retroflexion views.                           - No specimens collected. Recommendation:           - Repeat colonoscopy in 7 years for screening                            purposes with a more extensive bowel prep.                           - Patient has a contact number available for                            emergencies. The signs and symptoms of potential                            delayed complications were discussed with the                            patient. Return to normal activities tomorrow.                            Written discharge  instructions were provided to the                            patient.                           - Resume previous diet.                           - Continue present medications. Meryl Dare, MD 05/09/2020 8:39:32 AM This report has been signed electronically.

## 2020-05-09 NOTE — Op Note (Signed)
[facility] Patient Name: Mark [name] Procedure Date: 05/09/2020 8:02 AM MRN: [id number] Endoscopist: Meryl Dare , MD Age: 54 Referring MD:  Date of Birth: 08/13/1965 Gender: Male Account #: [id number] Procedure:                Upper GI endoscopy Indications:              Unexplained iron deficiency anemia Medicines:                Monitored Anesthesia Care Procedure:                Pre-Anesthesia Assessment:                           - Prior to the procedure, a History and Physical                            was performed, and patient medications and                            allergies were reviewed. The patient's tolerance of                            previous anesthesia was also reviewed. The risks                            and benefits of the procedure and the sedation                            options and risks were discussed with the patient.                            All questions were answered, and informed consent                            was obtained. Prior Anticoagulants: The patient has                            taken no previous anticoagulant or antiplatelet                            agents. ASA Grade Assessment: III - A patient with                            severe systemic disease. After reviewing the risks                            and benefits, the patient was deemed in                            satisfactory condition to undergo the procedure.                           After obtaining informed consent, the endoscope was  passed under direct vision. Throughout the                            procedure, the patient's blood pressure, pulse, and                            oxygen saturations were monitored continuously. The                            Endoscope was introduced through the mouth, and                            advanced to the efferent jejunal loop. The upper GI                            endoscopy was  accomplished without difficulty. The                            patient tolerated the procedure well. Scope In: Scope Out: Findings:                 The examined esophagus was normal.                           Evidence of a gastric bypass was found. A gastric                            pouch with a normal size was found. The                            gastrojejunal anastomosis was characterized by                            healthy appearing mucosa. This was traversed. The                            pouch-to-jejunum limb was characterized by healthy                            appearing mucosa. The affterent and efferent limbs                            appeared normal. Biopsies obtained in both limbs. Complications:            No immediate complications. Estimated Blood Loss:     Estimated blood loss was minimal. Impression:               - Normal esophagus.                           - Gastric bypass with a normal-sized pouch.                            Gastrojejunal anastomosis characterized by healthy  appearing mucosa.                           - Afferent and efferent limbs appeared normal.                            Biospies obtained. Recommendation:           - Patient has a contact number available for                            emergencies. The signs and symptoms of potential                            delayed complications were discussed with the                            patient. Return to normal activities tomorrow.                            Written discharge instructions were provided to the                            patient.                           - Resume previous diet.                           - Continue present medications including FeSO4 bid                            with meals.                           - Follow up with PCP.                           - Await pathology results. Meryl Dare, MD 05/09/2020 8:44:12 AM This report has  been signed electronically.

## 2020-05-09 NOTE — Progress Notes (Signed)
To PACU, VSS. Report to [email or web address] 

## 2020-05-09 NOTE — Progress Notes (Signed)
Called to room to assist during endoscopic procedure.  Patient ID and intended procedure confirmed with present staff. Received instructions for my participation in the procedure from the performing physician.  

## 2020-05-09 NOTE — Patient Instructions (Signed)
Please read handouts provided. Continue present medications, including FeSO4 twice with meals. Await pathology results. Follow-up with PCP.      YOU HAD AN ENDOSCOPIC PROCEDURE TODAY AT THE [facility]:   Refer to the procedure report that was given to you for any specific questions about what was found during the examination.  If the procedure report does not answer your questions, please call your gastroenterologist to clarify.  If you requested that your care partner not be given the details of your procedure findings, then the procedure report has been included in a sealed envelope for you to review at your convenience later.  YOU SHOULD EXPECT: Some feelings of bloating in the abdomen. Passage of more gas than usual.  Walking can help get rid of the air that was put into your GI tract during the procedure and reduce the bloating. If you had a lower endoscopy (such as a colonoscopy or flexible sigmoidoscopy) you may notice spotting of blood in your stool or on the toilet paper. If you underwent a bowel prep for your procedure, you may not have a normal bowel movement for a few days.  Please Note:  You might notice some irritation and congestion in your nose or some drainage.  This is from the oxygen used during your procedure.  There is no need for concern and it should clear up in a day or so.  SYMPTOMS TO REPORT IMMEDIATELY:   Following lower endoscopy (colonoscopy or flexible sigmoidoscopy):  Excessive amounts of blood in the stool  Significant tenderness or worsening of abdominal pains  Swelling of the abdomen that is new, acute  Fever of 100F or higher   Following upper endoscopy (EGD)  Vomiting of blood or coffee ground material  New chest pain or pain under the shoulder blades  Painful or persistently difficult swallowing  New shortness of breath  Fever of 100F or higher  Black, tarry-looking stools  For urgent or emergent issues, a gastroenterologist can be  reached at any hour by calling [phone]. Do not use MyChart messaging for urgent concerns.    DIET:  We do recommend a small meal at first, but then you may proceed to your regular diet.  Drink plenty of fluids but you should avoid alcoholic beverages for 24 hours.  ACTIVITY:  You should plan to take it easy for the rest of today and you should NOT DRIVE or use heavy machinery until tomorrow (because of the sedation medicines used during the test).    FOLLOW UP: Our staff will call the number listed on your records 48-72 hours following your procedure to check on you and address any questions or concerns that you may have regarding the information given to you following your procedure. If we do not reach you, we will leave a message.  We will attempt to reach you two times.  During this call, we will ask if you have developed any symptoms of COVID 19. If you develop any symptoms (ie: fever, flu-like symptoms, shortness of breath, cough etc.) before then, please call [phone].  If you test positive for Covid 19 in the 2 weeks post procedure, please call and report this information to [place].    If any biopsies were taken you will be contacted by phone or by letter within the next 1-3 weeks.  Please call us at [phone] if you have not heard about the biopsies in 3 weeks.    SIGNATURES/CONFIDENTIALITY: You and/or your care partner have signed  paperwork which will be entered into your electronic medical record.  These signatures attest to the fact that that the information above on your After Visit Summary has been reviewed and is understood.  Full responsibility of the confidentiality of this discharge information lies with you and/or your care-partner.

## 2020-05-11 ENCOUNTER — Telehealth: Payer: Self-pay

## 2020-05-11 NOTE — Telephone Encounter (Signed)
Incorrect number

## 2020-05-12 DIAGNOSIS — Z03818 Encounter for observation for suspected exposure to other biological agents ruled out: Secondary | ICD-10-CM | POA: Diagnosis not present

## 2020-05-15 DIAGNOSIS — Z03818 Encounter for observation for suspected exposure to other biological agents ruled out: Secondary | ICD-10-CM | POA: Diagnosis not present

## 2020-05-21 ENCOUNTER — Encounter: Payer: Self-pay | Admitting: Gastroenterology

## 2020-05-22 DIAGNOSIS — Z03818 Encounter for observation for suspected exposure to other biological agents ruled out: Secondary | ICD-10-CM | POA: Diagnosis not present

## 2020-05-27 DIAGNOSIS — Z03818 Encounter for observation for suspected exposure to other biological agents ruled out: Secondary | ICD-10-CM | POA: Diagnosis not present

## 2020-05-29 DIAGNOSIS — Z03818 Encounter for observation for suspected exposure to other biological agents ruled out: Secondary | ICD-10-CM | POA: Diagnosis not present

## 2020-06-05 DIAGNOSIS — Z03818 Encounter for observation for suspected exposure to other biological agents ruled out: Secondary | ICD-10-CM | POA: Diagnosis not present

## 2020-06-11 DIAGNOSIS — Z03818 Encounter for observation for suspected exposure to other biological agents ruled out: Secondary | ICD-10-CM | POA: Diagnosis not present

## 2020-06-19 DIAGNOSIS — Z03818 Encounter for observation for suspected exposure to other biological agents ruled out: Secondary | ICD-10-CM | POA: Diagnosis not present

## 2020-06-26 DIAGNOSIS — Z03818 Encounter for observation for suspected exposure to other biological agents ruled out: Secondary | ICD-10-CM | POA: Diagnosis not present

## 2020-07-03 DIAGNOSIS — Z03818 Encounter for observation for suspected exposure to other biological agents ruled out: Secondary | ICD-10-CM | POA: Diagnosis not present

## 2020-07-04 DIAGNOSIS — H8113 Benign paroxysmal vertigo, bilateral: Secondary | ICD-10-CM | POA: Diagnosis not present

## 2020-07-04 DIAGNOSIS — R600 Localized edema: Secondary | ICD-10-CM | POA: Diagnosis not present

## 2020-07-04 DIAGNOSIS — H8110 Benign paroxysmal vertigo, unspecified ear: Secondary | ICD-10-CM | POA: Diagnosis not present

## 2020-07-04 DIAGNOSIS — Z96651 Presence of right artificial knee joint: Secondary | ICD-10-CM | POA: Diagnosis not present

## 2020-07-04 DIAGNOSIS — R7301 Impaired fasting glucose: Secondary | ICD-10-CM | POA: Diagnosis not present

## 2020-07-04 DIAGNOSIS — K148 Other diseases of tongue: Secondary | ICD-10-CM | POA: Diagnosis not present

## 2020-07-10 DIAGNOSIS — G894 Chronic pain syndrome: Secondary | ICD-10-CM | POA: Diagnosis not present

## 2020-07-11 DIAGNOSIS — R7301 Impaired fasting glucose: Secondary | ICD-10-CM | POA: Diagnosis not present

## 2020-07-11 DIAGNOSIS — M4800 Spinal stenosis, site unspecified: Secondary | ICD-10-CM | POA: Diagnosis not present

## 2020-07-11 DIAGNOSIS — G4730 Sleep apnea, unspecified: Secondary | ICD-10-CM | POA: Diagnosis not present

## 2020-07-11 DIAGNOSIS — G894 Chronic pain syndrome: Secondary | ICD-10-CM | POA: Diagnosis not present

## 2020-07-11 DIAGNOSIS — Z96659 Presence of unspecified artificial knee joint: Secondary | ICD-10-CM | POA: Diagnosis not present

## 2020-07-11 DIAGNOSIS — R252 Cramp and spasm: Secondary | ICD-10-CM | POA: Diagnosis not present

## 2020-07-11 DIAGNOSIS — M25551 Pain in right hip: Secondary | ICD-10-CM | POA: Diagnosis not present

## 2020-07-11 DIAGNOSIS — R946 Abnormal results of thyroid function studies: Secondary | ICD-10-CM | POA: Diagnosis not present

## 2020-07-11 DIAGNOSIS — J309 Allergic rhinitis, unspecified: Secondary | ICD-10-CM | POA: Diagnosis not present

## 2020-07-11 DIAGNOSIS — D649 Anemia, unspecified: Secondary | ICD-10-CM | POA: Diagnosis not present

## 2020-07-17 DIAGNOSIS — M4325 Fusion of spine, thoracolumbar region: Secondary | ICD-10-CM | POA: Diagnosis not present

## 2020-07-17 DIAGNOSIS — M546 Pain in thoracic spine: Secondary | ICD-10-CM | POA: Diagnosis not present

## 2020-07-17 DIAGNOSIS — G894 Chronic pain syndrome: Secondary | ICD-10-CM | POA: Diagnosis not present

## 2020-07-17 DIAGNOSIS — M4010 Other secondary kyphosis, site unspecified: Secondary | ICD-10-CM | POA: Diagnosis not present

## 2020-07-21 DIAGNOSIS — G894 Chronic pain syndrome: Secondary | ICD-10-CM | POA: Diagnosis not present

## 2020-07-21 DIAGNOSIS — R601 Generalized edema: Secondary | ICD-10-CM | POA: Diagnosis not present

## 2020-07-21 DIAGNOSIS — D509 Iron deficiency anemia, unspecified: Secondary | ICD-10-CM | POA: Diagnosis not present

## 2020-07-24 DIAGNOSIS — G894 Chronic pain syndrome: Secondary | ICD-10-CM | POA: Diagnosis not present

## 2020-07-31 DIAGNOSIS — G894 Chronic pain syndrome: Secondary | ICD-10-CM | POA: Diagnosis not present

## 2020-08-01 ENCOUNTER — Ambulatory Visit (INDEPENDENT_AMBULATORY_CARE_PROVIDER_SITE_OTHER): Payer: No Typology Code available for payment source

## 2020-08-01 ENCOUNTER — Ambulatory Visit
Admission: EM | Admit: 2020-08-01 | Discharge: 2020-08-01 | Disposition: A | Discharge status: Home / Self Care | Payer: Medicare Other | Attending: Family Medicine | Admitting: Family Medicine

## 2020-08-01 DIAGNOSIS — M25531 Pain in right wrist: Secondary | ICD-10-CM

## 2020-08-01 DIAGNOSIS — M25431 Effusion, right wrist: Secondary | ICD-10-CM | POA: Diagnosis not present

## 2020-08-01 DIAGNOSIS — S63501A Unspecified sprain of right wrist, initial encounter: Secondary | ICD-10-CM | POA: Diagnosis not present

## 2020-08-01 MED ORDER — PREDNISONE 10 MG (21) PO TBPK: ORAL_TABLET | Freq: Every day | ORAL | 0 refills | Status: AC

## 2020-08-01 NOTE — Discharge Instructions (Addendum)
Take ibuprofen as needed.  Rest and elevate your wrist.  Apply ice packs 2-3 times a day for up to 20 minutes each.  Wear the brace as needed for comfort.   ° °Follow up with your primary care provider or an orthopedist if you symptoms continue or worsen;  Or if you develop new symptoms, such as numbness, tingling, or weakness.   ° °

## 2020-08-01 NOTE — ED Triage Notes (Signed)
Pt presents with right wrist pain and swelling, has had previous fracture but unaware of injury

## 2020-08-01 NOTE — ED Provider Notes (Signed)
[facility]   [id number] 08/01/20 Arrival Time: 1223  NW:GNFAO PAIN  SUBJECTIVE: History from: patient. Mark [name] is a 55 y.o. male complains of right wrist pain since yesterday. Denies a precipitating event or specific injury.  Describes the pain as intermittent and achy in character. Has tried OTC medications without relief. Takes subutex and nucynta. Symptoms are made worse with activity. Denies similar symptoms in the past. Denies fever, chills, erythema, ecchymosis, weakness, numbness and tingling, saddle paresthesias, loss of bowel or bladder function.      ROS: As per HPI.  All other pertinent ROS negative.     Past Medical History:  Diagnosis Date  . Anemia    low iron  . Anxiety   . Astigmatism of left eye   . Depression   . Fatigue   . GERD (gastroesophageal reflux disease)    heartburn occ - better since having gastric bypass  . Headache    migraines - none for years, sinus headaches at times  . Hyperlipemia   . Hypertension    no meds, 05/09/20- pt denies ever having had hypertension  . Knee pain   . Obesity, unspecified   . Osteoarthritis   . Pneumonia    as child   . Shoulder pain   . Sleep apnea   . Trigger finger (acquired)   . Unspecified sleep apnea    uses cpap   Past Surgical History:  Procedure Laterality Date  . Back surgery x3    . CARPAL TUNNEL RELEASE Bilateral   . FRACTURE SURGERY     all fingers expect l thumb  . GASTRIC BYPASS  2011  . JOINT REPLACEMENT    . KNEE ARTHROSCOPY Right   . KNEE ARTHROSCOPY Left 11/15/14  . SHOULDER SURGERY Left 2006  . SHOULDER SURGERY Right 2012 and 2017  . TOTAL HIP ARTHROPLASTY Right 08/05/2016   Procedure: TOTAL HIP ARTHROPLASTY ANTERIOR APPROACH;  Surgeon: Sheral Apley, MD;  Location: [facility] OR;  Service: Orthopedics;  Laterality: Right;  . TOTAL KNEE ARTHROPLASTY Left 07/27/2015   Procedure: LEFT TOTAL KNEE ARTHROPLASTY;  Surgeon: Frederico Hamman, MD;  Location: [facility] OR;  Service: Orthopedics;   Laterality: Left;  . TOTAL KNEE ARTHROPLASTY Right 12/19/2016   Procedure: TOTAL KNEE ARTHROPLASTY;  Surgeon: Frederico Hamman, MD;  Location: [facility] OR;  Service: Orthopedics;  Laterality: Right;   Allergies  Allergen Reactions  . Asa [Aspirin] Other (See Comments)    gastric bypass   . Ibuprofen Other (See Comments)    gastric bypass  . Penicillins Hives and Other (See Comments)    Has patient had a PCN reaction causing immediate rash, facial/tongue/throat swelling, SOB or lightheadedness with hypotension: Yes Has patient had a PCN reaction causing severe rash involving mucus membranes or skin necrosis: No Has patient had a PCN reaction that required hospitalization unknown "probably not" Has patient had a PCN reaction occurring within the last 10 years: No If all of the above answers are "NO", then may proceed with Cephalosporin use.  . Adhesive [Tape] Rash and Other (See Comments)    Please use paper tape, can use cloth tape  States plastic bandaids irritate  . Morphine And Related Nausea And Vomiting and Other (See Comments)    headaches   No current facility-administered medications on file prior to encounter.   Current Outpatient Medications on File Prior to Encounter  Medication Sig Dispense Refill  . acetaminophen (TYLENOL) 500 MG tablet Take 1,000 mg by mouth every 8 (eight) hours  as needed for moderate pain or headache.     . amphetamine-dextroamphetamine (ADDERALL) 20 MG tablet Take 20 mg by mouth in the morning, at noon, in the evening, and at bedtime.    . buprenorphine (SUBUTEX) 8 MG SUBL SL tablet Place 8 mg under the tongue in the morning, at noon, in the evening, and at bedtime.    . ferrous sulfate 325 (65 FE) MG tablet Take 325 mg by mouth 2 (two) times daily with a meal.    . lamoTRIgine (LAMICTAL) 100 MG tablet Take 100 mg by mouth at bedtime.     Marland Kitchen levocetirizine (XYZAL) 5 MG tablet Take 5 mg by mouth every evening.    . magnesium oxide (MAG-OX) 400 MG tablet Take  400 mg by mouth daily. (Patient not taking: Reported on 05/09/2020)    . meclizine (ANTIVERT) 25 MG tablet     . methocarbamol (ROBAXIN) 750 MG tablet Take 750 mg by mouth in the morning, at noon, in the evening, and at bedtime.    Raylene Miyamoto ER 200 MG TB12 Take 200 mg by mouth in the morning and at bedtime.    Marland Kitchen oxymetazoline (AFRIN) 0.05 % nasal spray Place 1 spray into both nostrils 4 (four) times daily as needed (nasal congestion).     . potassium chloride (KLOR-CON) 10 MEQ tablet Take 10 mEq by mouth daily.    Marland Kitchen REXULTI 4 MG TABS Take 4 mg by mouth daily.    . sertraline (ZOLOFT) 100 MG tablet Take 100 mg by mouth daily.    . Tapentadol HCl (NUCYNTA) 100 MG TABS Take 100 mg by mouth in the morning, at noon, in the evening, and at bedtime.    Marland Kitchen testosterone cypionate (DEPOTESTOSTERONE CYPIONATE) 200 MG/ML injection Inject 200 mg into the muscle every 14 (fourteen) days.     Social History   Socioeconomic History  . Marital status: Married    Spouse name: Not on file  . Number of children: 3  . Years of education: HS  . Highest education level: Not on file  Occupational History  . Occupation: [employer]  Tobacco Use  . Smoking status: Never Smoker  . Smokeless tobacco: Former Neurosurgeon    Types: Chew  Substance and Sexual Activity  . Alcohol use: Yes    Alcohol/week: 0.0 standard drinks    Comment: One every 1-2 months  . Drug use: No  . Sexual activity: Yes    Birth control/protection: None  Other Topics Concern  . Not on file  Social History Narrative   Drinks 10 cups of coffee    Social Determinants of Health   Financial Resource Strain: Not on file  Food Insecurity: Not on file  Transportation Needs: Not on file  Physical Activity: Not on file  Stress: Not on file  Social Connections: Not on file  Intimate Partner Violence: Not on file   Family History  Problem Relation Age of Onset  . Heart failure Mother   . Diabetes Mother   . COPD Mother   . Cancer Father    . Arthritis Father   . Colon cancer Neg Hx     OBJECTIVE:  Vitals:   08/01/20 1306  BP: (!) 147/84  Pulse: 90  Resp: 18  Temp: 99.2 F (37.3 C)  SpO2: 95%    General appearance: ALERT; in no acute distress.  Head: NCAT Lungs: Normal respiratory effort CV: pulses 2+ bilaterally. Cap refill < 2 seconds Musculoskeletal:  Inspection: Skin warm, dry,  clear and intact No erythema, effusion to right wrist Palpation: right wrist tender to palpation ROM: Limited ROM active and passive to right wrist Skin: warm and dry Neurologic: Ambulates without difficulty; Sensation intact about the upper/ lower extremities Psychological: alert and cooperative; normal mood and affect  DIAGNOSTIC STUDIES:  DG Wrist Complete Right  Result Date: 08/01/2020 CLINICAL DATA:  Right wrist pain and swelling EXAM: RIGHT WRIST - COMPLETE 3+ VIEW COMPARISON:  None. Findings are correlated with prior radiographs of the left hand of 12/17/2015 FINDINGS: There is normal alignment of the right wrist there is normal overall bone mineral density. There is severe degenerative arthritis of the first, second, third, and fourth MCP joints. Additionally, there are extensive periarticular erosions and subchondral cysts within the metacarpal heads, bases of the proximal phalanges, and within the carpus, particularly within the capitate and scaphoid. Mild degenerative arthritis of the radiocarpal joint is identified with small osteophytes. Review of the prior radiographs of the left hand demonstrate similar erosions and degenerative arthritis. The findings are suspicious for crystalline arthropathy such as CPPD arthritis or hemochromatosis associated arthritis. IMPRESSION: Advanced degenerative changes and extensive periarticular erosions and subchondral cysts, as described above. The findings appear more severe when compared to the prior examination of the left hand, though this may relate to interval progression of disease.  Comparison with the contralateral wrist and hand is recommended. Correlation for crystalline arthropathy, including iron studies, is recommended. Electronically Signed   By: Helyn Numbers MD   On: 08/01/2020 13:30     ASSESSMENT & PLAN:  1. Sprain of right wrist, initial encounter   2. Right wrist pain     Meds ordered this encounter  Medications  . predniSONE (STERAPRED UNI-PAK 21 TAB) 10 MG (21) TBPK tablet    Sig: Take by mouth daily for 6 days. Take 6 tablets on day 1, 5 tablets on day 2, 4 tablets on day 3, 3 tablets on day 4, 2 tablets on day 5, 1 tablet on day 6    Dispense:  21 tablet    Refill:  0    Order Specific Question:   Supervising Provider    Answer:   Merrilee Jansky [[id number]]   Prescribed steroids Brace applied in office Continue conservative management of rest, ice, and gentle stretches Take tylenol as needed for pain relief  Follow up with orhopedics Return or go to the ER if you have any new or worsening symptoms (fever, chills, chest pain, abdominal pain, changes in bowel or bladder habits, pain radiating into lower legs)   Reviewed expectations re: course of current medical issues. Questions answered. Outlined signs and symptoms indicating need for more acute intervention. Patient verbalized understanding. After Visit Summary given.       Moshe Cipro, NP 08/01/20 1502

## 2020-08-07 DIAGNOSIS — G894 Chronic pain syndrome: Secondary | ICD-10-CM | POA: Diagnosis not present

## 2020-08-14 DIAGNOSIS — G894 Chronic pain syndrome: Secondary | ICD-10-CM | POA: Diagnosis not present

## 2020-08-20 DIAGNOSIS — M4800 Spinal stenosis, site unspecified: Secondary | ICD-10-CM | POA: Diagnosis not present

## 2020-08-20 DIAGNOSIS — G4730 Sleep apnea, unspecified: Secondary | ICD-10-CM | POA: Diagnosis not present

## 2020-09-11 DIAGNOSIS — M12831 Other specific arthropathies, not elsewhere classified, right wrist: Secondary | ICD-10-CM | POA: Diagnosis not present

## 2020-09-11 DIAGNOSIS — J0100 Acute maxillary sinusitis, unspecified: Secondary | ICD-10-CM | POA: Diagnosis not present

## 2020-09-19 DIAGNOSIS — E1165 Type 2 diabetes mellitus with hyperglycemia: Secondary | ICD-10-CM | POA: Diagnosis not present

## 2020-09-19 DIAGNOSIS — I10 Essential (primary) hypertension: Secondary | ICD-10-CM | POA: Diagnosis not present

## 2020-10-21 DIAGNOSIS — J309 Allergic rhinitis, unspecified: Secondary | ICD-10-CM | POA: Diagnosis not present

## 2020-10-21 DIAGNOSIS — E1165 Type 2 diabetes mellitus with hyperglycemia: Secondary | ICD-10-CM | POA: Diagnosis not present

## 2020-10-23 DIAGNOSIS — G894 Chronic pain syndrome: Secondary | ICD-10-CM | POA: Diagnosis not present

## 2020-10-30 DIAGNOSIS — G894 Chronic pain syndrome: Secondary | ICD-10-CM | POA: Diagnosis not present

## 2020-11-06 DIAGNOSIS — G894 Chronic pain syndrome: Secondary | ICD-10-CM | POA: Diagnosis not present

## 2020-11-20 DIAGNOSIS — E1165 Type 2 diabetes mellitus with hyperglycemia: Secondary | ICD-10-CM | POA: Diagnosis not present

## 2020-11-20 DIAGNOSIS — J309 Allergic rhinitis, unspecified: Secondary | ICD-10-CM | POA: Diagnosis not present

## 2020-12-01 DIAGNOSIS — G894 Chronic pain syndrome: Secondary | ICD-10-CM | POA: Diagnosis not present

## 2020-12-11 DIAGNOSIS — E782 Mixed hyperlipidemia: Secondary | ICD-10-CM | POA: Diagnosis not present

## 2020-12-11 DIAGNOSIS — R7989 Other specified abnormal findings of blood chemistry: Secondary | ICD-10-CM | POA: Diagnosis not present

## 2020-12-11 DIAGNOSIS — I10 Essential (primary) hypertension: Secondary | ICD-10-CM | POA: Diagnosis not present

## 2020-12-16 DIAGNOSIS — G894 Chronic pain syndrome: Secondary | ICD-10-CM | POA: Diagnosis not present

## 2020-12-20 DIAGNOSIS — J309 Allergic rhinitis, unspecified: Secondary | ICD-10-CM | POA: Diagnosis not present

## 2020-12-20 DIAGNOSIS — E1165 Type 2 diabetes mellitus with hyperglycemia: Secondary | ICD-10-CM | POA: Diagnosis not present

## 2020-12-25 DIAGNOSIS — G894 Chronic pain syndrome: Secondary | ICD-10-CM | POA: Diagnosis not present

## 2021-01-03 DIAGNOSIS — G894 Chronic pain syndrome: Secondary | ICD-10-CM | POA: Diagnosis not present

## 2021-01-04 DIAGNOSIS — G894 Chronic pain syndrome: Secondary | ICD-10-CM | POA: Diagnosis not present

## 2021-01-10 DIAGNOSIS — G894 Chronic pain syndrome: Secondary | ICD-10-CM | POA: Diagnosis not present

## 2021-01-15 DIAGNOSIS — E782 Mixed hyperlipidemia: Secondary | ICD-10-CM | POA: Diagnosis not present

## 2021-01-22 DIAGNOSIS — G894 Chronic pain syndrome: Secondary | ICD-10-CM | POA: Diagnosis not present

## 2021-01-22 DIAGNOSIS — E782 Mixed hyperlipidemia: Secondary | ICD-10-CM | POA: Diagnosis not present

## 2021-01-22 DIAGNOSIS — R7301 Impaired fasting glucose: Secondary | ICD-10-CM | POA: Diagnosis not present

## 2021-01-22 DIAGNOSIS — G4733 Obstructive sleep apnea (adult) (pediatric): Secondary | ICD-10-CM | POA: Diagnosis not present

## 2021-01-22 DIAGNOSIS — R946 Abnormal results of thyroid function studies: Secondary | ICD-10-CM | POA: Diagnosis not present

## 2021-01-22 DIAGNOSIS — M9959 Intervertebral disc stenosis of neural canal of abdomen and other regions: Secondary | ICD-10-CM | POA: Diagnosis not present

## 2021-01-22 DIAGNOSIS — D649 Anemia, unspecified: Secondary | ICD-10-CM | POA: Diagnosis not present

## 2021-02-20 DIAGNOSIS — J309 Allergic rhinitis, unspecified: Secondary | ICD-10-CM | POA: Diagnosis not present

## 2021-02-20 DIAGNOSIS — E1165 Type 2 diabetes mellitus with hyperglycemia: Secondary | ICD-10-CM | POA: Diagnosis not present

## 2021-02-26 DIAGNOSIS — R7989 Other specified abnormal findings of blood chemistry: Secondary | ICD-10-CM | POA: Diagnosis not present

## 2021-04-03 DIAGNOSIS — M4010 Other secondary kyphosis, site unspecified: Secondary | ICD-10-CM | POA: Diagnosis not present

## 2021-04-03 DIAGNOSIS — T84216A Breakdown (mechanical) of internal fixation device of vertebrae, initial encounter: Secondary | ICD-10-CM | POA: Diagnosis not present

## 2021-04-03 DIAGNOSIS — M4325 Fusion of spine, thoracolumbar region: Secondary | ICD-10-CM | POA: Diagnosis not present

## 2021-04-03 DIAGNOSIS — M546 Pain in thoracic spine: Secondary | ICD-10-CM | POA: Diagnosis not present

## 2021-04-09 DIAGNOSIS — R7989 Other specified abnormal findings of blood chemistry: Secondary | ICD-10-CM | POA: Diagnosis not present

## 2021-04-15 DIAGNOSIS — M4306 Spondylolysis, lumbar region: Secondary | ICD-10-CM | POA: Diagnosis not present

## 2021-04-15 DIAGNOSIS — Z981 Arthrodesis status: Secondary | ICD-10-CM | POA: Diagnosis not present

## 2021-04-22 DIAGNOSIS — E1165 Type 2 diabetes mellitus with hyperglycemia: Secondary | ICD-10-CM | POA: Diagnosis not present

## 2021-04-22 DIAGNOSIS — J309 Allergic rhinitis, unspecified: Secondary | ICD-10-CM | POA: Diagnosis not present

## 2021-04-29 DIAGNOSIS — R946 Abnormal results of thyroid function studies: Secondary | ICD-10-CM | POA: Diagnosis not present

## 2021-04-29 DIAGNOSIS — R7301 Impaired fasting glucose: Secondary | ICD-10-CM | POA: Diagnosis not present

## 2021-04-29 DIAGNOSIS — E782 Mixed hyperlipidemia: Secondary | ICD-10-CM | POA: Diagnosis not present

## 2021-04-29 DIAGNOSIS — R7989 Other specified abnormal findings of blood chemistry: Secondary | ICD-10-CM | POA: Diagnosis not present

## 2021-05-03 DIAGNOSIS — Z23 Encounter for immunization: Secondary | ICD-10-CM | POA: Diagnosis not present

## 2021-05-03 DIAGNOSIS — Z0001 Encounter for general adult medical examination with abnormal findings: Secondary | ICD-10-CM | POA: Diagnosis not present

## 2021-05-03 DIAGNOSIS — D649 Anemia, unspecified: Secondary | ICD-10-CM | POA: Diagnosis not present

## 2021-05-03 DIAGNOSIS — G4733 Obstructive sleep apnea (adult) (pediatric): Secondary | ICD-10-CM | POA: Diagnosis not present

## 2021-05-03 DIAGNOSIS — R946 Abnormal results of thyroid function studies: Secondary | ICD-10-CM | POA: Diagnosis not present

## 2021-05-03 DIAGNOSIS — M9959 Intervertebral disc stenosis of neural canal of abdomen and other regions: Secondary | ICD-10-CM | POA: Diagnosis not present

## 2021-05-03 DIAGNOSIS — G894 Chronic pain syndrome: Secondary | ICD-10-CM | POA: Diagnosis not present

## 2021-05-03 DIAGNOSIS — R7301 Impaired fasting glucose: Secondary | ICD-10-CM | POA: Diagnosis not present

## 2021-05-03 DIAGNOSIS — E782 Mixed hyperlipidemia: Secondary | ICD-10-CM | POA: Diagnosis not present

## 2021-05-22 DIAGNOSIS — J309 Allergic rhinitis, unspecified: Secondary | ICD-10-CM | POA: Diagnosis not present

## 2021-05-22 DIAGNOSIS — E1165 Type 2 diabetes mellitus with hyperglycemia: Secondary | ICD-10-CM | POA: Diagnosis not present

## 2021-05-24 DIAGNOSIS — R7989 Other specified abnormal findings of blood chemistry: Secondary | ICD-10-CM | POA: Diagnosis not present

## 2021-05-29 DIAGNOSIS — M4325 Fusion of spine, thoracolumbar region: Secondary | ICD-10-CM | POA: Diagnosis not present

## 2021-05-29 DIAGNOSIS — M546 Pain in thoracic spine: Secondary | ICD-10-CM | POA: Diagnosis not present

## 2021-05-29 DIAGNOSIS — M4010 Other secondary kyphosis, site unspecified: Secondary | ICD-10-CM | POA: Diagnosis not present

## 2021-10-23 DIAGNOSIS — R7301 Impaired fasting glucose: Secondary | ICD-10-CM | POA: Diagnosis not present

## 2021-10-23 DIAGNOSIS — R946 Abnormal results of thyroid function studies: Secondary | ICD-10-CM | POA: Diagnosis not present

## 2021-10-23 DIAGNOSIS — E782 Mixed hyperlipidemia: Secondary | ICD-10-CM | POA: Diagnosis not present

## 2021-10-30 DIAGNOSIS — M9959 Intervertebral disc stenosis of neural canal of abdomen and other regions: Secondary | ICD-10-CM | POA: Diagnosis not present

## 2021-10-30 DIAGNOSIS — E782 Mixed hyperlipidemia: Secondary | ICD-10-CM | POA: Diagnosis not present

## 2021-10-30 DIAGNOSIS — R7301 Impaired fasting glucose: Secondary | ICD-10-CM | POA: Diagnosis not present

## 2021-10-30 DIAGNOSIS — G4733 Obstructive sleep apnea (adult) (pediatric): Secondary | ICD-10-CM | POA: Diagnosis not present

## 2021-10-30 DIAGNOSIS — R946 Abnormal results of thyroid function studies: Secondary | ICD-10-CM | POA: Diagnosis not present

## 2021-10-30 DIAGNOSIS — D649 Anemia, unspecified: Secondary | ICD-10-CM | POA: Diagnosis not present

## 2021-10-30 DIAGNOSIS — G894 Chronic pain syndrome: Secondary | ICD-10-CM | POA: Diagnosis not present

## 2021-11-08 DIAGNOSIS — M4325 Fusion of spine, thoracolumbar region: Secondary | ICD-10-CM | POA: Diagnosis not present

## 2021-11-08 DIAGNOSIS — M546 Pain in thoracic spine: Secondary | ICD-10-CM | POA: Diagnosis not present

## 2021-11-08 DIAGNOSIS — T84216A Breakdown (mechanical) of internal fixation device of vertebrae, initial encounter: Secondary | ICD-10-CM | POA: Diagnosis not present

## 2021-11-08 DIAGNOSIS — M960 Pseudarthrosis after fusion or arthrodesis: Secondary | ICD-10-CM | POA: Diagnosis not present

## 2021-11-20 DIAGNOSIS — I10 Essential (primary) hypertension: Secondary | ICD-10-CM | POA: Diagnosis not present

## 2021-11-20 DIAGNOSIS — E782 Mixed hyperlipidemia: Secondary | ICD-10-CM | POA: Diagnosis not present

## 2021-11-22 DIAGNOSIS — Z01810 Encounter for preprocedural cardiovascular examination: Secondary | ICD-10-CM | POA: Diagnosis not present

## 2021-11-22 DIAGNOSIS — Z01812 Encounter for preprocedural laboratory examination: Secondary | ICD-10-CM | POA: Diagnosis not present

## 2021-11-22 DIAGNOSIS — M960 Pseudarthrosis after fusion or arthrodesis: Secondary | ICD-10-CM | POA: Diagnosis not present

## 2021-11-22 DIAGNOSIS — Z9889 Other specified postprocedural states: Secondary | ICD-10-CM | POA: Diagnosis not present

## 2021-12-02 DIAGNOSIS — M4726 Other spondylosis with radiculopathy, lumbar region: Secondary | ICD-10-CM | POA: Diagnosis not present

## 2021-12-02 DIAGNOSIS — T84216A Breakdown (mechanical) of internal fixation device of vertebrae, initial encounter: Secondary | ICD-10-CM | POA: Diagnosis not present

## 2021-12-02 DIAGNOSIS — M4306 Spondylolysis, lumbar region: Secondary | ICD-10-CM | POA: Diagnosis not present

## 2021-12-02 DIAGNOSIS — M4326 Fusion of spine, lumbar region: Secondary | ICD-10-CM | POA: Diagnosis not present

## 2021-12-02 DIAGNOSIS — Z9884 Bariatric surgery status: Secondary | ICD-10-CM | POA: Diagnosis not present

## 2021-12-02 DIAGNOSIS — M48062 Spinal stenosis, lumbar region with neurogenic claudication: Secondary | ICD-10-CM | POA: Diagnosis not present

## 2021-12-02 DIAGNOSIS — M960 Pseudarthrosis after fusion or arthrodesis: Secondary | ICD-10-CM | POA: Diagnosis not present

## 2021-12-02 DIAGNOSIS — Z981 Arthrodesis status: Secondary | ICD-10-CM | POA: Diagnosis not present

## 2021-12-02 DIAGNOSIS — M4316 Spondylolisthesis, lumbar region: Secondary | ICD-10-CM | POA: Diagnosis not present

## 2021-12-02 DIAGNOSIS — Z96641 Presence of right artificial hip joint: Secondary | ICD-10-CM | POA: Diagnosis not present

## 2021-12-02 DIAGNOSIS — M4807 Spinal stenosis, lumbosacral region: Secondary | ICD-10-CM | POA: Diagnosis not present

## 2021-12-02 DIAGNOSIS — M4716 Other spondylosis with myelopathy, lumbar region: Secondary | ICD-10-CM | POA: Diagnosis not present

## 2021-12-02 DIAGNOSIS — T84296A Other mechanical complication of internal fixation device of vertebrae, initial encounter: Secondary | ICD-10-CM | POA: Diagnosis not present

## 2021-12-02 DIAGNOSIS — Z885 Allergy status to narcotic agent status: Secondary | ICD-10-CM | POA: Diagnosis not present

## 2021-12-02 DIAGNOSIS — M4010 Other secondary kyphosis, site unspecified: Secondary | ICD-10-CM | POA: Diagnosis not present

## 2021-12-02 DIAGNOSIS — Z880 Allergy status to penicillin: Secondary | ICD-10-CM | POA: Diagnosis not present

## 2021-12-02 DIAGNOSIS — M5136 Other intervertebral disc degeneration, lumbar region: Secondary | ICD-10-CM | POA: Diagnosis not present

## 2022-01-09 DIAGNOSIS — M549 Dorsalgia, unspecified: Secondary | ICD-10-CM | POA: Diagnosis not present

## 2022-02-03 DIAGNOSIS — M9959 Intervertebral disc stenosis of neural canal of abdomen and other regions: Secondary | ICD-10-CM | POA: Diagnosis not present

## 2022-02-03 DIAGNOSIS — L600 Ingrowing nail: Secondary | ICD-10-CM | POA: Diagnosis not present

## 2022-02-07 ENCOUNTER — Ambulatory Visit: Payer: Medicare Other | Admitting: Podiatry

## 2022-02-07 IMAGING — CT CT ANKLE*L* W/O CM
1 series · 12 of 14 positions shown, 15 images · non-contrast
Comparison: None.

CLINICAL DATA: Left ankle pain and swelling

EXAM:
CT OF THE LEFT ANKLE WITHOUT CONTRAST
TECHNIQUE: Multidetector CT imaging of the left ankle was performed according
to the standard protocol. Multiplanar CT image reconstructions were
also generated.

[Series 4: soft tissue lower extremity · axial · 0.33mm/px · z∈[-332,-178]mm · 12 of 93 slices shown, 15 images]
[im 8/93  soft-tissue]
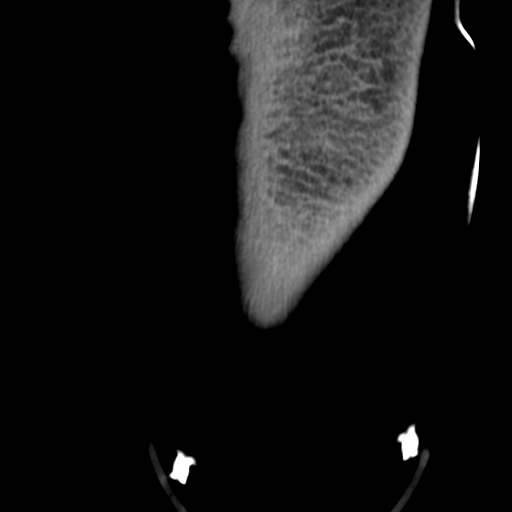
[im 8/93  bone]
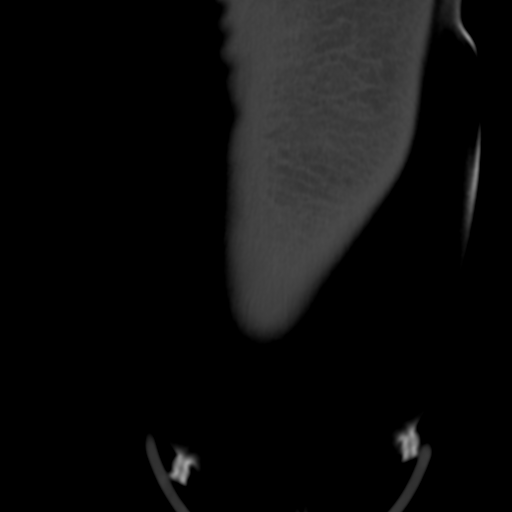
[im 15/93  bone]
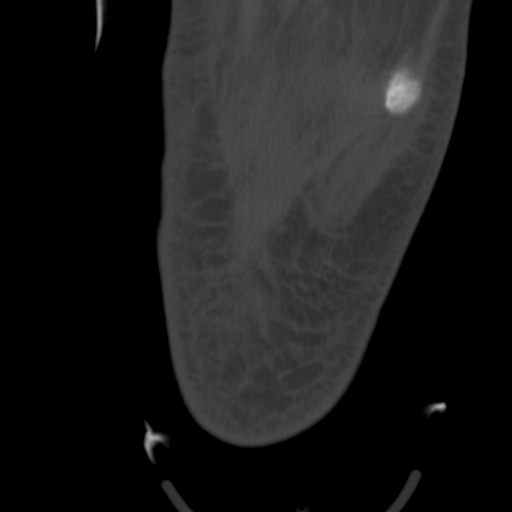
[im 22/93  bone]
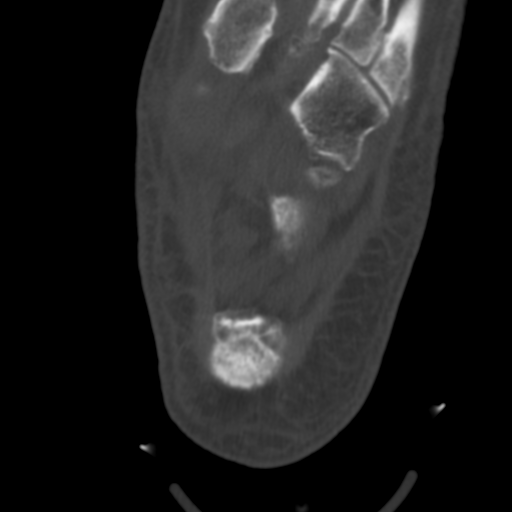
[im 29/93  bone]
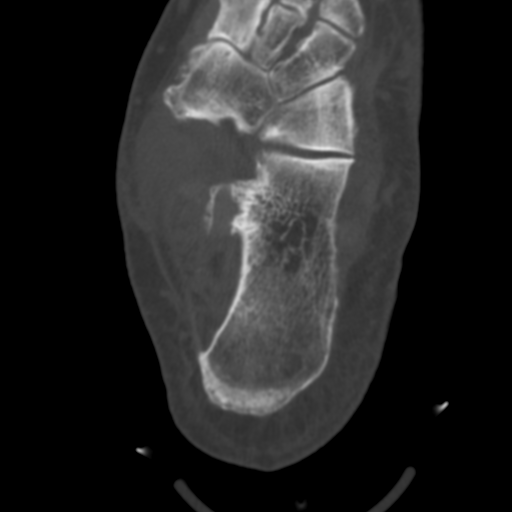
[im 36/93  soft-tissue]
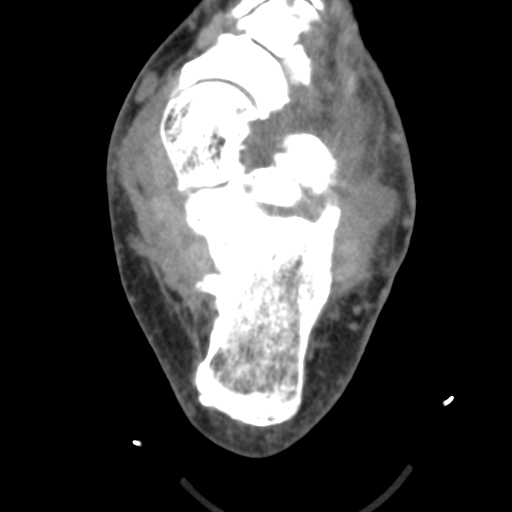
[im 36/93  bone]
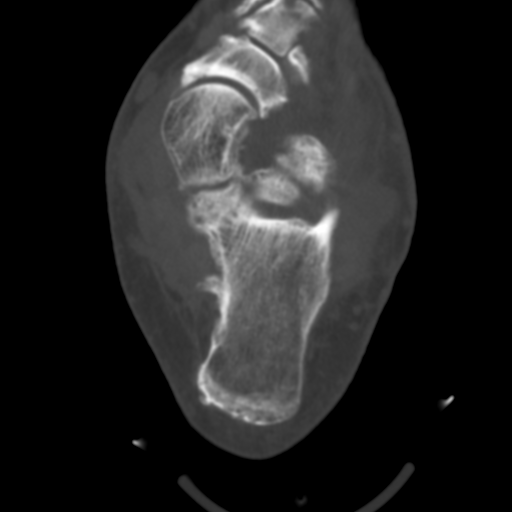
[im 43/93  bone]
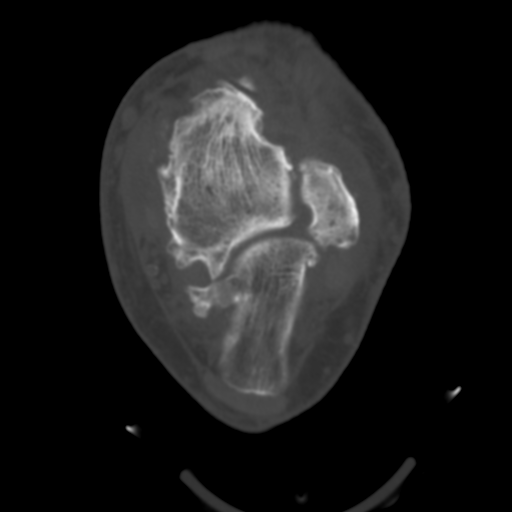
[im 50/93  bone]
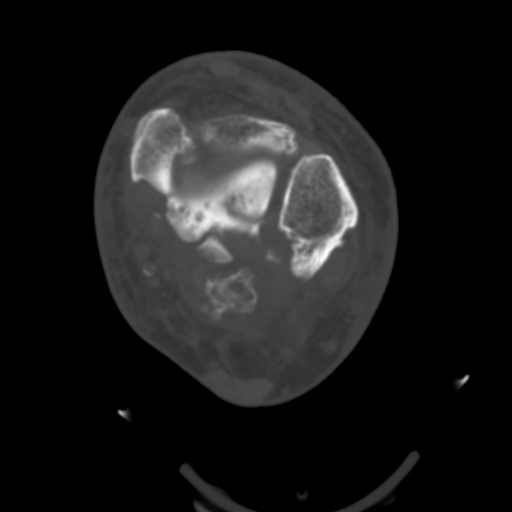
[im 57/93  bone]
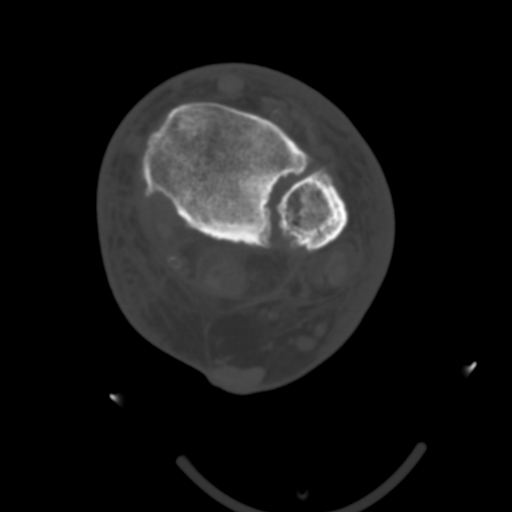
[im 64/93  soft-tissue]
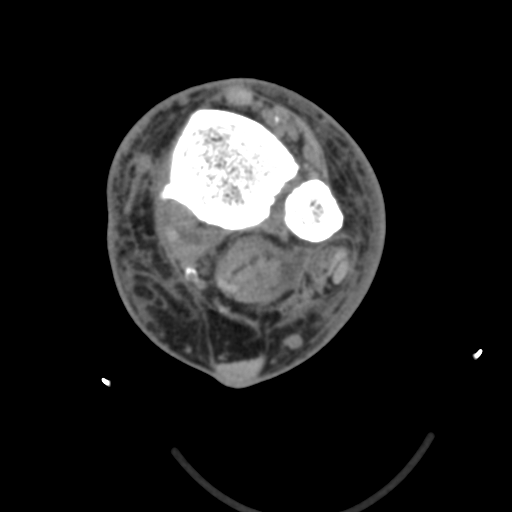
[im 64/93  bone]
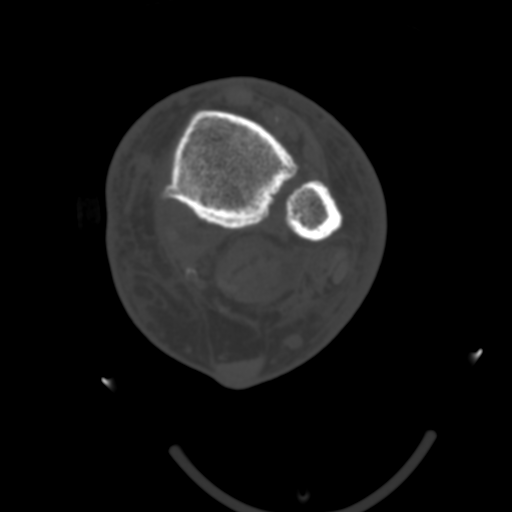
[im 71/93  bone]
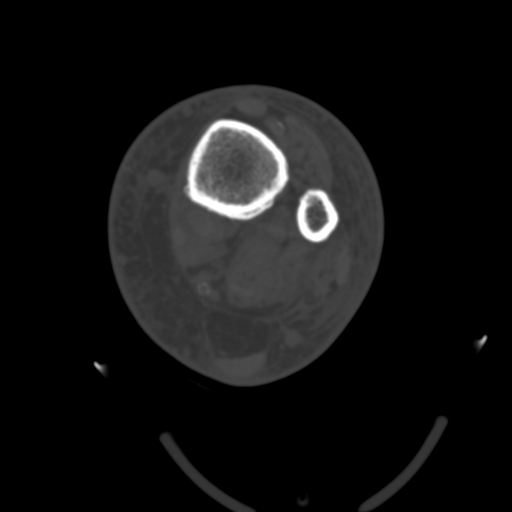
[im 78/93  bone]
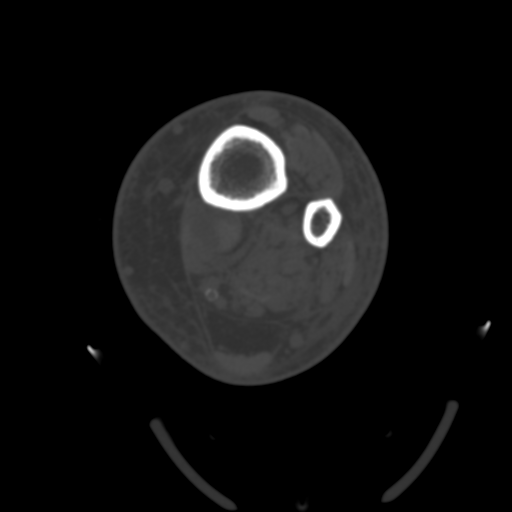
[im 85/93  bone]
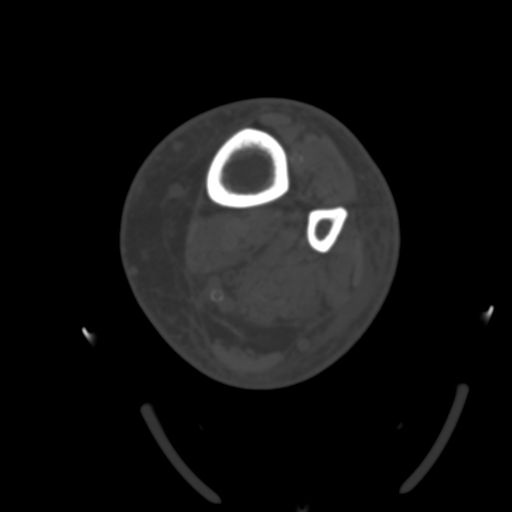

[12 of 14 positions shown; findings below may reference images not displayed]

FINDINGS: Bones/Joint/Cartilage

No acute fracture. No dislocation. Pes planovalgus alignment.
Moderate to severe osteoarthritis of the tibiotalar joint with
complete joint space loss at the posterior aspect of the joint with
associated subchondral sclerosis and subchondral cystic changes.
There are anterolateral osteophytosis. Prominent spurring within the
medial and lateral gutters with subfibular impingement.

Moderate to severe osteoarthritis involving the anterior and middle
subtalar joints. Large 2.6 x 2.2 cm ossified density posterior to
the posterior subtalar joint which may reflect a large os trigonum
or fragmented osteophyte. Subtalar joint effusion is seen.
Bidirectional calcaneal enthesophytes.

There is moderate arthropathy throughout the midfoot with dorsal
hypertrophy. No discrete erosion.

Ligaments

Suboptimally assessed by CT.

Muscles and Tendons

Myotendinous structures appear grossly intact. The posterior
tibialis tendon does appear thickened with a small amount of
tenosynovial fluid suggesting tendinosis.

Soft tissues

Mild diffuse soft tissue edema. No soft tissue fluid collection.
Vascular calcifications are noted.
IMPRESSION: Moderate to severe osteoarthritis of the left ankle and hindfoot, as
above.

## 2022-02-17 ENCOUNTER — Ambulatory Visit: Payer: Medicare Other | Admitting: Podiatry

## 2022-02-25 ENCOUNTER — Ambulatory Visit: Payer: Medicare Other | Admitting: Podiatry

## 2022-02-25 DIAGNOSIS — Z79899 Other long term (current) drug therapy: Secondary | ICD-10-CM

## 2022-02-25 DIAGNOSIS — B351 Tinea unguium: Secondary | ICD-10-CM

## 2022-02-25 NOTE — Patient Instructions (Signed)
Terbinafine Tablets What is this medication? TERBINAFINE (TER bin a feen) treats fungal infections of the nails. It belongs to a group of medications called antifungals. It will not treat infections caused by bacteria or viruses. This medicine may be used for other purposes; ask your health care provider or pharmacist if you have questions. COMMON BRAND NAME(S): Lamisil, Terbinex What should I tell my care team before I take this medication? They need to know if you have any of these conditions: Liver disease An unusual or allergic reaction to terbinafine, other medications, foods, dyes, or preservatives Pregnant or trying to get pregnant Breast-feeding How should I use this medication? Take this medication by mouth with water. Take it as directed on the prescription label at the same time every day. You can take it with or without food. If it upsets your stomach, take it with food. Keep taking it unless your care team tells you to stop. A special MedGuide will be given to you by the pharmacist with each prescription and refill. Be sure to read this information carefully each time. Talk to your care team regarding the use of this medication in children. Special care may be needed. Overdosage: If you think you have taken too much of this medicine contact a poison control center or emergency room at once. NOTE: This medicine is only for you. Do not share this medicine with others. What if I miss a dose? If you miss a dose, take it as soon as you can unless it is more than 4 hours late. If it is more than 4 hours late, skip the missed dose. Take the next dose at the normal time. What may interact with this medication? Do not take this medication with any of the following: Pimozide Thioridazine This medication may also interact with the following: Beta blockers Caffeine Certain medications for mental health conditions Cimetidine Cyclosporine Medications for fungal infections like fluconazole  and ketoconazole Medications for irregular heartbeat like amiodarone, flecainide and propafenone Rifampin Warfarin This list may not describe all possible interactions. Give your health care provider a list of all the medicines, herbs, non-prescription drugs, or dietary supplements you use. Also tell them if you smoke, drink alcohol, or use illegal drugs. Some items may interact with your medicine. What should I watch for while using this medication? Visit your care team for regular checks on your progress. You may need blood work while you are taking this medication. It may be some time before you see the benefit from this medication. This medication may cause serious skin reactions. They can happen weeks to months after starting the medication. Contact your care team right away if you notice fevers or flu-like symptoms with a rash. The rash may be red or purple and then turn into blisters or peeling of the skin. Or, you might notice a red rash with swelling of the face, lips or lymph nodes in your neck or under your arms. This medication can make you more sensitive to the sun. Keep out of the sun, If you cannot avoid being in the sun, wear protective clothing and sunscreen. Do not use sun lamps or tanning beds/booths. What side effects may I notice from receiving this medication? Side effects that you should report to your care team as soon as possible: Allergic reactions--skin rash, itching, hives, swelling of the face, lips, tongue, or throat Change in sense of smell Change in taste Infection--fever, chills, cough, or sore throat Liver injury--right upper belly pain, loss of appetite, nausea,   light-colored stool, dark yellow or brown urine, yellowing skin or eyes, unusual weakness or fatigue Low red blood cell level--unusual weakness or fatigue, dizziness, headache, trouble breathing Lupus-like syndrome--joint pain, swelling, or stiffness, butterfly-shaped rash on the face, rashes that get worse  in the sun, fever, unusual weakness or fatigue Rash, fever, and swollen lymph nodes Redness, blistering, peeling, or loosening of the skin, including inside the mouth Unusual bruising or bleeding Worsening mood, feelings of depression Side effects that usually do not require medical attention (report to your care team if they continue or are bothersome): Diarrhea Gas Headache Nausea Stomach pain Upset stomach This list may not describe all possible side effects. Call your doctor for medical advice about side effects. You may report side effects to FDA at 1-800-FDA-1088. Where should I keep my medication? Keep out of the reach of children and pets. Store between 20 and 25 degrees C (68 and 77 degrees F). Protect from light. Get rid of any unused medication after the expiration date. To get rid of medications that are no longer needed or have expired: Take the medication to a medication take-back program. Check with your pharmacy or law enforcement to find a location. If you cannot return the medication, check the label or package insert to see if the medication should be thrown out in the garbage or flushed down the toilet. If you are not sure, ask your care team. If it is safe to put it in the trash, take the medication out of the container. Mix the medication with cat litter, dirt, coffee grounds, or other unwanted substance. Seal the mixture in a bag or container. Put it in the trash. NOTE: This sheet is a summary. It may not cover all possible information. If you have questions about this medicine, talk to your doctor, pharmacist, or health care provider.  2023 Elsevier/Gold Standard (2021-01-01 00:00:00)  

## 2022-02-25 NOTE — Progress Notes (Signed)
Subjective:   Patient ID: Mark [name], male   DOB: 56 y.o.   MRN: [id number]   HPI Chief Complaint  Patient presents with   Ingrown Toenail    Ingrowing nail    56 y.o. male presents with the above complaints. He has been dealing with his toenails for a while.  His nails become discolored.  He saw his PCP and referred him here. No drainage, swelling, redness. He states it is a little tender, but not bad. No treatment.   Review of Systems  All other systems reviewed and are negative.  Past Medical History:  Diagnosis Date   Anemia    low iron   Anxiety    Astigmatism of left eye    Depression    Fatigue    GERD (gastroesophageal reflux disease)    heartburn occ - better since having gastric bypass   Headache    migraines - none for years, sinus headaches at times   Hyperlipemia    Hypertension    no meds, 05/09/20- pt denies ever having had hypertension   Knee pain    Obesity, unspecified    Osteoarthritis    Pneumonia    as child    Shoulder pain    Sleep apnea    Trigger finger (acquired)    Unspecified sleep apnea    uses cpap    Past Surgical History:  Procedure Laterality Date   Back surgery x3     CARPAL TUNNEL RELEASE Bilateral    FRACTURE SURGERY     all fingers expect l thumb   GASTRIC BYPASS  2011   JOINT REPLACEMENT     KNEE ARTHROSCOPY Right    KNEE ARTHROSCOPY Left 11/15/14   SHOULDER SURGERY Left 2006   SHOULDER SURGERY Right 2012 and 2017   TOTAL HIP ARTHROPLASTY Right 08/05/2016   Procedure: TOTAL HIP ARTHROPLASTY ANTERIOR APPROACH;  Surgeon: Sheral Apley, MD;  Location: [facility] OR;  Service: Orthopedics;  Laterality: Right;   TOTAL KNEE ARTHROPLASTY Left 07/27/2015   Procedure: LEFT TOTAL KNEE ARTHROPLASTY;  Surgeon: Frederico Hamman, MD;  Location[facility] OR;  Service: Orthopedics;  Laterality: Left;   TOTAL KNEE ARTHROPLASTY Right 12/19/2016   Procedure: TOTAL KNEE ARTHROPLASTY;  Surgeon: Frederico Hamman, MD;  Location: [facility] OR;  Service: Orthopedics;   Laterality: Right;     Current Outpatient Medications:    acetaminophen (TYLENOL) 500 MG tablet, Take 1,000 mg by mouth every 8 (eight) hours as needed for moderate pain or headache. , Disp: , Rfl:    amphetamine-dextroamphetamine (ADDERALL) 20 MG tablet, Take 20 mg by mouth in the morning, at noon, in the evening, and at bedtime., Disp: , Rfl:    buprenorphine (SUBUTEX) 8 MG SUBL SL tablet, Place 8 mg under the tongue in the morning, at noon, in the evening, and at bedtime., Disp: , Rfl:    ferrous sulfate 325 (65 FE) MG tablet, Take 325 mg by mouth 2 (two) times daily with a meal., Disp: , Rfl:    lamoTRIgine (LAMICTAL) 100 MG tablet, Take 100 mg by mouth at bedtime. , Disp: , Rfl:    levocetirizine (XYZAL) 5 MG tablet, Take 5 mg by mouth every evening., Disp: , Rfl:    magnesium oxide (MAG-OX) 400 MG tablet, Take 400 mg by mouth daily. (Patient not taking: Reported on 05/09/2020), Disp: , Rfl:    meclizine (ANTIVERT) 25 MG tablet, , Disp: , Rfl:    methocarbamol (ROBAXIN) 750 MG tablet, Take 750 mg by  mouth in the morning, at noon, in the evening, and at bedtime., Disp: , Rfl:    NUCYNTA ER 200 MG TB12, Take 200 mg by mouth in the morning and at bedtime., Disp: , Rfl:    oxymetazoline (AFRIN) 0.05 % nasal spray, Place 1 spray into both nostrils 4 (four) times daily as needed (nasal congestion). , Disp: , Rfl:    potassium chloride (KLOR-CON) 10 MEQ tablet, Take 10 mEq by mouth daily., Disp: , Rfl:    REXULTI 4 MG TABS, Take 4 mg by mouth daily., Disp: , Rfl:    sertraline (ZOLOFT) 100 MG tablet, Take 100 mg by mouth daily., Disp: , Rfl:    Tapentadol HCl (NUCYNTA) 100 MG TABS, Take 100 mg by mouth in the morning, at noon, in the evening, and at bedtime., Disp: , Rfl:    testosterone cypionate (DEPOTESTOSTERONE CYPIONATE) 200 MG/ML injection, Inject 200 mg into the muscle every 14 (fourteen) days., Disp: , Rfl:   Allergies  Allergen Reactions   Asa [Aspirin] Other (See Comments)     gastric bypass    Ibuprofen Other (See Comments)    gastric bypass   Penicillins Hives and Other (See Comments)    Has patient had a PCN reaction causing immediate rash, facial/tongue/throat swelling, SOB or lightheadedness with hypotension: Yes Has patient had a PCN reaction causing severe rash involving mucus membranes or skin necrosis: No Has patient had a PCN reaction that required hospitalization unknown "probably not" Has patient had a PCN reaction occurring within the last 10 years: No If all of the above answers are "NO", then may proceed with Cephalosporin use.   Adhesive [Tape] Rash and Other (See Comments)    Please use paper tape, can use cloth tape  States plastic bandaids irritate   Morphine And Related Nausea And Vomiting and Other (See Comments)    headaches          Objective:  Physical Exam  General: AAO x3, NAD  Dermatological: Nails appear to be hypertrophic, dystrophic with yellow, brown discoloration.  Slight incurvation of the nail borders there is no edema, erythema or signs of infection.  No open lesions.  Vascular: Dorsalis Pedis artery and Posterior Tibial artery pedal pulses are 2/4 bilateral with immedate capillary fill time.  There is no pain with calf compression, swelling, warmth, erythema.   Neruologic: Grossly intact via light touch bilateral.   Musculoskeletal: No gross boney pedal deformities bilateral. No pain, crepitus, or limitation noted with foot and ankle range of motion bilateral. Muscular strength 5/5 in all groups tested bilateral.  Gait: Unassisted, Nonantalgic.       Assessment:   Onychomycosis     Plan:  -Treatment options discussed including all alternatives, risks, and complications -Etiology of symptoms were discussed -Discussed nail removal with wants to hold off on this as is not causing discomfort and there is no signs of infection -Discussed treatment options for nail fungus including oral, topical as well as  alternative treatments.  At this time the patient wants to proceed with oral Lamisil.  We discussed side effects of the medication and success rates.  We will check a CBC and LFT prior to starting the medication.  Once I receive the results of this I will then call the medication in.   Vivi Barrack DPM

## 2022-02-26 ENCOUNTER — Other Ambulatory Visit: Payer: Self-pay | Admitting: Podiatry

## 2022-02-26 DIAGNOSIS — M4325 Fusion of spine, thoracolumbar region: Secondary | ICD-10-CM | POA: Diagnosis not present

## 2022-02-26 DIAGNOSIS — Z79899 Other long term (current) drug therapy: Secondary | ICD-10-CM

## 2022-02-26 LAB — CBC WITH DIFFERENTIAL/PLATELET
Absolute Monocytes: 504 cells/uL (ref 200–950)
Basophils Absolute: 79 cells/uL (ref 0–200)
Basophils Relative: 1.1 %
Eosinophils Absolute: 180 cells/uL (ref 15–500)
Eosinophils Relative: 2.5 %
HCT: 42.3 % (ref 38.5–50.0)
Hemoglobin: 14.3 g/dL (ref 13.2–17.1)
Lymphs Abs: 1404 cells/uL (ref 850–3900)
MCH: 30.6 pg (ref 27.0–33.0)
MCHC: 33.8 g/dL (ref 32.0–36.0)
MCV: 90.6 fL (ref 80.0–100.0)
MPV: 9.3 fL (ref 7.5–12.5)
Monocytes Relative: 7 %
Neutro Abs: 5033 cells/uL (ref 1500–7800)
Neutrophils Relative %: 69.9 %
Platelets: 292 10*3/uL (ref 140–400)
RBC: 4.67 10*6/uL (ref 4.20–5.80)
RDW: 13 % (ref 11.0–15.0)
Total Lymphocyte: 19.5 %
WBC: 7.2 10*3/uL (ref 3.8–10.8)

## 2022-02-26 LAB — HEPATIC FUNCTION PANEL
AG Ratio: 1.4 (calc) (ref 1.0–2.5)
ALT: 20 U/L (ref 9–46)
AST: 27 U/L (ref 10–35)
Albumin: 4.6 g/dL (ref 3.6–5.1)
Alkaline phosphatase (APISO): 104 U/L (ref 35–144)
Bilirubin, Direct: 0.1 mg/dL (ref 0.0–0.2)
Globulin: 3.3 g/dL (calc) (ref 1.9–3.7)
Indirect Bilirubin: 0.3 mg/dL (calc) (ref 0.2–1.2)
Total Bilirubin: 0.4 mg/dL (ref 0.2–1.2)
Total Protein: 7.9 g/dL (ref 6.1–8.1)

## 2022-02-26 MED ORDER — TERBINAFINE HCL 250 MG PO TABS: 250.0000 mg | ORAL_TABLET | Freq: Every day | ORAL | 0 refills | Status: DC

## 2022-03-07 DIAGNOSIS — Z0001 Encounter for general adult medical examination with abnormal findings: Secondary | ICD-10-CM | POA: Diagnosis not present

## 2022-03-27 DIAGNOSIS — M960 Pseudarthrosis after fusion or arthrodesis: Secondary | ICD-10-CM | POA: Diagnosis not present

## 2022-03-27 DIAGNOSIS — I10 Essential (primary) hypertension: Secondary | ICD-10-CM | POA: Diagnosis not present

## 2022-03-27 DIAGNOSIS — M4325 Fusion of spine, thoracolumbar region: Secondary | ICD-10-CM | POA: Diagnosis not present

## 2022-05-26 DIAGNOSIS — M4325 Fusion of spine, thoracolumbar region: Secondary | ICD-10-CM | POA: Diagnosis not present

## 2022-05-26 DIAGNOSIS — I10 Essential (primary) hypertension: Secondary | ICD-10-CM | POA: Diagnosis not present

## 2022-05-26 DIAGNOSIS — M960 Pseudarthrosis after fusion or arthrodesis: Secondary | ICD-10-CM | POA: Diagnosis not present

## 2022-05-27 ENCOUNTER — Ambulatory Visit: Payer: Medicare Other | Admitting: Podiatry

## 2022-05-27 DIAGNOSIS — B351 Tinea unguium: Secondary | ICD-10-CM

## 2022-05-27 DIAGNOSIS — L97511 Non-pressure chronic ulcer of other part of right foot limited to breakdown of skin: Secondary | ICD-10-CM | POA: Diagnosis not present

## 2022-05-27 DIAGNOSIS — Z79899 Other long term (current) drug therapy: Secondary | ICD-10-CM | POA: Diagnosis not present

## 2022-05-27 NOTE — Progress Notes (Signed)
Subjective: Chief Complaint  Patient presents with   Nail Problem    Nail fungus, Patient denies any pain  TX: Lamisil     56 year old male presents the office with above concerns.  No side affects of the medication. The right big toenail came off but it is growing back in. No pain, drainage, swelling or other signs of infection. No new concerns.   Objective: AAO x3, NAD DP/PT pulses palpable bilaterally, CRT less than 3 seconds Appears of the right hallux nail is growing out.  The distal portion of nail still hypertrophic, dystrophic nail, brown discoloration.  No edema, erythema or signs of infection.   There is a granular wound present on the fifth toe from where there was a blister.  The nail has come off as well.  No edema.  No drainage or pus.   No pain with calf compression, swelling, warmth, erythema   Assessment: Onychomycosis, currently on Lamisil; right fifth toe wound, onycholysis  Plan: -All treatment options discussed with the patient including all alternatives, risks, complications.  -Recommend antibiotic ointment dressing changes to the right fifth toe daily.  Monitor for any signs or symptoms of infection. -Continue lamisil 30 more days, rehceck blood work -Patient encouraged to call the office with any questions, concerns, change in symptoms.   Vivi Barrack DPM

## 2022-05-27 NOTE — Patient Instructions (Signed)
On the right 5th toe apply a small amount of antibiotic ointment and bandage. Watch for any signs of infection. If you notice any swelling, redness, drainage please let me know.   Terbinafine Tablets What is this medication? TERBINAFINE (TER bin a feen) treats fungal infections of the nails. It belongs to a group of medications called antifungals. It will not treat infections caused by bacteria or viruses. This medicine may be used for other purposes; ask your health care provider or pharmacist if you have questions. COMMON BRAND NAME(S): Lamisil, Terbinex What should I tell my care team before I take this medication? They need to know if you have any of these conditions: Liver disease An unusual or allergic reaction to terbinafine, other medications, foods, dyes, or preservatives Pregnant or trying to get pregnant Breast-feeding How should I use this medication? Take this medication by mouth with water. Take it as directed on the prescription label at the same time every day. You can take it with or without food. If it upsets your stomach, take it with food. Keep taking it unless your care team tells you to stop. A special MedGuide will be given to you by the pharmacist with each prescription and refill. Be sure to read this information carefully each time. Talk to your care team regarding the use of this medication in children. Special care may be needed. Overdosage: If you think you have taken too much of this medicine contact a poison control center or emergency room at once. NOTE: This medicine is only for you. Do not share this medicine with others. What if I miss a dose? If you miss a dose, take it as soon as you can unless it is more than 4 hours late. If it is more than 4 hours late, skip the missed dose. Take the next dose at the normal time. What may interact with this medication? Do not take this medication with any of the following: Pimozide Thioridazine This medication may also  interact with the following: Beta blockers Caffeine Certain medications for mental health conditions Cimetidine Cyclosporine Medications for fungal infections like fluconazole and ketoconazole Medications for irregular heartbeat like amiodarone, flecainide and propafenone Rifampin Warfarin This list may not describe all possible interactions. Give your health care provider a list of all the medicines, herbs, non-prescription drugs, or dietary supplements you use. Also tell them if you smoke, drink alcohol, or use illegal drugs. Some items may interact with your medicine. What should I watch for while using this medication? Visit your care team for regular checks on your progress. You may need blood work while you are taking this medication. It may be some time before you see the benefit from this medication. This medication may cause serious skin reactions. They can happen weeks to months after starting the medication. Contact your care team right away if you notice fevers or flu-like symptoms with a rash. The rash may be red or purple and then turn into blisters or peeling of the skin. Or, you might notice a red rash with swelling of the face, lips or lymph nodes in your neck or under your arms. This medication can make you more sensitive to the sun. Keep out of the sun, If you cannot avoid being in the sun, wear protective clothing and sunscreen. Do not use sun lamps or tanning beds/booths. What side effects may I notice from receiving this medication? Side effects that you should report to your care team as soon as possible: Allergic reactions--skin rash,  itching, hives, swelling of the face, lips, tongue, or throat Change in sense of smell Change in taste Infection--fever, chills, cough, or sore throat Liver injury--right upper belly pain, loss of appetite, nausea, light-colored stool, dark yellow or brown urine, yellowing skin or eyes, unusual weakness or fatigue Low red blood cell  level--unusual weakness or fatigue, dizziness, headache, trouble breathing Lupus-like syndrome--joint pain, swelling, or stiffness, butterfly-shaped rash on the face, rashes that get worse in the sun, fever, unusual weakness or fatigue Rash, fever, and swollen lymph nodes Redness, blistering, peeling, or loosening of the skin, including inside the mouth Unusual bruising or bleeding Worsening mood, feelings of depression Side effects that usually do not require medical attention (report to your care team if they continue or are bothersome): Diarrhea Gas Headache Nausea Stomach pain Upset stomach This list may not describe all possible side effects. Call your doctor for medical advice about side effects. You may report side effects to FDA at 1-800-FDA-1088. Where should I keep my medication? Keep out of the reach of children and pets. Store between 20 and 25 degrees C (68 and 77 degrees F). Protect from light. Get rid of any unused medication after the expiration date. To get rid of medications that are no longer needed or have expired: Take the medication to a medication take-back program. Check with your pharmacy or law enforcement to find a location. If you cannot return the medication, check the label or package insert to see if the medication should be thrown out in the garbage or flushed down the toilet. If you are not sure, ask your care team. If it is safe to put it in the trash, take the medication out of the container. Mix the medication with cat litter, dirt, coffee grounds, or other unwanted substance. Seal the mixture in a bag or container. Put it in the trash. NOTE: This sheet is a summary. It may not cover all possible information. If you have questions about this medicine, talk to your doctor, pharmacist, or health care provider.  2023 Elsevier/Gold Standard (2021-01-01 00:00:00)

## 2022-05-28 DIAGNOSIS — R946 Abnormal results of thyroid function studies: Secondary | ICD-10-CM | POA: Diagnosis not present

## 2022-05-28 DIAGNOSIS — E782 Mixed hyperlipidemia: Secondary | ICD-10-CM | POA: Diagnosis not present

## 2022-05-28 DIAGNOSIS — R7301 Impaired fasting glucose: Secondary | ICD-10-CM | POA: Diagnosis not present

## 2022-06-04 ENCOUNTER — Other Ambulatory Visit (HOSPITAL_COMMUNITY): Payer: Self-pay | Admitting: Internal Medicine

## 2022-06-04 DIAGNOSIS — M9959 Intervertebral disc stenosis of neural canal of abdomen and other regions: Secondary | ICD-10-CM | POA: Diagnosis not present

## 2022-06-04 DIAGNOSIS — G4733 Obstructive sleep apnea (adult) (pediatric): Secondary | ICD-10-CM | POA: Diagnosis not present

## 2022-06-04 DIAGNOSIS — R946 Abnormal results of thyroid function studies: Secondary | ICD-10-CM | POA: Diagnosis not present

## 2022-06-04 DIAGNOSIS — R7301 Impaired fasting glucose: Secondary | ICD-10-CM | POA: Diagnosis not present

## 2022-06-04 DIAGNOSIS — E782 Mixed hyperlipidemia: Secondary | ICD-10-CM

## 2022-06-04 DIAGNOSIS — Z23 Encounter for immunization: Secondary | ICD-10-CM | POA: Diagnosis not present

## 2022-06-04 DIAGNOSIS — D649 Anemia, unspecified: Secondary | ICD-10-CM | POA: Diagnosis not present

## 2022-06-04 DIAGNOSIS — G894 Chronic pain syndrome: Secondary | ICD-10-CM | POA: Diagnosis not present

## 2022-08-26 ENCOUNTER — Ambulatory Visit: Payer: Medicare Other | Admitting: Podiatry

## 2022-09-02 ENCOUNTER — Ambulatory Visit (HOSPITAL_COMMUNITY): Payer: No Typology Code available for payment source

## 2022-10-24 ENCOUNTER — Other Ambulatory Visit (HOSPITAL_COMMUNITY): Payer: No Typology Code available for payment source

## 2022-11-28 DIAGNOSIS — R7301 Impaired fasting glucose: Secondary | ICD-10-CM | POA: Diagnosis not present

## 2022-11-28 DIAGNOSIS — R946 Abnormal results of thyroid function studies: Secondary | ICD-10-CM | POA: Diagnosis not present

## 2022-11-28 DIAGNOSIS — E782 Mixed hyperlipidemia: Secondary | ICD-10-CM | POA: Diagnosis not present

## 2022-12-03 DIAGNOSIS — Z0001 Encounter for general adult medical examination with abnormal findings: Secondary | ICD-10-CM | POA: Diagnosis not present

## 2022-12-03 DIAGNOSIS — M960 Pseudarthrosis after fusion or arthrodesis: Secondary | ICD-10-CM | POA: Diagnosis not present

## 2022-12-03 DIAGNOSIS — Z0000 Encounter for general adult medical examination without abnormal findings: Secondary | ICD-10-CM | POA: Diagnosis not present

## 2022-12-03 DIAGNOSIS — M4325 Fusion of spine, thoracolumbar region: Secondary | ICD-10-CM | POA: Diagnosis not present

## 2022-12-04 DIAGNOSIS — G4733 Obstructive sleep apnea (adult) (pediatric): Secondary | ICD-10-CM | POA: Diagnosis not present

## 2022-12-04 DIAGNOSIS — R946 Abnormal results of thyroid function studies: Secondary | ICD-10-CM | POA: Diagnosis not present

## 2022-12-04 DIAGNOSIS — M4800 Spinal stenosis, site unspecified: Secondary | ICD-10-CM | POA: Diagnosis not present

## 2022-12-04 DIAGNOSIS — R7303 Prediabetes: Secondary | ICD-10-CM | POA: Diagnosis not present

## 2022-12-04 DIAGNOSIS — E782 Mixed hyperlipidemia: Secondary | ICD-10-CM | POA: Diagnosis not present

## 2022-12-04 DIAGNOSIS — G894 Chronic pain syndrome: Secondary | ICD-10-CM | POA: Diagnosis not present

## 2022-12-04 DIAGNOSIS — D649 Anemia, unspecified: Secondary | ICD-10-CM | POA: Diagnosis not present

## 2022-12-19 ENCOUNTER — Ambulatory Visit (HOSPITAL_COMMUNITY)
Admission: RE | Admit: 2022-12-19 | Discharge: 2022-12-19 | Disposition: A | Discharge status: Home / Self Care | Payer: No Typology Code available for payment source | Source: Ambulatory Visit | Attending: Internal Medicine | Admitting: Internal Medicine

## 2022-12-19 DIAGNOSIS — E782 Mixed hyperlipidemia: Secondary | ICD-10-CM | POA: Insufficient documentation

## 2023-01-30 DIAGNOSIS — I10 Essential (primary) hypertension: Secondary | ICD-10-CM | POA: Diagnosis not present

## 2023-01-30 DIAGNOSIS — U071 COVID-19: Secondary | ICD-10-CM | POA: Diagnosis not present

## 2023-02-17 ENCOUNTER — Ambulatory Visit: Payer: No Typology Code available for payment source | Attending: Internal Medicine | Admitting: Internal Medicine

## 2023-02-17 ENCOUNTER — Ambulatory Visit: Payer: No Typology Code available for payment source | Admitting: Internal Medicine

## 2023-02-17 ENCOUNTER — Telehealth: Payer: Self-pay | Admitting: Internal Medicine

## 2023-02-17 ENCOUNTER — Encounter: Payer: Self-pay | Admitting: Internal Medicine

## 2023-02-17 VITALS — BP 122/80 | HR 68 | Ht 70.0 in | Wt 305.4 lb

## 2023-02-17 DIAGNOSIS — R931 Abnormal findings on diagnostic imaging of heart and coronary circulation: Secondary | ICD-10-CM

## 2023-02-17 DIAGNOSIS — E785 Hyperlipidemia, unspecified: Secondary | ICD-10-CM | POA: Diagnosis not present

## 2023-02-17 DIAGNOSIS — R079 Chest pain, unspecified: Secondary | ICD-10-CM

## 2023-02-17 MED ORDER — ASPIRIN 81 MG PO TBEC: 81.0000 mg | DELAYED_RELEASE_TABLET | Freq: Every day | ORAL | 2 refills | Status: DC

## 2023-02-17 MED ORDER — ROSUVASTATIN CALCIUM 5 MG PO TABS: 5.0000 mg | ORAL_TABLET | Freq: Every day | ORAL | 2 refills | Status: DC

## 2023-02-17 MED ORDER — OMEPRAZOLE 20 MG PO CPDR: 20.0000 mg | DELAYED_RELEASE_CAPSULE | Freq: Every day | ORAL | 2 refills | Status: DC

## 2023-02-17 NOTE — Telephone Encounter (Signed)
Checking percert on the following patient for testing scheduled at [facility].    Lexiscan  02/24/2023

## 2023-02-17 NOTE — Patient Instructions (Signed)
Medication Instructions:  Your physician has recommended you make the following change in your medication:  Start taking Aspirin 81 mg once daily Omeprazole 20 mg once daily Rosuvastatin 5 mg at bedtime Continue taking all other medications as prescribed  Labwork: None  Testing/Procedures: Your physician has requested that you have a lexiscan myoview. For further information please visit [email or web address] Please follow instruction sheet, as given.   Follow-Up: Your physician recommends that you schedule a follow-up appointment in: 6 months  Any Other Special Instructions Will Be Listed Below (If Applicable).  If you need a refill on your cardiac medications before your next appointment, please call your pharmacy.

## 2023-02-17 NOTE — Progress Notes (Signed)
Cardiology Office Note  Date: 02/17/2023   ID: Mark [name], Mark [name] 13-Feb-1966, MRN [id number]  PCP:  Benita Stabile, MD  Cardiologist:  Marjo Bicker, MD Electrophysiologist:  None   History of Present Illness: Mark [name] is a 57 y.o. male with no significant PMH was referred to cardiology clinic for evaluation of elevated coronary calcium score.  Coronary calcium score is a 38, 97th percentile for age and sex matched control in 11/2022.  He reported having ongoing sporadic episodes of chest tightness once in 1 to 2 months.  No DOE, orthopnea, PND, leg swelling, dizziness, presyncope and syncope.  No family history of premature CAD.  Does not smoke cigarettes.  He has vertigo and mainly dizziness is from the vertigo.   Past Medical History:  Diagnosis Date   Anemia    low iron   Anxiety    Astigmatism of left eye    Depression    Fatigue    GERD (gastroesophageal reflux disease)    heartburn occ - better since having gastric bypass   Headache    migraines - none for years, sinus headaches at times   Hyperlipemia    Hypertension    no meds, 05/09/20- pt denies ever having had hypertension   Knee pain    Obesity, unspecified    Osteoarthritis    Pneumonia    as child    Shoulder pain    Sleep apnea    Trigger finger (acquired)    Unspecified sleep apnea    uses cpap    Past Surgical History:  Procedure Laterality Date   Back surgery x3     CARPAL TUNNEL RELEASE Bilateral    FRACTURE SURGERY     all fingers expect l thumb   GASTRIC BYPASS  2011   JOINT REPLACEMENT     KNEE ARTHROSCOPY Right    KNEE ARTHROSCOPY Left 11/15/14   SHOULDER SURGERY Left 2006   SHOULDER SURGERY Right 2012 and 2017   TOTAL HIP ARTHROPLASTY Right 08/05/2016   Procedure: TOTAL HIP ARTHROPLASTY ANTERIOR APPROACH;  Surgeon: Sheral Apley, MD;  Location: [facility] OR;  Service: Orthopedics;  Laterality: Right;   TOTAL KNEE ARTHROPLASTY Left 07/27/2015   Procedure: LEFT TOTAL KNEE  ARTHROPLASTY;  Surgeon: Frederico Hamman, MD;  Location: [facility] OR;  Service: Orthopedics;  Laterality: Left;   TOTAL KNEE ARTHROPLASTY Right 12/19/2016   Procedure: TOTAL KNEE ARTHROPLASTY;  Surgeon: Frederico Hamman, MD;  Location: [facility] OR;  Service: Orthopedics;  Laterality: Right;    Current Outpatient Medications  Medication Sig Dispense Refill   acetaminophen (TYLENOL) 500 MG tablet Take 1,000 mg by mouth every 8 (eight) hours as needed for moderate pain or headache.      amphetamine-dextroamphetamine (ADDERALL) 20 MG tablet Take 20 mg by mouth in the morning, at noon, and at bedtime.     aspirin EC 81 MG tablet Take 1 tablet (81 mg total) by mouth daily. Swallow whole. 90 tablet 2   buprenorphine (SUBUTEX) 8 MG SUBL SL tablet Place 8 mg under the tongue in the morning, at noon, in the evening, and at bedtime.     lamoTRIgine (LAMICTAL) 100 MG tablet Take 100 mg by mouth at bedtime.      levocetirizine (XYZAL) 5 MG tablet Take 5 mg by mouth every evening.     magnesium oxide (MAG-OX) 400 MG tablet Take 400 mg by mouth daily.     meclizine (ANTIVERT) 25 MG tablet  methocarbamol (ROBAXIN) 750 MG tablet Take 750 mg by mouth in the morning, at noon, in the evening, and at bedtime.     omeprazole (PRILOSEC) 20 MG capsule Take 1 capsule (20 mg total) by mouth daily. 90 capsule 2   oxymetazoline (AFRIN) 0.05 % nasal spray Place 1 spray into both nostrils 4 (four) times daily as needed (nasal congestion).      potassium chloride (KLOR-CON) 10 MEQ tablet Take 10 mEq by mouth daily.     REXULTI 4 MG TABS Take 4 mg by mouth daily.     rosuvastatin (CRESTOR) 5 MG tablet Take 1 tablet (5 mg total) by mouth at bedtime. 90 tablet 2   sertraline (ZOLOFT) 100 MG tablet Take 100 mg by mouth daily.     testosterone cypionate (DEPOTESTOSTERONE CYPIONATE) 200 MG/ML injection Inject 200 mg into the muscle every 14 (fourteen) days.     No current facility-administered medications for this visit.   Allergies:  Asa  [aspirin], Ibuprofen, Penicillins, Adhesive [tape], and Morphine and codeine   Social History: The patient  reports that he has never smoked. He quit smokeless tobacco use about 28 years ago.  His smokeless tobacco use included chew. He reports current alcohol use. He reports that he does not use drugs.   Family History: The patient's family history includes Arthritis in his father; COPD in his mother; Cancer in his father; Diabetes in his mother; Heart failure in his mother; High blood pressure in his father.   ROS:  Please see the history of present illness. Otherwise, complete review of systems is positive for none  All other systems are reviewed and negative.   Physical Exam: VS:  BP 122/80   Pulse 68   Ht 5\' 10"  (1.778 m)   Wt (!) 305 lb 6.4 oz (138.5 kg)   SpO2 95%   BMI 43.82 kg/m , BMI Body mass index is 43.82 kg/m.  Wt Readings from Last 3 Encounters:  02/17/23 (!) 305 lb 6.4 oz (138.5 kg)  05/09/20 (!) 307 lb (139.3 kg)  03/20/20 (!) 307 lb (139.3 kg)    General: Patient appears comfortable at rest. HEENT: Conjunctiva and lids normal, oropharynx clear with moist mucosa. Neck: Supple, no elevated JVP or carotid bruits, no thyromegaly. Lungs: Clear to auscultation, nonlabored breathing at rest. Cardiac: Regular rate and rhythm, no S3 or significant systolic murmur, no pericardial rub. Abdomen: Soft, nontender, no hepatomegaly, bowel sounds present, no guarding or rebound. Extremities: No pitting edema, distal pulses 2+. Skin: Warm and dry. Musculoskeletal: No kyphosis. Neuropsychiatric: Alert and oriented x3, affect grossly appropriate.  Recent Labwork: 02/25/2022: ALT 20; AST 27; Hemoglobin 14.3; Platelets 292  No results found for: "CHOL", "TRIG", "HDL", "CHOLHDL", "VLDL", "LDLCALC", "LDLDIRECT"  Other Studies Reviewed Today:   Assessment and Plan:  Elevated coronary calcium score 838 (97th percentile for age and sex matched control) and chest pain: Start aspirin 81  mg once daily, rosuvastatin 5 mg nightly (patient has muscle cramps and preferred the lowest dose possible).  Will add omeprazole as aspirin 81 will make him nauseous. He has sporadic chest tightness, once in 2 months and unable to recall the aggravating factors.  Recommend NM stress test to rule out any high risk findings.        Medication Adjustments/Labs and Tests Ordered: Current medicines are reviewed at length with the patient today.  Concerns regarding medicines are outlined above.    Disposition:  Follow up  6 months  Signed Mark Garza Verne Spurr, MD,  02/17/2023 2:43 PM    [facility] at [facility] [address] [place], [place], [place] [address]

## 2023-02-20 ENCOUNTER — Telehealth (HOSPITAL_COMMUNITY): Payer: Self-pay | Admitting: Emergency Medicine

## 2023-02-24 ENCOUNTER — Encounter (HOSPITAL_COMMUNITY): Payer: No Typology Code available for payment source

## 2023-03-06 DIAGNOSIS — Z23 Encounter for immunization: Secondary | ICD-10-CM | POA: Diagnosis not present

## 2023-03-06 DIAGNOSIS — Z713 Dietary counseling and surveillance: Secondary | ICD-10-CM | POA: Diagnosis not present

## 2023-03-06 DIAGNOSIS — G894 Chronic pain syndrome: Secondary | ICD-10-CM | POA: Diagnosis not present

## 2023-03-06 DIAGNOSIS — Z7182 Exercise counseling: Secondary | ICD-10-CM | POA: Diagnosis not present

## 2023-03-06 DIAGNOSIS — M9959 Intervertebral disc stenosis of neural canal of abdomen and other regions: Secondary | ICD-10-CM | POA: Diagnosis not present

## 2023-03-06 DIAGNOSIS — Z79899 Other long term (current) drug therapy: Secondary | ICD-10-CM | POA: Diagnosis not present

## 2023-03-09 ENCOUNTER — Encounter (HOSPITAL_COMMUNITY): Payer: No Typology Code available for payment source

## 2023-03-09 ENCOUNTER — Encounter (HOSPITAL_COMMUNITY): Payer: Medicare Other

## 2023-03-11 ENCOUNTER — Telehealth: Payer: Self-pay | Admitting: Internal Medicine

## 2023-03-11 NOTE — Telephone Encounter (Signed)
Checking percert on the following patient for testing scheduled at [facility].    LEXISCAN   03/19/2023

## 2023-03-18 ENCOUNTER — Telehealth (HOSPITAL_COMMUNITY): Payer: Self-pay | Admitting: Surgery

## 2023-03-18 NOTE — Telephone Encounter (Signed)
I called the pt to remind him of his Lexiscan stress test tomorrow at 9:30.  The pt was instructed to not eat or drink 6 hours prior to the test (no caffeine, no chocolate), to not smoke 8 hours prior to the test, to wear comfortable clothes and no lotions/oils, and to register at the front desk when he arrives.  The pt verbalized understanding of these instructions.

## 2023-03-19 ENCOUNTER — Encounter (HOSPITAL_COMMUNITY)
Admission: RE | Admit: 2023-03-19 | Discharge: 2023-03-19 | Disposition: A | Discharge status: Home / Self Care | Payer: Medicare Other | Source: Ambulatory Visit | Attending: Internal Medicine | Admitting: Internal Medicine

## 2023-03-19 ENCOUNTER — Encounter (HOSPITAL_COMMUNITY): Payer: Self-pay

## 2023-03-19 ENCOUNTER — Ambulatory Visit (HOSPITAL_COMMUNITY)
Admission: RE | Admit: 2023-03-19 | Discharge: 2023-03-19 | Disposition: A | Discharge status: Home / Self Care | Payer: No Typology Code available for payment source | Source: Ambulatory Visit | Attending: Internal Medicine | Admitting: Internal Medicine

## 2023-03-19 DIAGNOSIS — R079 Chest pain, unspecified: Secondary | ICD-10-CM | POA: Diagnosis not present

## 2023-03-19 LAB — NM MYOCAR MULTI W/SPECT W/WALL MOTION / EF
LV dias vol: 152 mL (ref 62–150)
LV sys vol: 66 mL
Nuc Stress EF: 57 %
Peak HR: 98 {beats}/min
RATE: 0.4
Rest HR: 76 {beats}/min
Rest Nuclear Isotope Dose: 10 mCi
SDS: 0
SRS: 7
SSS: 7
ST Depression (mm): 0 mm
Stress Nuclear Isotope Dose: 30.8 mCi
TID: 0.99

## 2023-03-19 MED ORDER — REGADENOSON 0.4 MG/5ML IV SOLN
INTRAVENOUS | Status: AC
  Administered 2023-03-19: 0.4 mg via INTRAVENOUS
  Filled 2023-03-19: qty 5

## 2023-03-19 MED ORDER — TECHNETIUM TC 99M TETROFOSMIN IV KIT
10.0000 | PACK | Freq: Once | INTRAVENOUS | Status: AC | PRN
  Administered 2023-03-19: 10 via INTRAVENOUS

## 2023-03-19 MED ORDER — TECHNETIUM TC 99M TETROFOSMIN IV KIT
30.0000 | PACK | Freq: Once | INTRAVENOUS | Status: AC | PRN
  Administered 2023-03-19: 30.8 via INTRAVENOUS

## 2023-03-19 MED ORDER — SODIUM CHLORIDE FLUSH 0.9 % IV SOLN
INTRAVENOUS | Status: AC
  Administered 2023-03-19: 10 mL via INTRAVENOUS
  Filled 2023-03-19: qty 10

## 2023-03-31 ENCOUNTER — Telehealth: Payer: Self-pay | Admitting: Internal Medicine

## 2023-03-31 MED ORDER — NITROGLYCERIN 0.4 MG SL SUBL: 0.4000 mg | SUBLINGUAL_TABLET | SUBLINGUAL | 2 refills | Status: AC | PRN

## 2023-03-31 NOTE — Telephone Encounter (Signed)
Calling with question about stress test. Please advise

## 2023-03-31 NOTE — Telephone Encounter (Signed)
-----   Message from Vishnu P Mallipeddi sent at 03/27/2023 12:25 PM EDT ----- Abnormal stress test but low risk findings.  Prescribe SL NTG 0.4 mg as needed for chest pains.  Provide ER precautions for chest pain.

## 2023-03-31 NOTE — Telephone Encounter (Signed)
Left detailed message regarding stress test.

## 2023-04-03 NOTE — Telephone Encounter (Signed)
Per Dr. Jenene Slicker response:  You might have had a heart attack in the past due to which a small area in your heart turned into a scar tissue Around this scar, there is some heart muscle that is not getting enough blood supply due to a blockage. This blockage would not be taken care of until the patient gets frequent chest pains or tightness or heaviness.  If patient has any more questions, need to schedule telephone or face to face encounter.   Patient informed and verbalized understanding of plan.

## 2023-06-12 DIAGNOSIS — R7303 Prediabetes: Secondary | ICD-10-CM | POA: Diagnosis not present

## 2023-06-12 DIAGNOSIS — R946 Abnormal results of thyroid function studies: Secondary | ICD-10-CM | POA: Diagnosis not present

## 2023-06-12 DIAGNOSIS — E782 Mixed hyperlipidemia: Secondary | ICD-10-CM | POA: Diagnosis not present

## 2023-06-18 ENCOUNTER — Encounter: Payer: Self-pay | Admitting: Internal Medicine

## 2023-06-18 DIAGNOSIS — R946 Abnormal results of thyroid function studies: Secondary | ICD-10-CM | POA: Diagnosis not present

## 2023-06-18 DIAGNOSIS — G4733 Obstructive sleep apnea (adult) (pediatric): Secondary | ICD-10-CM | POA: Diagnosis not present

## 2023-06-18 DIAGNOSIS — G894 Chronic pain syndrome: Secondary | ICD-10-CM | POA: Diagnosis not present

## 2023-06-18 DIAGNOSIS — R252 Cramp and spasm: Secondary | ICD-10-CM | POA: Diagnosis not present

## 2023-06-18 DIAGNOSIS — E782 Mixed hyperlipidemia: Secondary | ICD-10-CM | POA: Diagnosis not present

## 2023-06-18 DIAGNOSIS — M9959 Intervertebral disc stenosis of neural canal of abdomen and other regions: Secondary | ICD-10-CM | POA: Diagnosis not present

## 2023-06-18 DIAGNOSIS — D649 Anemia, unspecified: Secondary | ICD-10-CM | POA: Diagnosis not present

## 2023-06-18 DIAGNOSIS — R7303 Prediabetes: Secondary | ICD-10-CM | POA: Diagnosis not present

## 2023-07-01 DIAGNOSIS — M960 Pseudarthrosis after fusion or arthrodesis: Secondary | ICD-10-CM | POA: Diagnosis not present

## 2023-08-11 ENCOUNTER — Encounter: Payer: Self-pay | Admitting: Internal Medicine

## 2023-08-11 ENCOUNTER — Ambulatory Visit: Payer: Medicare Other | Attending: Internal Medicine | Admitting: Internal Medicine

## 2023-08-11 VITALS — BP 124/72 | HR 88 | Ht 70.0 in | Wt 299.2 lb

## 2023-08-11 DIAGNOSIS — I10 Essential (primary) hypertension: Secondary | ICD-10-CM | POA: Diagnosis not present

## 2023-08-11 DIAGNOSIS — R9439 Abnormal result of other cardiovascular function study: Secondary | ICD-10-CM | POA: Diagnosis not present

## 2023-08-11 DIAGNOSIS — Z01810 Encounter for preprocedural cardiovascular examination: Secondary | ICD-10-CM

## 2023-08-11 MED ORDER — METOPROLOL TARTRATE 100 MG PO TABS: 100.0000 mg | ORAL_TABLET | Freq: Once | ORAL | 0 refills | Status: AC

## 2023-08-11 NOTE — Progress Notes (Signed)
Cardiology Office Note  Date: 08/11/2023   ID: Mark [name], DOB 01-Oct-1965, MRN [id number]  PCP:  Benita Stabile, MD  Cardiologist:  Marjo Bicker, MD Electrophysiologist:  None   History of Present Illness: Mark [name] is a 58 y.o. male with no significant PMH is here for follow-up visit.  Coronary calcium score is 838, 97th percentile for age and sex matched control in 11/2022.  Respiratory episodes of chest tightness once in 1 to 2 months, he underwent NM stress test which showed findings consistent with prior inferior/inferoapical infarction with mild peri-infarct ischemia inferior apically.  The study was deemed to be low risk.  Patient is here for follow-up visit.  He had around 1-2 episodes of chest pain since last clinic visit and had to use sublingual nitroglycerin only 1 time.  He is not worried about his chest pains.  No other symptoms of DOE, dizziness, presyncope syncope, palpitations or leg swelling.  His main complaint is feeling tired and daytime excessive sleepiness.  He underwent home sleep study requested by his PCP at the [place].  It came back to be negative.  He does have history of OSA many many years ago.   Past Medical History:  Diagnosis Date   Anemia    low iron   Anxiety    Astigmatism of left eye    Depression    Fatigue    GERD (gastroesophageal reflux disease)    heartburn occ - better since having gastric bypass   Headache    migraines - none for years, sinus headaches at times   Hyperlipemia    Hypertension    no meds, 05/09/20- pt denies ever having had hypertension   Knee pain    Obesity, unspecified    Osteoarthritis    Pneumonia    as child    Shoulder pain    Sleep apnea    Trigger finger (acquired)    Unspecified sleep apnea    uses cpap    Past Surgical History:  Procedure Laterality Date   Back surgery x3     CARPAL TUNNEL RELEASE Bilateral    FRACTURE SURGERY     all fingers expect l thumb   GASTRIC BYPASS  2011    JOINT REPLACEMENT     KNEE ARTHROSCOPY Right    KNEE ARTHROSCOPY Left 11/15/14   SHOULDER SURGERY Left 2006   SHOULDER SURGERY Right 2012 and 2017   TOTAL HIP ARTHROPLASTY Right 08/05/2016   Procedure: TOTAL HIP ARTHROPLASTY ANTERIOR APPROACH;  Surgeon: Sheral Apley, MD;  Location: [facility] OR;  Service: Orthopedics;  Laterality: Right;   TOTAL KNEE ARTHROPLASTY Left 07/27/2015   Procedure: LEFT TOTAL KNEE ARTHROPLASTY;  Surgeon: Frederico Hamman, MD;  Location: [facility] OR;  Service: Orthopedics;  Laterality: Left;   TOTAL KNEE ARTHROPLASTY Right 12/19/2016   Procedure: TOTAL KNEE ARTHROPLASTY;  Surgeon: Frederico Hamman, MD;  Location: [facility] OR;  Service: Orthopedics;  Laterality: Right;    Current Outpatient Medications  Medication Sig Dispense Refill   acetaminophen (TYLENOL) 500 MG tablet Take 1,000 mg by mouth every 8 (eight) hours as needed for moderate pain or headache.      amphetamine-dextroamphetamine (ADDERALL) 20 MG tablet Take 20 mg by mouth in the morning, at noon, and at bedtime.     aspirin EC 81 MG tablet Take 1 tablet (81 mg total) by mouth daily. Swallow whole. 90 tablet 2   buprenorphine (SUBUTEX) 8 MG SUBL SL tablet Place 8 mg under the  tongue in the morning, at noon, in the evening, and at bedtime.     lamoTRIgine (LAMICTAL) 100 MG tablet Take 100 mg by mouth at bedtime.      levocetirizine (XYZAL) 5 MG tablet Take 5 mg by mouth every evening.     magnesium oxide (MAG-OX) 400 MG tablet Take 400 mg by mouth daily.     methocarbamol (ROBAXIN) 750 MG tablet Take 750 mg by mouth in the morning, at noon, in the evening, and at bedtime.     nitroGLYCERIN (NITROSTAT) 0.4 MG SL tablet Place 1 tablet (0.4 mg total) under the tongue every 5 (five) minutes x 3 doses as needed for chest pain (if no relief after 3rd dose proceed to ED or call 911). 25 tablet 2   omeprazole (PRILOSEC) 20 MG capsule Take 1 capsule (20 mg total) by mouth daily. 90 capsule 2   oxymetazoline (AFRIN) 0.05 % nasal spray  Place 1 spray into both nostrils 4 (four) times daily as needed (nasal congestion).      potassium chloride (KLOR-CON) 10 MEQ tablet Take 10 mEq by mouth daily.     REXULTI 4 MG TABS Take 4 mg by mouth daily.     rosuvastatin (CRESTOR) 5 MG tablet Take 1 tablet (5 mg total) by mouth at bedtime. 90 tablet 2   sertraline (ZOLOFT) 100 MG tablet Take 100 mg by mouth daily.     testosterone cypionate (DEPOTESTOSTERONE CYPIONATE) 200 MG/ML injection Inject 200 mg into the muscle every 14 (fourteen) days.     No current facility-administered medications for this visit.   Allergies:  Asa [aspirin], Ibuprofen, Penicillins, Adhesive [tape], and Morphine and codeine   Social History: The patient  reports that he has never smoked. He quit smokeless tobacco use about 29 years ago.  His smokeless tobacco use included chew. He reports current alcohol use. He reports that he does not use drugs.   Family History: The patient's family history includes Arthritis in his father; COPD in his mother; Cancer in his father; Diabetes in his mother; Heart failure in his mother; High blood pressure in his father.   ROS:  Please see the history of present illness. Otherwise, complete review of systems is positive for none  All other systems are reviewed and negative.   Physical Exam: VS:  BP 124/72   Pulse 88   Ht 5\' 10"  (1.778 m)   Wt 299 lb 3.2 oz (135.7 kg)   SpO2 95%   BMI 42.93 kg/m , BMI Body mass index is 42.93 kg/m.  Wt Readings from Last 3 Encounters:  08/11/23 299 lb 3.2 oz (135.7 kg)  02/17/23 (!) 305 lb 6.4 oz (138.5 kg)  05/09/20 (!) 307 lb (139.3 kg)    General: Patient appears comfortable at rest. HEENT: Conjunctiva and lids normal, oropharynx clear with moist mucosa. Neck: Supple, no elevated JVP or carotid bruits, no thyromegaly. Lungs: Clear to auscultation, nonlabored breathing at rest. Cardiac: Regular rate and rhythm, no S3 or significant systolic murmur, no pericardial rub. Abdomen:  Soft, nontender, no hepatomegaly, bowel sounds present, no guarding or rebound. Extremities: No pitting edema, distal pulses 2+. Skin: Warm and dry. Musculoskeletal: No kyphosis. Neuropsychiatric: Alert and oriented x3, affect grossly appropriate.  Recent Labwork: No results found for requested labs within last 365 days.  No results found for: "CHOL", "TRIG", "HDL", "CHOLHDL", "VLDL", "LDLCALC", "LDLDIRECT"    Assessment and Plan:  Elevated coronary calcium score 838 (97th percentile for age and sex matched control) Positive  cardiac stress test (Prior inferior/infra apical infarction with mild peri-infarct ischemia inferior apically), low risk " Chest pain HLD, at goal History of OSA   -No interval chest pain except that he had to use sublingual nitroglycerin only on 1 occasion.  He had a positive cardiac stress test, will obtain CT cardiac to define the coronary anatomy.  Continue cardioprotective medications, aspirin 81 mg once daily, rosuvastatin 5 mg p.o. at bedtime omeprazole was prescribed in the last clinic visit in addition to aspirin as aspirin makes him feel nauseous.  He underwent home sleep study recently due to symptoms of severe fatigue and daytime excessive sleepiness.  Home study was negative for OSA, [facility]  Instructed him to [facility] to get in lab sleep study for definitive diagnosis of OSA.  He verbalized understanding.     Medication Adjustments/Labs and Tests Ordered: Current medicines are reviewed at length with the patient today.  Concerns regarding medicines are outlined above.    Disposition:  Follow up 1 year  Signed Mynor Witkop Verne Spurr, MD, 08/11/2023 8:56 AM    [facility] at [facility] [address] [place], [place], [place] [address]

## 2023-08-11 NOTE — Patient Instructions (Addendum)
Medication Instructions:  Your physician recommends that you continue on your current medications as directed. Please refer to the Current Medication list given to you today.   Labwork: BMET to be completed 1-2 weeks prior to Coronary CTA at [facility]  Testing/Procedures:   Your cardiac CT will be scheduled at one of the below locations:   [facility] [address] [place], [place] [address] [phone]  If scheduled at [facility], please arrive at the [facility] Entrance (Entrance C2) of [facility] 30 minutes prior to test start time. You can use the FREE valet parking offered at entrance C (encouraged to control the heart rate for the test)  Proceed to the [facility] Radiology Department (first floor) to check-in and test prep.  All radiology patients and guests should use entrance C2 at [facility], accessed from [facility], even though the hospital's physical address listed is [address].    If scheduled at [facility], please arrive 30 minutes early for check-in and test prep.  Please follow these instructions carefully (unless otherwise directed):  An IV will be required for this test and Nitroglycerin will be given.   On the Night Before the Test: Be sure to Drink plenty of water. Do not consume any caffeinated/decaffeinated beverages or chocolate 12 hours prior to your test. Do not take any antihistamines 12 hours prior to your test. If the patient has contrast allergy: No allergy  On the Day of the Test: Drink plenty of water until 1 hour prior to the test. Do not eat any food 1 hour prior to test. You may take your regular medications prior to the test.  Take metoprolol (Lopressor) two hours prior to test. Potassium Chloride, please HOLD on the morning of the test. Patients who wear a continuous glucose monitor MUST remove the device prior to scanning.      After the  Test: Drink plenty of water. After receiving IV contrast, you may experience a mild flushed feeling. This is normal. On occasion, you may experience a mild rash up to 24 hours after the test. This is not dangerous. If this occurs, you can take Benadryl 25 mg, Zyrtec, Claritin, or Allegra and increase your fluid intake. (Patients taking Tikosyn should avoid Benadryl, and may take Zyrtec, Claritin, or Allegra) If you experience trouble breathing, this can be serious. If it is severe call 911 IMMEDIATELY. If it is mild, please call our office.  We will call to schedule your test 2-4 weeks out understanding that some insurance companies will need an authorization prior to the service being performed.   For more information and frequently asked questions, please visit our website : [email or web address]  For non-scheduling related questions, please contact the cardiac imaging nurse navigator should you have any questions/concerns: Cardiac Imaging Nurse Navigators Direct Office Dial: [phone]   For scheduling needs, including cancellations and rescheduling, please call [place], [phone].   Follow-Up: Your physician recommends that you schedule a follow-up appointment in: 1 year. You will receive a reminder call in about 8 months reminding you to schedule your appointment. If you don't receive this call, please contact our office.   Any Other Special Instructions Will Be Listed Below (If Applicable). Thank you for choosing [facility]!     If you need a refill on your cardiac medications before your next appointment, please call your pharmacy.

## 2023-08-26 ENCOUNTER — Telehealth (HOSPITAL_COMMUNITY): Payer: Self-pay | Admitting: *Deleted

## 2023-08-26 NOTE — Telephone Encounter (Signed)
 Reaching out to patient to offer assistance regarding upcoming cardiac imaging study; pt verbalizes understanding of appt date/time, parking situation and where to check in, pre-test NPO status and medications ordered, and verified current allergies; name and call back number provided for further questions should they arise  Larey Brick RN Navigator Cardiac Imaging [facility] [phone] office [phone] cell  Patient to hold his adderall and take 100mg  metoprolol tartrate two hours prior to his cardiac CT scan. He is aware to arrive at 10:30 AM.

## 2023-08-27 ENCOUNTER — Ambulatory Visit (HOSPITAL_COMMUNITY)
Admission: RE | Admit: 2023-08-27 | Discharge: 2023-08-27 | Disposition: A | Discharge status: Home / Self Care | Payer: No Typology Code available for payment source | Source: Ambulatory Visit | Attending: Internal Medicine | Admitting: Internal Medicine

## 2023-08-27 DIAGNOSIS — I2510 Atherosclerotic heart disease of native coronary artery without angina pectoris: Secondary | ICD-10-CM | POA: Diagnosis not present

## 2023-08-27 DIAGNOSIS — R9430 Abnormal result of cardiovascular function study, unspecified: Secondary | ICD-10-CM

## 2023-08-27 DIAGNOSIS — R9439 Abnormal result of other cardiovascular function study: Secondary | ICD-10-CM | POA: Diagnosis not present

## 2023-08-27 MED ORDER — NITROGLYCERIN 0.4 MG SL SUBL
SUBLINGUAL_TABLET | SUBLINGUAL | Status: AC
  Filled 2023-08-27: qty 2

## 2023-08-27 MED ORDER — IOHEXOL 350 MG/ML SOLN
95.0000 mL | Freq: Once | INTRAVENOUS | Status: AC | PRN
  Administered 2023-08-27: 95 mL via INTRAVENOUS

## 2023-08-27 MED ORDER — NITROGLYCERIN 0.4 MG SL SUBL
0.8000 mg | SUBLINGUAL_TABLET | Freq: Once | SUBLINGUAL | Status: AC
  Administered 2023-08-27: 0.8 mg via SUBLINGUAL

## 2023-09-01 ENCOUNTER — Ambulatory Visit: Payer: Medicare Other | Admitting: Podiatry

## 2023-09-15 ENCOUNTER — Ambulatory Visit: Admitting: Podiatry

## 2023-09-22 DIAGNOSIS — E782 Mixed hyperlipidemia: Secondary | ICD-10-CM | POA: Diagnosis not present

## 2023-09-22 DIAGNOSIS — R946 Abnormal results of thyroid function studies: Secondary | ICD-10-CM | POA: Diagnosis not present

## 2023-09-22 DIAGNOSIS — R7303 Prediabetes: Secondary | ICD-10-CM | POA: Diagnosis not present

## 2023-09-25 ENCOUNTER — Ambulatory Visit: Admitting: Podiatry

## 2023-09-28 DIAGNOSIS — G4733 Obstructive sleep apnea (adult) (pediatric): Secondary | ICD-10-CM | POA: Diagnosis not present

## 2023-09-28 DIAGNOSIS — I25110 Atherosclerotic heart disease of native coronary artery with unstable angina pectoris: Secondary | ICD-10-CM | POA: Diagnosis not present

## 2023-09-28 DIAGNOSIS — R946 Abnormal results of thyroid function studies: Secondary | ICD-10-CM | POA: Diagnosis not present

## 2023-09-28 DIAGNOSIS — I2510 Atherosclerotic heart disease of native coronary artery without angina pectoris: Secondary | ICD-10-CM | POA: Diagnosis not present

## 2023-09-28 DIAGNOSIS — E782 Mixed hyperlipidemia: Secondary | ICD-10-CM | POA: Diagnosis not present

## 2023-09-28 DIAGNOSIS — G894 Chronic pain syndrome: Secondary | ICD-10-CM | POA: Diagnosis not present

## 2023-09-28 DIAGNOSIS — M9959 Intervertebral disc stenosis of neural canal of abdomen and other regions: Secondary | ICD-10-CM | POA: Diagnosis not present

## 2023-09-28 DIAGNOSIS — D649 Anemia, unspecified: Secondary | ICD-10-CM | POA: Diagnosis not present

## 2023-10-12 ENCOUNTER — Ambulatory Visit: Admitting: Podiatry

## 2023-11-23 ENCOUNTER — Other Ambulatory Visit: Payer: Self-pay | Admitting: Internal Medicine

## 2023-11-29 DIAGNOSIS — J00 Acute nasopharyngitis [common cold]: Secondary | ICD-10-CM | POA: Diagnosis not present

## 2023-11-29 DIAGNOSIS — J029 Acute pharyngitis, unspecified: Secondary | ICD-10-CM | POA: Diagnosis not present

## 2023-12-03 ENCOUNTER — Encounter (HOSPITAL_BASED_OUTPATIENT_CLINIC_OR_DEPARTMENT_OTHER): Payer: Self-pay | Admitting: Internal Medicine

## 2023-12-03 DIAGNOSIS — R0681 Apnea, not elsewhere classified: Secondary | ICD-10-CM

## 2023-12-03 DIAGNOSIS — R0683 Snoring: Secondary | ICD-10-CM

## 2023-12-29 ENCOUNTER — Ambulatory Visit (HOSPITAL_BASED_OUTPATIENT_CLINIC_OR_DEPARTMENT_OTHER): Payer: Self-pay | Admitting: Internal Medicine

## 2024-01-15 DIAGNOSIS — R946 Abnormal results of thyroid function studies: Secondary | ICD-10-CM | POA: Diagnosis not present

## 2024-01-15 DIAGNOSIS — E782 Mixed hyperlipidemia: Secondary | ICD-10-CM | POA: Diagnosis not present

## 2024-01-15 DIAGNOSIS — R7303 Prediabetes: Secondary | ICD-10-CM | POA: Diagnosis not present

## 2024-01-20 DIAGNOSIS — G894 Chronic pain syndrome: Secondary | ICD-10-CM | POA: Diagnosis not present

## 2024-01-20 DIAGNOSIS — E782 Mixed hyperlipidemia: Secondary | ICD-10-CM | POA: Diagnosis not present

## 2024-01-20 DIAGNOSIS — R7303 Prediabetes: Secondary | ICD-10-CM | POA: Diagnosis not present

## 2024-01-20 DIAGNOSIS — D649 Anemia, unspecified: Secondary | ICD-10-CM | POA: Diagnosis not present

## 2024-01-20 DIAGNOSIS — R946 Abnormal results of thyroid function studies: Secondary | ICD-10-CM | POA: Diagnosis not present

## 2024-01-20 DIAGNOSIS — M9959 Intervertebral disc stenosis of neural canal of abdomen and other regions: Secondary | ICD-10-CM | POA: Diagnosis not present

## 2024-01-20 DIAGNOSIS — I25110 Atherosclerotic heart disease of native coronary artery with unstable angina pectoris: Secondary | ICD-10-CM | POA: Diagnosis not present

## 2024-01-20 DIAGNOSIS — G4733 Obstructive sleep apnea (adult) (pediatric): Secondary | ICD-10-CM | POA: Diagnosis not present

## 2024-01-26 NOTE — Procedures (Signed)
 Orders only

## 2024-02-02 ENCOUNTER — Encounter (HOSPITAL_BASED_OUTPATIENT_CLINIC_OR_DEPARTMENT_OTHER): Admitting: Internal Medicine

## 2024-02-05 ENCOUNTER — Ambulatory Visit (HOSPITAL_BASED_OUTPATIENT_CLINIC_OR_DEPARTMENT_OTHER): Admitting: Internal Medicine

## 2024-03-27 ENCOUNTER — Ambulatory Visit (HOSPITAL_BASED_OUTPATIENT_CLINIC_OR_DEPARTMENT_OTHER): Admitting: Internal Medicine

## 2024-03-27 ENCOUNTER — Ambulatory Visit (HOSPITAL_BASED_OUTPATIENT_CLINIC_OR_DEPARTMENT_OTHER): Attending: Internal Medicine | Admitting: Internal Medicine
